# Patient Record
Sex: Female | Born: 1955
Health system: Southern US, Community
[De-identification: ages and names within clinical notes are randomized; demographics above are authoritative.]

## PROBLEM LIST (undated history)

## (undated) DIAGNOSIS — E039 Hypothyroidism, unspecified: Secondary | ICD-10-CM

## (undated) DIAGNOSIS — E669 Obesity, unspecified: Secondary | ICD-10-CM

## (undated) DIAGNOSIS — E119 Type 2 diabetes mellitus without complications: Secondary | ICD-10-CM

## (undated) DIAGNOSIS — I10 Essential (primary) hypertension: Secondary | ICD-10-CM

## (undated) DIAGNOSIS — J40 Bronchitis, not specified as acute or chronic: Secondary | ICD-10-CM

## (undated) DIAGNOSIS — H269 Unspecified cataract: Secondary | ICD-10-CM

## (undated) DIAGNOSIS — M1990 Unspecified osteoarthritis, unspecified site: Secondary | ICD-10-CM

## (undated) DIAGNOSIS — B0229 Other postherpetic nervous system involvement: Secondary | ICD-10-CM

## (undated) DIAGNOSIS — E559 Vitamin D deficiency, unspecified: Secondary | ICD-10-CM

## (undated) DIAGNOSIS — E079 Disorder of thyroid, unspecified: Secondary | ICD-10-CM

## (undated) DIAGNOSIS — E785 Hyperlipidemia, unspecified: Secondary | ICD-10-CM

## (undated) HISTORY — DX: Unspecified osteoarthritis, unspecified site: M19.90

## (undated) HISTORY — DX: Unspecified cataract: H26.9

## (undated) HISTORY — PX: BREAST SURGERY: SHX581

## (undated) HISTORY — DX: Other postherpetic nervous system involvement: B02.29

## (undated) HISTORY — PX: ABDOMINAL HYSTERECTOMY: SHX81

## (undated) HISTORY — DX: Hyperlipidemia, unspecified: E78.5

---
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
## 2000-10-14 ENCOUNTER — Emergency Department (HOSPITAL_COMMUNITY): Admission: EM | Admit: 2000-10-14 | Discharge: 2000-10-14 | Payer: Self-pay | Admitting: *Deleted

## 2001-06-20 ENCOUNTER — Emergency Department (HOSPITAL_COMMUNITY): Admission: EM | Admit: 2001-06-20 | Discharge: 2001-06-20 | Payer: Self-pay | Admitting: Emergency Medicine

## 2001-10-16 ENCOUNTER — Emergency Department (HOSPITAL_COMMUNITY): Admission: EM | Admit: 2001-10-16 | Discharge: 2001-10-16 | Payer: Self-pay | Admitting: Emergency Medicine

## 2002-07-21 ENCOUNTER — Emergency Department (HOSPITAL_COMMUNITY): Admission: EM | Admit: 2002-07-21 | Discharge: 2002-07-21 | Payer: Self-pay | Admitting: Emergency Medicine

## 2002-12-30 ENCOUNTER — Emergency Department (HOSPITAL_COMMUNITY): Admission: EM | Admit: 2002-12-30 | Discharge: 2002-12-30 | Payer: Self-pay | Admitting: Emergency Medicine

## 2003-01-27 ENCOUNTER — Emergency Department (HOSPITAL_COMMUNITY): Admission: EM | Admit: 2003-01-27 | Discharge: 2003-01-27 | Payer: Self-pay | Admitting: Emergency Medicine

## 2003-01-27 ENCOUNTER — Encounter: Payer: Self-pay | Admitting: Emergency Medicine

## 2004-01-17 ENCOUNTER — Emergency Department (HOSPITAL_COMMUNITY): Admission: EM | Admit: 2004-01-17 | Discharge: 2004-01-17 | Payer: Self-pay | Admitting: Emergency Medicine

## 2004-09-11 ENCOUNTER — Emergency Department (HOSPITAL_COMMUNITY): Admission: EM | Admit: 2004-09-11 | Discharge: 2004-09-11 | Payer: Self-pay | Admitting: Emergency Medicine

## 2006-05-05 ENCOUNTER — Emergency Department (HOSPITAL_COMMUNITY): Admission: EM | Admit: 2006-05-05 | Discharge: 2006-05-05 | Payer: Self-pay | Admitting: Emergency Medicine

## 2006-10-24 ENCOUNTER — Emergency Department (HOSPITAL_COMMUNITY): Admission: EM | Admit: 2006-10-24 | Discharge: 2006-10-24 | Payer: Self-pay | Admitting: Emergency Medicine

## 2008-04-22 ENCOUNTER — Encounter (HOSPITAL_COMMUNITY): Admission: RE | Admit: 2008-04-22 | Discharge: 2008-05-22 | Payer: Self-pay | Admitting: Family Medicine

## 2009-01-17 ENCOUNTER — Inpatient Hospital Stay (HOSPITAL_COMMUNITY): Admission: RE | Admit: 2009-01-17 | Discharge: 2009-01-20 | Payer: Self-pay | Admitting: *Deleted

## 2009-01-17 ENCOUNTER — Ambulatory Visit: Payer: Self-pay | Admitting: *Deleted

## 2009-01-17 ENCOUNTER — Emergency Department (HOSPITAL_COMMUNITY): Admission: EM | Admit: 2009-01-17 | Discharge: 2009-01-17 | Payer: Self-pay | Admitting: Emergency Medicine

## 2009-03-15 ENCOUNTER — Emergency Department (HOSPITAL_COMMUNITY): Admission: EM | Admit: 2009-03-15 | Discharge: 2009-03-15 | Payer: Self-pay | Admitting: Emergency Medicine

## 2010-03-01 ENCOUNTER — Emergency Department (HOSPITAL_COMMUNITY)
Admission: EM | Admit: 2010-03-01 | Discharge: 2010-03-01 | Payer: Self-pay | Source: Home / Self Care | Admitting: Emergency Medicine

## 2010-05-09 ENCOUNTER — Ambulatory Visit (HOSPITAL_COMMUNITY)
Admission: RE | Admit: 2010-05-09 | Discharge: 2010-05-09 | Payer: Self-pay | Source: Home / Self Care | Attending: Family Medicine | Admitting: Family Medicine

## 2010-05-24 ENCOUNTER — Ambulatory Visit (HOSPITAL_COMMUNITY)
Admission: RE | Admit: 2010-05-24 | Discharge: 2010-05-24 | Payer: Self-pay | Source: Home / Self Care | Attending: Family Medicine | Admitting: Family Medicine

## 2010-05-29 ENCOUNTER — Ambulatory Visit (HOSPITAL_COMMUNITY)
Admission: RE | Admit: 2010-05-29 | Discharge: 2010-05-29 | Payer: Self-pay | Source: Home / Self Care | Attending: Family Medicine | Admitting: Family Medicine

## 2010-08-26 LAB — URINE MICROSCOPIC-ADD ON

## 2010-08-26 LAB — HEPATIC FUNCTION PANEL
Albumin: 3.4 g/dL — ABNORMAL LOW (ref 3.5–5.2)
Total Protein: 7.7 g/dL (ref 6.0–8.3)

## 2010-08-26 LAB — DIFFERENTIAL
Basophils Absolute: 0 10*3/uL (ref 0.0–0.1)
Basophils Relative: 1 % (ref 0–1)
Monocytes Relative: 9 % (ref 3–12)
Neutro Abs: 3.2 10*3/uL (ref 1.7–7.7)
Neutrophils Relative %: 44 % (ref 43–77)

## 2010-08-26 LAB — URINALYSIS, ROUTINE W REFLEX MICROSCOPIC
Nitrite: NEGATIVE
Protein, ur: NEGATIVE mg/dL
Urobilinogen, UA: 0.2 mg/dL (ref 0.0–1.0)

## 2010-08-26 LAB — POCT I-STAT, CHEM 8
BUN: 10 mg/dL (ref 6–23)
Chloride: 104 mEq/L (ref 96–112)
Potassium: 3.8 mEq/L (ref 3.5–5.1)
Sodium: 140 mEq/L (ref 135–145)

## 2010-08-26 LAB — RAPID URINE DRUG SCREEN, HOSP PERFORMED
Amphetamines: NOT DETECTED
Opiates: NOT DETECTED
Tetrahydrocannabinol: NOT DETECTED

## 2010-08-26 LAB — SALICYLATE LEVEL: Salicylate Lvl: <4 mg/dL (ref 2.8–20.0)

## 2010-08-26 LAB — CBC
MCHC: 33.3 g/dL (ref 30.0–36.0)
RBC: 4.91 MIL/uL (ref 3.87–5.11)
RDW: 13.3 % (ref 11.5–15.5)

## 2010-10-03 NOTE — H&P (Signed)
NAMEMarland Kitchen  Jeanette Compton, Jeanette Compton                ACCOUNT NO.:  [id number]   MEDICAL RECORD NO.:  [id number]          PATIENT TYPE:  IPS   LOCATION:  0302                          FACILITY:  [facility]   PHYSICIAN:  Jasmine Pang, M.D. DATE OF BIRTH:  10-06-1955   DATE OF ADMISSION:  01/17/2009  DATE OF DISCHARGE:                       PSYCHIATRIC ADMISSION ASSESSMENT   TIME:  10:05 a.m.   IDENTIFYING INFORMATION:  A 55 year old female.  This is a voluntary  admission.   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  Came to the emergency room yesterday because one of her  friends brought her, saying that she needed some help.  Apparently her  speech was somewhat slurred and she was rather somnolent in triage.  There were concerns about her medication management.  This morning she  is fully alert, in no distress.  Denies any misuse of her medication.  Denies any use of alcohol or street drugs.  She admits to having  depression and some fleeting suicidal thoughts occasionally.  Says she  has no intent to harm herself.  Does not feel that her depression is  that bad, not sure she needs to be here.   PAST PSYCHIATRIC HISTORY:  Followed at [facility] in  [place], [place] for about 3-4 months now.  Was started on  Zoloft 50 mg daily along with Remeron 30 mg daily and seen last week,  with Zoloft increased to 100 mg daily.  Also started on Gabapentin 300  mg t.i.d. for her anxiety.  This is her first episode of treatment.  No  previous hospitalizations.  No previous suicide attempts.  No previous  history of psychotropics.  Cites onset of depression after becoming  unemployed in 2008 due to on-the-job injury.   SOCIAL HISTORY:  A 55 year old African American female previously  married 3 times.  Divorced now since 2009.  Has been living with her  daughter since she fell twice at  work in 2008.  No income or work since  2008.  Has applied for disability and but not yet awarded.  Educated  through the tenth grade.  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  She reports that she is very active in her church,  attends weekly, and this is her primary support for her depression.   FAMILY HISTORY:  Two brothers who committed suicide.   MEDICAL HISTORY:  No regular primary care Jeanette Compton.   MEDICAL PROBLEMS:  Chronic back pain, vitiligo.   PAST MEDICAL HISTORY:  Hysterectomy, bronchitis, questionable history of  thyroid disorder.   CURRENT MEDICATIONS:  Gabapentin 300 mg t.i.d., Remeron 30 mg q.h.s.,  Zoloft 100 mg daily, also taken at h.s. due to drowsiness.   DRUG ALLERGIES:  None.   PHYSICAL EXAM:  Was done in the emergency room, as noted in the record,  a fully alert female with normal vital signs.   Chemistry panel  unremarkable.  Urine drug screen remarkable for a  moderate hemoglobin, RBCs 3-6, epithelials and bacteria rare.  Alcohol  level less than 5.  CBC normal.  Urine drug screen is negative.   MENTAL STATUS EXAM:  A fully alert female.  Adequate eye contact.  Grooming is good.  Makeup is on.  She is still dressed in hospital  scrubs from the emergency room.  Initially she is in bed.  Moves slowly  with log-rolling position to guard her back.  Gait is stable.  Motor is  normal.  Speech nonpressured.  Gives a coherent history.  Mood is  depressed.  Admits that she has depressed mood, low energy, does not  feel like she can do anything.  Reports that she is not doing much  around the house, sits all day long.  Admits to having some fleeting  suicidal thoughts in the past but has not thought of suicide recently.  No concerns that she would harm herself.  Cognition completely  preserved.  Insight is adequate.  Memory intact.   DIAGNOSES:  AXIS I:  Depressive  disorder not otherwise specified.  AXIS II:  No diagnosis.  AXIS III:  Chronic back pain.  Vitiligo.  AXIS IV:  Severe issues with unemployment, financial pressures.  AXIS V:  Current is 59.  Past year 53 estimated.   PLAN:  Voluntarily admit her to evaluate her mood and suicidality.  We  are not quite sure what is going on with her at this point.  We are  going to contact her daughter see if there are any concerns about her  safety at home, and will discharge her to continue with outpatient  followup with [facility] if her daughter feels that she is safe to go.  Meanwhile, will continue to evaluate her level of consciousness.  She  has been fully alert here with no slurred speech.      Margaret A. Lorin Picket, N.P.      Jasmine Pang, M.D.  Electronically Signed    MAS/MEDQ  D:  01/18/2009  T:  01/18/2009  Job:  [id number]

## 2010-10-03 NOTE — Discharge Summary (Signed)
NAME:  Jeanette Compton, Jeanette Compton NO.:  [id number]   MEDICAL RECORD NO.:  [id number]          PATIENT TYPE:  IPS   LOCATION:  0302                          FACILITY:  [facility]   PHYSICIAN:  Jasmine Pang, M.D. DATE OF BIRTH:  [birth year]   DATE OF ADMISSION:  01/17/2009  DATE OF DISCHARGE:  01/20/2009                               DISCHARGE SUMMARY   IDENTIFYING INFORMATION:  This is a 55 year old divorced African  American female who was admitted on a voluntary basis on January 17, 2009.   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  She came into the emergency room on the day before  admission because one of her friends brought her, saying that she needed  some help.  Apparently, her speech was somewhat slurred and she was  rather somnolent in triage.  There were concerns about her medication  management.  This morning, she is fully alert and in no acute distress.  She denies any misuse of her medication.  She denies any use of alcohol  or street drugs.  She admits to having depression and some fleeting  suicidal thoughts occasionally.  She says she has no intent to harm  herself.  She does not feel that her depression is that bad.  She is  not sure she needs to be here.  She is followed at [facility] in [place], [place].  She has been there for about 3-4  months.  She was started on Zoloft 50 mg daily along with Remeron 30 mg  daily.  She was also started on gabapentin 300 mg t.i.d. for her  anxiety.  This is her first episode of treatment.  No previous  hospitalizations.  No previous suicide attempts.  No previous history of  psychotropics.  She cites onset of depression after becoming unemployed  in 2008 due to on the job injury.  For further admission information,  see psychiatric admission assessment. On admission, she was given a  depressive disorder, NOS diagnosis on Axis I.  On Axis III, she was  given a chronic back pain and vitiligo diagnosis.   PHYSICAL FINDINGS:  Physical exam was done in the emergency room and  noted in the record.  This is a fully alert female, who was normal.   DIAGNOSTIC STUDIES:  Chemistry panel was unremarkable.  Urine drug  screen was remarkable for moderate hemoglobin.  RBCs were 3 to 9.  Epithelial cells and bacteria were rare.  Alcohol level was less than 5.  CBC was normal.  Urine drug screen was negative.   HOSPITAL COURSE:  Upon admission, the patient was started on gabapentin  300 mg p.o. q.i.d. and Remeron 30 mg q.h.s.  Her Zoloft was increased to  100 mg p.o. q. day.  In individual sessions, the patient was initially  disheveled with fair eye contact.  There was psychomotor retardation.  Speech was soft and slow.  She was drowsy and lethargic, and lying in  bed  when I first met her.  Mood was depressed and anxious.  Affect was  consistent with mood.  There was passive suicidal ideation.  No  homicidal ideation.  No evidence of psychosis or thought disorder.  As  hospitalization progressed, the patient became somewhat more active and  began to participate in unit therapeutic groups and therapy.  On  January 19, 2009, she was less depressed and less anxious.  Her  daughter had visited the night before and was very supportive.  She  lives with her daughter and feels comfortable returning to home.  On  January 20, 2009, sleep was good.  She was dressed casually and neatly  with good eye contact.  Speech was normal rate and flow.  Psychomotor  activity was within normal limits.  Mood was less depressed and less  anxious.  Affect was consistent with mood.  There was no suicidal or  homicidal ideation.  No thoughts of self injurious behavior.  No  auditory or visual hallucinations.  No paranoia or delusions.  Thoughts  were logical and goal-directed.  Thought content, no  predominant theme.  Cognitive was grossly intact.  Insight good.  Judgment good.  Impulse  control good and the patient was having no side effects to her  medication.  She wanted to go home today to live with her daughter and  she was felt to be safe for discharge.   DISCHARGE DIAGNOSES:  Axis I:  Depressive disorder, not otherwise  specified.  Axis II: None.  Axis III: Chronic back pain, vitiligo.  Axis IV: Severe (issues with unemployment, financial pressures, burden  of psychiatric illness, burden of medical problems).  Axis V:  Global assessment of functioning 50 upon discharge.  Global  assessment of functioning was 35 upon admission.  Global assessment of  functioning highest past year was 66.   DISCHARGE PLAN:  There were no specific activity level or dietary  restrictions.   POSTHOSPITAL CARE PLANS:  The patient will go to [facility] [place] on  January 25, 2009 at 8 o'clock a.m.   DISCHARGE MEDICATIONS:  1. Neurontin 300 mg 4 times daily.  2. Zoloft 100 mg daily.  3. Remeron 30 mg at bedtime.      Jasmine Pang, M.D.  Electronically Signed     BHS/MEDQ  D:  01/20/2009  T:  01/21/2009  Job:  [id number]

## 2010-11-03 ENCOUNTER — Other Ambulatory Visit (HOSPITAL_COMMUNITY): Payer: Self-pay | Admitting: Family Medicine

## 2010-11-03 DIAGNOSIS — Z09 Encounter for follow-up examination after completed treatment for conditions other than malignant neoplasm: Secondary | ICD-10-CM

## 2010-12-06 ENCOUNTER — Ambulatory Visit (HOSPITAL_COMMUNITY)
Admission: RE | Admit: 2010-12-06 | Discharge: 2010-12-06 | Disposition: A | Discharge status: Home / Self Care | Payer: Medicaid Other | Source: Ambulatory Visit | Attending: Family Medicine | Admitting: Family Medicine

## 2010-12-06 DIAGNOSIS — N6489 Other specified disorders of breast: Secondary | ICD-10-CM | POA: Insufficient documentation

## 2010-12-06 DIAGNOSIS — Z09 Encounter for follow-up examination after completed treatment for conditions other than malignant neoplasm: Secondary | ICD-10-CM | POA: Insufficient documentation

## 2011-01-19 ENCOUNTER — Emergency Department (HOSPITAL_COMMUNITY)
Admission: EM | Admit: 2011-01-19 | Discharge: 2011-01-19 | Disposition: A | Discharge status: Home / Self Care | Payer: Medicaid Other | Attending: Emergency Medicine | Admitting: Emergency Medicine

## 2011-01-19 ENCOUNTER — Emergency Department (HOSPITAL_COMMUNITY): Payer: Medicaid Other

## 2011-01-19 ENCOUNTER — Encounter: Payer: Self-pay | Admitting: *Deleted

## 2011-01-19 DIAGNOSIS — I10 Essential (primary) hypertension: Secondary | ICD-10-CM | POA: Insufficient documentation

## 2011-01-19 DIAGNOSIS — E0590 Thyrotoxicosis, unspecified without thyrotoxic crisis or storm: Secondary | ICD-10-CM | POA: Insufficient documentation

## 2011-01-19 DIAGNOSIS — J40 Bronchitis, not specified as acute or chronic: Secondary | ICD-10-CM | POA: Insufficient documentation

## 2011-01-19 DIAGNOSIS — J189 Pneumonia, unspecified organism: Secondary | ICD-10-CM | POA: Insufficient documentation

## 2011-01-19 HISTORY — DX: Bronchitis, not specified as acute or chronic: J40

## 2011-01-19 HISTORY — DX: Essential (primary) hypertension: I10

## 2011-01-19 MED ORDER — AZITHROMYCIN 250 MG PO TABS: ORAL_TABLET | ORAL | Status: DC

## 2011-01-19 MED ORDER — AZITHROMYCIN 250 MG PO TABS
500.0000 mg | ORAL_TABLET | Freq: Once | ORAL | Status: AC
  Administered 2011-01-19: 500 mg via ORAL
  Filled 2011-01-19: qty 2

## 2011-01-19 MED ORDER — HYDROCOD POLST-CHLORPHEN POLST 10-8 MG/5ML PO LQCR
5.0000 mL | Freq: Once | ORAL | Status: AC
  Administered 2011-01-19: 5 mL via ORAL

## 2011-01-19 MED ORDER — HYDROCOD POLST-CHLORPHEN POLST 10-8 MG/5ML PO LQCR
ORAL | Status: AC
  Filled 2011-01-19: qty 5

## 2011-01-19 MED ORDER — CEFTRIAXONE SODIUM 1 G IJ SOLR
1.0000 g | Freq: Once | INTRAMUSCULAR | Status: AC
  Administered 2011-01-19: 1 g via INTRAMUSCULAR
  Filled 2011-01-19: qty 1

## 2011-01-19 MED ORDER — HYDROCOD POLST-CHLORPHEN POLST 10-8 MG/5ML PO LQCR: 5.0000 mL | Freq: Two times a day (BID) | ORAL | Status: DC

## 2011-01-19 NOTE — ED Provider Notes (Signed)
History     CSN: [id number] Arrival date & time: 01/19/2011  8:32 PM  Chief Complaint  Patient presents with  . Cough   Patient is a 55 y.o. female presenting with cough. The history is provided by the patient. No language interpreter was used.  Cough This is a new problem. The current episode started 2 days ago. The problem occurs constantly. The problem has been gradually worsening. The cough is non-productive. The maximum temperature recorded prior to her arrival was 100 to 100.9 F. The fever has been present for 1 to 2 days. Associated symptoms include chest pain and shortness of breath.    Past Medical History  Diagnosis Date  . Hypertension   . Bronchitis   . Hyperthyroidism     Past Surgical History  Procedure Date  . Abdominal hysterectomy   . Breast surgery     No family history on file.  History  Substance Use Topics  . Smoking status: Never Smoker   . Smokeless tobacco: Not on file  . Alcohol Use: No    OB History    Grav Para Term Preterm Abortions TAB SAB Ect Mult Living                  Review of Systems  Constitutional: Positive for fever and fatigue.  Respiratory: Positive for cough and shortness of breath.   Cardiovascular: Positive for chest pain.    Physical Exam  BP 146/84  Pulse 81  Temp(Src) 100.7 F (38.2 C) (Oral)  Resp 20  Ht 5\' 6"  (1.676 m)  Wt 198 lb (89.812 kg)  BMI 31.96 kg/m2  SpO2 99%  Physical Exam  Nursing note and vitals reviewed. Constitutional: She is oriented to person, place, and time. Vital signs are normal. She appears well-developed and well-nourished. No distress.  HENT:  Head: Normocephalic and atraumatic.  Right Ear: External ear normal.  Left Ear: External ear normal.  Nose: Nose normal.  Mouth/Throat: No oropharyngeal exudate.  Eyes: Conjunctivae and EOM are normal. Pupils are equal, round, and reactive to light. Right eye exhibits no discharge. Left eye exhibits no discharge. No scleral icterus.  Neck:  Normal range of motion. Neck supple. No JVD present. No tracheal deviation present. No thyromegaly present.  Cardiovascular: Normal rate, regular rhythm, normal heart sounds, intact distal pulses and normal pulses.  Exam reveals no gallop and no friction rub.   No murmur heard. Pulmonary/Chest: Effort normal. No stridor. No respiratory distress. She has no wheezes. She has no rales.   She exhibits no tenderness.       Rhonchi at R base.  Abdominal: Soft. Normal appearance and bowel sounds are normal. She exhibits no distension and no mass. There is no tenderness. There is no rebound and no guarding.  Musculoskeletal: Normal range of motion. She exhibits no edema and no tenderness.       Arms: Lymphadenopathy:    She has no cervical adenopathy.  Neurological: She is alert and oriented to person, place, and time. She has normal reflexes. No cranial nerve deficit. Coordination normal. GCS eye subscore is 4. GCS verbal subscore is 5. GCS motor subscore is 6.  Skin: Skin is warm and dry. No rash noted. She is not diaphoretic.  Psychiatric: She has a normal mood and affect. Her speech is normal and behavior is normal. Judgment and thought content normal. Cognition and memory are normal.    ED Course  Procedures  MDM       Worthy Rancher,  PA 01/19/11 2221

## 2011-01-19 NOTE — ED Notes (Signed)
Productive cough for 2 days with chest pains when she coughs

## 2011-01-20 NOTE — ED Provider Notes (Signed)
Medical screening examination/treatment/procedure(s) were performed by non-physician practitioner and as supervising physician I was immediately available for consultation/collaboration.  Glynn Octave, MD 01/20/11 [phone]

## 2011-03-08 LAB — URINE MICROSCOPIC-ADD ON

## 2011-03-08 LAB — URINALYSIS, ROUTINE W REFLEX MICROSCOPIC
Bilirubin Urine: NEGATIVE
Glucose, UA: NEGATIVE
Ketones, ur: NEGATIVE
Nitrite: POSITIVE — AB
Protein, ur: NEGATIVE
Specific Gravity, Urine: 1.023
Urobilinogen, UA: 0.2
pH: 5.5

## 2011-03-08 LAB — I-STAT 8, (EC8 V) (CONVERTED LAB)
Acid-base deficit: 1
BUN: 11
Bicarbonate: 24.7 — ABNORMAL HIGH
Chloride: 108
Glucose, Bld: 111 — ABNORMAL HIGH
HCT: 43
Hemoglobin: 14.6
Operator id: 146091
Potassium: 3.7
Sodium: 140
TCO2: 26
pCO2, Ven: 43.4 — ABNORMAL LOW
pH, Ven: 7.363 — ABNORMAL HIGH

## 2011-03-08 LAB — POCT I-STAT CREATININE
Creatinine, Ser: 0.7
Operator id: 146091

## 2011-04-03 ENCOUNTER — Other Ambulatory Visit (HOSPITAL_COMMUNITY): Payer: Self-pay | Admitting: Family Medicine

## 2011-04-03 DIAGNOSIS — Z139 Encounter for screening, unspecified: Secondary | ICD-10-CM

## 2011-05-18 ENCOUNTER — Ambulatory Visit (HOSPITAL_COMMUNITY)
Admission: RE | Admit: 2011-05-18 | Discharge: 2011-05-18 | Disposition: A | Discharge status: Home / Self Care | Payer: Medicare Other | Source: Ambulatory Visit | Attending: Family Medicine | Admitting: Family Medicine

## 2011-05-18 DIAGNOSIS — Z1231 Encounter for screening mammogram for malignant neoplasm of breast: Secondary | ICD-10-CM | POA: Insufficient documentation

## 2011-05-18 DIAGNOSIS — Z139 Encounter for screening, unspecified: Secondary | ICD-10-CM

## 2011-07-18 ENCOUNTER — Encounter (INDEPENDENT_AMBULATORY_CARE_PROVIDER_SITE_OTHER): Payer: Self-pay | Admitting: *Deleted

## 2011-07-25 ENCOUNTER — Encounter (INDEPENDENT_AMBULATORY_CARE_PROVIDER_SITE_OTHER): Payer: Self-pay | Admitting: *Deleted

## 2011-07-25 ENCOUNTER — Other Ambulatory Visit (INDEPENDENT_AMBULATORY_CARE_PROVIDER_SITE_OTHER): Payer: Self-pay | Admitting: *Deleted

## 2011-07-25 ENCOUNTER — Telehealth (INDEPENDENT_AMBULATORY_CARE_PROVIDER_SITE_OTHER): Payer: Self-pay | Admitting: *Deleted

## 2011-07-25 DIAGNOSIS — Z1211 Encounter for screening for malignant neoplasm of colon: Secondary | ICD-10-CM

## 2011-07-25 MED ORDER — PEG-KCL-NACL-NASULF-NA ASC-C 100 G PO SOLR: 1.0000 | Freq: Once | ORAL | Status: DC

## 2011-07-25 NOTE — Telephone Encounter (Signed)
Patient needs movi prep 

## 2011-08-27 ENCOUNTER — Telehealth (INDEPENDENT_AMBULATORY_CARE_PROVIDER_SITE_OTHER): Payer: Self-pay | Admitting: *Deleted

## 2011-08-27 NOTE — Telephone Encounter (Signed)
Requesting MD: Janna Arch  PCP:       Name & DOB: Marlaine Hind  Jun 23, 2055     Procedure: tcs  Reason/Indication:  screening  Has patient had this procedure before?  no  If so, when, by whom and where?    Is there a family history of colon cancer?  no  Who?  What age when diagnosed?    Is patient diabetic?   no      Does patient have prosthetic heart valve?  no  Do you have a pacemaker?  no  Has patient had joint replacement within last 12 months?  no  Is patient on Coumadin, Plavix and/or Aspirin? no  Medications: lisinopril/hctz 20/12.5 mg daily, travastatin 40 mg daily  Allergies: nkda  Pharmacy: [facility]  Medication Adjustment:   Procedure date & time: 09/13/11 at 1030

## 2011-08-29 NOTE — Telephone Encounter (Signed)
agree

## 2011-09-12 MED ORDER — SODIUM CHLORIDE 0.45 % IV SOLN
Freq: Once | INTRAVENOUS | Status: AC
  Administered 2011-09-13: 10:00:00 via INTRAVENOUS

## 2011-09-13 ENCOUNTER — Ambulatory Visit (HOSPITAL_COMMUNITY)
Admission: RE | Admit: 2011-09-13 | Discharge: 2011-09-13 | Disposition: A | Discharge status: Home / Self Care | Payer: Medicare Other | Source: Ambulatory Visit | Attending: Internal Medicine | Admitting: Internal Medicine

## 2011-09-13 ENCOUNTER — Encounter (HOSPITAL_COMMUNITY): Payer: Self-pay | Admitting: *Deleted

## 2011-09-13 ENCOUNTER — Encounter (HOSPITAL_COMMUNITY)
Admission: RE | Disposition: A | Discharge status: Home / Self Care | Payer: Self-pay | Source: Ambulatory Visit | Attending: Internal Medicine

## 2011-09-13 DIAGNOSIS — D126 Benign neoplasm of colon, unspecified: Secondary | ICD-10-CM

## 2011-09-13 DIAGNOSIS — Z79899 Other long term (current) drug therapy: Secondary | ICD-10-CM | POA: Insufficient documentation

## 2011-09-13 DIAGNOSIS — Z1211 Encounter for screening for malignant neoplasm of colon: Secondary | ICD-10-CM | POA: Insufficient documentation

## 2011-09-13 DIAGNOSIS — K5730 Diverticulosis of large intestine without perforation or abscess without bleeding: Secondary | ICD-10-CM | POA: Insufficient documentation

## 2011-09-13 DIAGNOSIS — I10 Essential (primary) hypertension: Secondary | ICD-10-CM | POA: Insufficient documentation

## 2011-09-13 HISTORY — PX: COLONOSCOPY: SHX5424

## 2011-09-13 SURGERY — COLONOSCOPY
Anesthesia: Moderate Sedation

## 2011-09-13 MED ORDER — MEPERIDINE HCL 50 MG/ML IJ SOLN
INTRAMUSCULAR | Status: DC | PRN
  Administered 2011-09-13 (×2): 25 mg via INTRAVENOUS

## 2011-09-13 MED ORDER — STERILE WATER FOR IRRIGATION IR SOLN
Status: DC | PRN
  Administered 2011-09-13: 11:00:00

## 2011-09-13 MED ORDER — MIDAZOLAM HCL 5 MG/5ML IJ SOLN
INTRAMUSCULAR | Status: AC
  Filled 2011-09-13: qty 10

## 2011-09-13 MED ORDER — MEPERIDINE HCL 50 MG/ML IJ SOLN
INTRAMUSCULAR | Status: AC
  Filled 2011-09-13: qty 1

## 2011-09-13 MED ORDER — MIDAZOLAM HCL 5 MG/5ML IJ SOLN
INTRAMUSCULAR | Status: DC | PRN
  Administered 2011-09-13 (×2): 2 mg via INTRAVENOUS
  Administered 2011-09-13: 1 mg via INTRAVENOUS
  Administered 2011-09-13: 2 mg via INTRAVENOUS

## 2011-09-13 NOTE — Op Note (Signed)
COLONOSCOPY PROCEDURE REPORT  PATIENT:  Jeanette Compton  MR#:  [id number] Birthdate:  1955-07-16, 56 y.o., female Endoscopist:  Dr. Malissa Hippo, MD Referred By:  Dr. Isabella Stalling,, M.D. Procedure Date: 09/13/2011  Procedure:   Colonoscopy with snare polypectomy.  Indications:  screening for colon cancer.  Informed Consent:  The procedure and risks were reviewed with the patient and informed consent was obtained.  Medications:  Demerol 50 mg IV Versed 7 mg IV  Description of procedure:  After a digital rectal exam was performed, that colonoscope was advanced from the anus through the rectum and colon to the area of the cecum, ileocecal valve and appendiceal orifice. The cecum was deeply intubated. These structures were well-seen and photographed for the record. From the level of the cecum and ileocecal valve, the scope was slowly and cautiously withdrawn. The mucosal surfaces were carefully surveyed utilizing scope tip to flexion to facilitate fold flattening as needed. The scope was pulled down into the rectum where a thorough exam including retroflexion was performed.  Findings:   Prep satisfactory. 8 mm polyp snared from ascending colon. We millimeter polyp at hepatic flexure was coagulated using snare tip. 2 diverticula noted at the ascending colon. Normal rectal mucosa and single small anal papilla.  Therapeutic/Diagnostic Maneuvers Performed:  See above  Complications:  None  Cecal Withdrawal Time:  14 minutes  Impression:  Examination performed to cecum. 8 mm  polyp snared from ascending colon. Small polyp hepatic flexure was coagulated. Twio diverticula at  ascending colon  Recommendations:  Standard instructions given. I would be contacting patient with results of biopsy and further recommendations.   Conley Delisle U  09/13/2011 12:03 PM  CC: Dr. Isabella Stalling, MD, MD & Dr. Bonnetta Barry ref. provider found

## 2011-09-13 NOTE — Op Note (Signed)
COLONOSCOPY PROCEDURE REPORT  PATIENT:  Jeanette Compton  MR#:  [id number] Birthdate:  [birth year], 56 y.o., female Endoscopist:  Dr. Malissa Hippo, MD Referred By:  Dr. Isabella Stalling,, M.D. Procedure Date: 09/13/2011  Procedure:   Colonoscopy with snare polypectomy.  Indications:  screening for colon cancer.  Informed Consent:  The procedure and risks were reviewed with the patient and informed consent was obtained.  Medications:  Demerol 50 mg IV Versed 7 mg IV  Description of procedure:  After a digital rectal exam was performed, that colonoscope was advanced from the anus through the rectum and colon to the area of the cecum, ileocecal valve and appendiceal orifice. The cecum was deeply intubated. These structures were well-seen and photographed for the record. From the level of the cecum and ileocecal valve, the scope was slowly and cautiously withdrawn. The mucosal surfaces were carefully surveyed utilizing scope tip to flexion to facilitate fold flattening as needed. The scope was pulled down into the rectum where a thorough exam including retroflexion was performed.  Findings:   Prep satisfactory. 8 mm polyp snared from ascending colon. We millimeter polyp at hepatic flexure was coagulated using snare tip. 2 diverticula noted at the ascending colon. Normal rectal mucosa and single small anal papilla.  Therapeutic/Diagnostic Maneuvers Performed:  See above  Complications:  None  Cecal Withdrawal Time:  14 minutes  Impression:  Examination performed to cecum. 8 mm  polyp snared from ascending colon. Small polyp hepatic flexure was coagulated. Twio diverticula at the ascending colon  Recommendations:  Standard instructions given. I would be contacting patient with results of biopsy and further recommendations.   [name]  09/13/2011 11:53 AM  CC: Dr. Isabella Stalling, MD, MD & Dr. Bonnetta Barry ref. provider found

## 2011-09-13 NOTE — Discharge Instructions (Signed)
No aspirin for one week. Resume medications and diet. No driving for 24 hours. Physician and contact you with the biopsy results.Diverticulosis Diverticulosis is a common condition that develops when small pouches (diverticula) form in the wall of the colon. The risk of diverticulosis increases with age. It happens more often in people who eat a low-fiber diet. Most individuals with diverticulosis have no symptoms. Those individuals with symptoms usually experience abdominal pain, constipation, or loose stools (diarrhea). HOME CARE INSTRUCTIONS   Increase the amount of fiber in your diet as directed by your caregiver or dietician. This may reduce symptoms of diverticulosis.   Your caregiver may recommend taking a dietary fiber supplement.   Drink at least 6 to 8 glasses of water each day to prevent constipation.   Try not to strain when you have a bowel movement.   Your caregiver may recommend avoiding nuts and seeds to prevent complications, although this is still an uncertain benefit.   Only take over-the-counter or prescription medicines for pain, discomfort, or fever as directed by your caregiver.  FOODS WITH HIGH FIBER CONTENT INCLUDE:  Fruits. Apple, peach, pear, tangerine, raisins, prunes.   Vegetables. Brussels sprouts, asparagus, broccoli, cabbage, carrot, cauliflower, romaine lettuce, spinach, summer squash, tomato, winter squash, zucchini.   Starchy Vegetables. Baked beans, kidney beans, lima beans, split peas, lentils, potatoes (with skin).   Grains. Whole wheat bread, brown rice, bran flake cereal, plain oatmeal, white rice, shredded wheat, bran muffins.  SEEK IMMEDIATE MEDICAL CARE IF:   You develop increasing pain or severe bloating.   You have an oral temperature above 102 F (38.9 C), not controlled by medicine.   You develop vomiting or bowel movements that are bloody or black.  Document Released: 02/02/2004 Document Revised: 04/26/2011 Document Reviewed:  10/05/2009 [facility] Patient Information 2012 [place], [place].Colon Polyps A polyp is extra tissue that grows inside your body. Colon polyps grow in the large intestine. The large intestine, also called the colon, is part of your digestive system. It is a long, hollow tube at the end of your digestive tract where your body makes and stores stool. Most polyps are not dangerous. They are benign. This means they are not cancerous. But over time, some types of polyps can turn into cancer. Polyps that are smaller than a pea are usually not harmful. But larger polyps could someday become or may already be cancerous. To be safe, doctors remove all polyps and test them.  WHO GETS POLYPS? Anyone can get polyps, but certain people are more likely than others. You may have a greater chance of getting polyps if:  You are over 50.   You have had polyps before.   Someone in your family has had polyps.   Someone in your family has had cancer of the large intestine.   Find out if someone in your family has had polyps. You may also be more likely to get polyps if you:   Eat a lot of fatty foods.   Smoke.   Drink alcohol.   Do not exercise.   Eat too much.  SYMPTOMS  Most small polyps do not cause symptoms. People often do not know they have one until their caregiver finds it during a regular checkup or while testing them for something else. Some people do have symptoms like these:  Bleeding from the anus. You might notice blood on your underwear or on toilet paper after you have had a bowel movement.   Constipation or diarrhea that lasts more than  a week.   Blood in the stool. Blood can make stool look black or it can show up as red streaks in the stool.  If you have any of these symptoms, see your caregiver. HOW DOES THE DOCTOR TEST FOR POLYPS? The doctor can use four tests to check for polyps:  Digital rectal exam. The caregiver wears gloves and checks your rectum (the last part of the large  intestine) to see if it feels normal. This test would find polyps only in the rectum. Your caregiver may need to do one of the other tests listed below to find polyps higher up in the intestine.   Barium enema. The caregiver puts a liquid called barium into your rectum before taking x-rays of your large intestine. Barium makes your intestine look white in the pictures. Polyps are dark, so they are easy to see.   Sigmoidoscopy. With this test, the caregiver can see inside your large intestine. A thin flexible tube is placed into your rectum. The device is called a sigmoidoscope, which has a light and a tiny video camera in it. The caregiver uses the sigmoidoscope to look at the last third of your large intestine.   Colonoscopy. This test is like sigmoidoscopy, but the caregiver looks at all of the large intestine. It usually requires sedation. This is the most common method for finding and removing polyps.  TREATMENT   The caregiver will remove the polyp during sigmoidoscopy or colonoscopy. The polyp is then tested for cancer.   If you have had polyps, your caregiver may want you to get tested regularly in the future.  PREVENTION  There is not one sure way to prevent polyps. You might be able to lower your risk of getting them if you:  Eat more fruits and vegetables and less fatty food.   Do not smoke.   Avoid alcohol.   Exercise every day.   Lose weight if you are overweight.   Eating more calcium and folate can also lower your risk of getting polyps. Some foods that are rich in calcium are milk, cheese, and broccoli. Some foods that are rich in folate are chickpeas, kidney beans, and spinach.   Aspirin might help prevent polyps. Studies are under way.  Document Released: 02/01/2004 Document Revised: 04/26/2011 Document Reviewed: 07/09/2007 [facility] Patient Information 2012 [place], [place].

## 2011-09-13 NOTE — H&P (Signed)
Jeanette Compton is an 56 y.o. female.   Chief Complaint: Patient is here for colonoscopy HPI: Patient is 56 year old female who is in for average risk screening colonoscopy. Patient denies abdominal pain change in her bowel habits or rectal bleeding. Family history is positive for colon carcinoma in one second cousin.  Past Medical History  Diagnosis Date  . Hypertension   . Bronchitis   . Hyperthyroidism     Past Surgical History  Procedure Date  . Abdominal hysterectomy   . Breast surgery     Family History  Problem Relation Age of Onset  . Colon cancer Neg Hx    Social History:  reports that she has quit smoking. She does not have any smokeless tobacco history on file. She reports that she does not drink alcohol or use illicit drugs.  Allergies: No Known Allergies  Medications Prior to Admission  Medication Sig Dispense Refill  . LISINOPRIL PO Take 1 tablet by mouth daily.        . Multiple Vitamins-Calcium (ONE-A-DAY WOMENS PO) Take 1 tablet by mouth daily.        . peg 3350 powder (MOVIPREP) 100 G SOLR Take 1 kit (100 g total) by mouth once.  1 kit  0  . azithromycin (ZITHROMAX) 250 MG tablet Take one tab po QD until gone.  (initial dose given in the ED)  4 tablet  0  . chlorpheniramine-HYDROcodone (TUSSIONEX PENNKINETIC ER) 10-8 MG/5ML LQCR Take 5 mLs by mouth every 12 (twelve) hours.  100 mL  0  . guaifenesin (ROBITUSSIN) 100 MG/5ML syrup Take by mouth as needed. For cough        . Throat Lozenges (HALLS COUGH DROPS) LOZG Use as directed 1 lozenge in the mouth or throat as needed.          No results found for this or any previous visit (from the past 48 hour(s)). No results found.  Review of Systems  Constitutional: Negative for weight loss.  Gastrointestinal: Negative for abdominal pain, diarrhea, constipation, blood in stool and melena.    Blood pressure 129/76, pulse 73, temperature 98.3 F (36.8 C), temperature source Oral, resp. rate 18, height 5\' 7"  (1.702  m), weight 205 lb (92.987 kg), SpO2 98.00%. Physical Exam  Constitutional: She appears well-developed and well-nourished.  HENT:  Mouth/Throat: Oropharynx is clear and moist.  Eyes: Conjunctivae are normal. No scleral icterus.  Cardiovascular: Normal rate, regular rhythm and normal heart sounds.   No murmur heard. Respiratory: Effort normal.  Musculoskeletal: She exhibits no edema.  Lymphadenopathy:    She has no cervical adenopathy.  Neurological: She is alert.  Skin: Skin is warm and dry.     Assessment/Plan Average risk screening colonoscopy.  Krystn Dermody U 09/13/2011, 11:17 AM

## 2011-09-18 ENCOUNTER — Encounter (HOSPITAL_COMMUNITY): Payer: Self-pay | Admitting: Internal Medicine

## 2011-09-21 ENCOUNTER — Encounter (INDEPENDENT_AMBULATORY_CARE_PROVIDER_SITE_OTHER): Payer: Self-pay | Admitting: *Deleted

## 2012-04-14 ENCOUNTER — Other Ambulatory Visit (HOSPITAL_COMMUNITY): Payer: Self-pay | Admitting: Family Medicine

## 2012-04-14 DIAGNOSIS — Z139 Encounter for screening, unspecified: Secondary | ICD-10-CM

## 2012-05-20 ENCOUNTER — Ambulatory Visit (HOSPITAL_COMMUNITY)
Admission: RE | Admit: 2012-05-20 | Discharge: 2012-05-20 | Disposition: A | Discharge status: Home / Self Care | Payer: Medicare Other | Source: Ambulatory Visit | Attending: Family Medicine | Admitting: Family Medicine

## 2012-05-20 DIAGNOSIS — Z139 Encounter for screening, unspecified: Secondary | ICD-10-CM

## 2012-05-20 DIAGNOSIS — Z1231 Encounter for screening mammogram for malignant neoplasm of breast: Secondary | ICD-10-CM | POA: Insufficient documentation

## 2012-05-28 ENCOUNTER — Other Ambulatory Visit: Payer: Self-pay | Admitting: Family Medicine

## 2012-05-28 DIAGNOSIS — R928 Other abnormal and inconclusive findings on diagnostic imaging of breast: Secondary | ICD-10-CM

## 2012-06-11 ENCOUNTER — Ambulatory Visit (HOSPITAL_COMMUNITY)
Admission: RE | Admit: 2012-06-11 | Discharge: 2012-06-11 | Disposition: A | Discharge status: Home / Self Care | Payer: Medicare Other | Source: Ambulatory Visit | Attending: Family Medicine | Admitting: Family Medicine

## 2012-06-11 ENCOUNTER — Other Ambulatory Visit: Payer: Self-pay | Admitting: Family Medicine

## 2012-06-11 ENCOUNTER — Other Ambulatory Visit (HOSPITAL_COMMUNITY): Payer: Self-pay | Admitting: Family Medicine

## 2012-06-11 DIAGNOSIS — N63 Unspecified lump in unspecified breast: Secondary | ICD-10-CM | POA: Insufficient documentation

## 2012-06-11 DIAGNOSIS — R928 Other abnormal and inconclusive findings on diagnostic imaging of breast: Secondary | ICD-10-CM

## 2012-06-18 ENCOUNTER — Ambulatory Visit (HOSPITAL_COMMUNITY): Payer: Medicare Other

## 2012-07-02 ENCOUNTER — Other Ambulatory Visit (HOSPITAL_COMMUNITY): Payer: Self-pay | Admitting: Family Medicine

## 2012-07-02 ENCOUNTER — Ambulatory Visit (HOSPITAL_COMMUNITY)
Admission: RE | Admit: 2012-07-02 | Discharge: 2012-07-02 | Disposition: A | Discharge status: Home / Self Care | Payer: Medicare Other | Source: Ambulatory Visit | Attending: Family Medicine | Admitting: Family Medicine

## 2012-07-02 ENCOUNTER — Inpatient Hospital Stay (HOSPITAL_COMMUNITY): Admission: RE | Admit: 2012-07-02 | Payer: Medicare Other | Source: Ambulatory Visit

## 2012-07-02 DIAGNOSIS — N63 Unspecified lump in unspecified breast: Secondary | ICD-10-CM | POA: Insufficient documentation

## 2012-07-02 DIAGNOSIS — D249 Benign neoplasm of unspecified breast: Secondary | ICD-10-CM | POA: Insufficient documentation

## 2012-07-02 MED ORDER — LIDOCAINE HCL (PF) 2 % IJ SOLN
INTRAMUSCULAR | Status: AC
  Filled 2012-07-02: qty 10

## 2012-07-02 NOTE — OR Nursing (Addendum)
Patient does not have any medication allergies. She does not take blood thinners.Stated that she used her inhaler around 0630. Stated she took her blood pressure medication this morning.(Lisinopril)Procedure started at 1205. Patinet in supine position. Site cleaned. Lidocaine 2% 9cc used to numb biopsy area. Procedure ended 1219.

## 2013-04-20 ENCOUNTER — Other Ambulatory Visit (HOSPITAL_COMMUNITY): Payer: Self-pay | Admitting: Family Medicine

## 2013-04-20 DIAGNOSIS — Z09 Encounter for follow-up examination after completed treatment for conditions other than malignant neoplasm: Secondary | ICD-10-CM

## 2013-04-20 DIAGNOSIS — N63 Unspecified lump in unspecified breast: Secondary | ICD-10-CM

## 2013-04-29 ENCOUNTER — Ambulatory Visit (HOSPITAL_COMMUNITY)
Admission: RE | Admit: 2013-04-29 | Discharge: 2013-04-29 | Disposition: A | Discharge status: Home / Self Care | Payer: Medicare Other | Source: Ambulatory Visit | Attending: Family Medicine | Admitting: Family Medicine

## 2013-04-29 ENCOUNTER — Other Ambulatory Visit (HOSPITAL_COMMUNITY): Payer: Self-pay | Admitting: Family Medicine

## 2013-04-29 DIAGNOSIS — N63 Unspecified lump in unspecified breast: Secondary | ICD-10-CM

## 2013-04-29 DIAGNOSIS — Z139 Encounter for screening, unspecified: Secondary | ICD-10-CM

## 2013-05-22 ENCOUNTER — Ambulatory Visit (HOSPITAL_COMMUNITY)
Admission: RE | Admit: 2013-05-22 | Discharge: 2013-05-22 | Disposition: A | Discharge status: Home / Self Care | Payer: Medicare Other | Source: Ambulatory Visit | Attending: Family Medicine | Admitting: Family Medicine

## 2013-05-22 DIAGNOSIS — Z1231 Encounter for screening mammogram for malignant neoplasm of breast: Secondary | ICD-10-CM | POA: Insufficient documentation

## 2013-05-22 DIAGNOSIS — Z139 Encounter for screening, unspecified: Secondary | ICD-10-CM

## 2014-02-05 ENCOUNTER — Other Ambulatory Visit (HOSPITAL_COMMUNITY): Payer: Self-pay | Admitting: Nurse Practitioner

## 2014-02-05 ENCOUNTER — Ambulatory Visit (HOSPITAL_COMMUNITY)
Admission: RE | Admit: 2014-02-05 | Discharge: 2014-02-05 | Disposition: A | Discharge status: Home / Self Care | Payer: Medicare Other | Source: Ambulatory Visit | Attending: Nurse Practitioner | Admitting: Nurse Practitioner

## 2014-02-05 DIAGNOSIS — M549 Dorsalgia, unspecified: Secondary | ICD-10-CM

## 2014-02-05 DIAGNOSIS — M545 Low back pain, unspecified: Secondary | ICD-10-CM | POA: Insufficient documentation

## 2014-02-05 DIAGNOSIS — M47817 Spondylosis without myelopathy or radiculopathy, lumbosacral region: Secondary | ICD-10-CM | POA: Insufficient documentation

## 2014-03-15 ENCOUNTER — Encounter: Payer: Medicare Other | Attending: Nurse Practitioner | Admitting: Nutrition

## 2014-03-15 VITALS — Ht 66.0 in | Wt 216.0 lb

## 2014-03-15 DIAGNOSIS — E1165 Type 2 diabetes mellitus with hyperglycemia: Secondary | ICD-10-CM

## 2014-03-15 DIAGNOSIS — Z713 Dietary counseling and surveillance: Secondary | ICD-10-CM | POA: Diagnosis not present

## 2014-03-15 DIAGNOSIS — E118 Type 2 diabetes mellitus with unspecified complications: Secondary | ICD-10-CM | POA: Insufficient documentation

## 2014-03-15 DIAGNOSIS — IMO0002 Reserved for concepts with insufficient information to code with codable children: Secondary | ICD-10-CM

## 2014-03-15 NOTE — Patient Instructions (Signed)
Plan:  Aim for 2-3 Carb Choices per meal (30-45 grams) +/- 1 either way  Avoid snacks between meals Include protein in moderation with your meals  Consider reading food labels for Total Carbohydrate Consider  increasing your activity level by 30 for 60 minutes daily as tolerated Check blood sugars in the am and occasionally 2 hours after supper once or twice a week. Talk to your PCP about staring Metformin daily to help with blood sugar control

## 2014-03-15 NOTE — Progress Notes (Signed)
Diabetes Self-Management Education  Visit Type:   New Type 2 Diabetes  DSME  Appt. Start Time: 8:15 Appt. End Time: 9:30 am  03/15/2014  Jeanette Compton, identified by name and date of birth, is a 58 y.o. female with a diagnosis of Diabetes: Type 2.  Other people present during visit:   none   ASSESSMENT  Height 5\' 6"  (1.676 m), weight 216 lb (97.977 kg). Body mass index is 34.88 kg/(m^2).   Pt. Notes she lives with her daughter and three grandkids. She notes she has been working on losing weight. She works out with her son twice a week. Doesn't have a meter and hasn't been testing blood sugars. Most recent A1C was 7.1%. She thought she was borderline diabetic.   Initial Visit Information:  Are you currently following a meal plan?: No   Are you taking your medications as prescribed?: Yes Are you checking your feet?: Yes How many days per week are you checking your feet?: 7 How often do you need to have someone help you when you read instructions, pamphlets, or other written materials from your doctor or pharmacy?: 1 - Never What is the last grade level you completed in school?: 10  Psychosocial:    Patient Belief/Attitude about Diabetes: Motivated to manage diabetes Self-care barriers: None Self-management support: Doctor's office;Friends;Family Patient Concerns: Nutrition/Meal planning;Healthy Lifestyle;Weight Control;Monitoring;Medication Special Needs: None Preferred Learning Style: Auditory;Visual;Hands on Learning Readiness: Ready  Complications:   Last HgB A1C per patient/outside source: 7.1 mg/dL How often do you check your blood sugar?: 0 times/day (not testing) Have you had a dilated eye exam in the past 12 months?: No Have you had a dental exam in the past 12 months?: No  Diet Intake:  Breakfast: Boiled egg, 2 slices bacon,  Oatmeal plain 1 pkt and 1 apple,coffee,water Snack (morning): grapes, apple or corn chips sometimes. Lunch: Baked chicken, broccoli 1  cup,  macaroni and cheese 1/2 c,  Water 16 oz    Usually skps lunch and eats an earlier dinner Snack (afternoon): sometimes fruit Dinner: Didn't eat anything last night. Snack (evening): none Beverage(s): water, SF crystal light  Exercise:  Exercise: Moderate (swimming / aerobic walking) Moderate Exercise amount of time (min / week): 120  Individualized Plan for Diabetes Self-Management Training:   Learning Objective:  Patient will have a greater understanding of diabetes self-management.  Patient education plan per assessed needs and concerns is to attend individual sessions for     Education Topics Reviewed with Patient Today:  Definition of diabetes, type 1 and 2, and the diagnosis of diabetes;Factors that contribute to the development of diabetes;Explored patient's options for treatment of their diabetes Role of diet in the treatment of diabetes and the relationship between the three main macronutrients and blood glucose level;Carbohydrate counting;Meal options for control of blood glucose level and chronic complications.;Reviewed blood glucose goals for pre and post meals and how to evaluate the patients' food intake on their blood glucose level. Role of exercise on diabetes management, blood pressure control and cardiac health.   Identified appropriate SMBG and/or A1C goals.;Daily foot exams;Yearly dilated eye exam;Interpreting lab values - A1C, lipid, urine microalbumina.   Relationship between chronic complications and blood glucose control;Lipid levels, blood glucose control and heart disease;Dental care;Reviewed with patient heart disease, higher risk of, and prevention;Assessed and discussed foot care and prevention of foot problems Role of stress on diabetes;Worked with patient to identify barriers to care and solutions;Helped patient identify a support system for diabetes management;Identified and  addressed patients feelings and concerns about diabetes   Lifestyle issues that  need to be addressed for better diabetes care  PATIENTS GOALS/Plan (Developed by the patient):  Nutrition: Follow meal plan discussed;General guidelines for healthy choices and portions discussed;Adjust meds/carbs with exercise as discussed Physical Activity: Exercise 5-7 days per week Monitoring :  (Needs to get glucometer and test daily in am.) Reducing Risk: examine blood glucose patterns;do foot checks daily  Plan:   Plan:  Aim for 2-3 Carb Choices per meal (30-45 grams) +/- 1 either way  Avoid snacks between meals Include protein in moderation with your meals  Consider reading food labels for Total Carbohydrate Consider  increasing your activity level by 30 for 60 minutes daily as tolerated Check blood sugars in the am and occasionally 2 hours after supper once or twice a week. Talk to your PCP about staring Metformin daily to help with blood sugar control.  Expected Outcomes:  Demonstrated interest in learning. Expect positive outcomes   Education material provided: Meal plan card   Living Well with Diabetes  Carb Counting and Food Label handouts       My Plate Method  If problems or questions, patient to contact team via:  Phone  [phone]  Future DSME appointment: 2 wks  Recommendations: She will need a meter to start testing her blood sugars. May consider starting her on Metformin for improved blood sugars and cardiovascular benefit.

## 2014-04-01 ENCOUNTER — Ambulatory Visit: Payer: Medicare Other | Admitting: Nutrition

## 2014-04-09 ENCOUNTER — Encounter: Payer: Medicare Other | Attending: Nurse Practitioner | Admitting: Nutrition

## 2014-04-09 ENCOUNTER — Encounter: Payer: Self-pay | Admitting: Nutrition

## 2014-04-09 VITALS — Ht 65.0 in | Wt 217.0 lb

## 2014-04-09 DIAGNOSIS — E118 Type 2 diabetes mellitus with unspecified complications: Secondary | ICD-10-CM | POA: Diagnosis not present

## 2014-04-09 DIAGNOSIS — IMO0002 Reserved for concepts with insufficient information to code with codable children: Secondary | ICD-10-CM

## 2014-04-09 DIAGNOSIS — E1165 Type 2 diabetes mellitus with hyperglycemia: Secondary | ICD-10-CM

## 2014-04-09 DIAGNOSIS — Z713 Dietary counseling and surveillance: Secondary | ICD-10-CM | POA: Diagnosis not present

## 2014-04-09 NOTE — Patient Instructions (Signed)
1. Work on increasing physical activity to lose weight.  30 minutes or more daily. 2 Lose 1-2 lbs per week. 3. Check blood sugar 2 hours after supper or at bedtime a few times per week. 4. Get A1C below 6.5% by next MD visit.

## 2014-04-09 NOTE — Progress Notes (Signed)
Diabetes Self-Management Education  Visit Type:   Follow up  2 Diabetes  DSME  Appt. Start Time: 11:30 to 12 noon  04/09/2014  [name], identified by name and date of birth, is a 58 y.o. female with a diagnosis of Diabetes:  .  Other people present during visit:   none   ASSESSMENT  Height 5\' 5"  (1.651 m), weight 217 lb (98.431 kg). Body mass index is 36.11 kg/(m^2).    Initial Visit Information:      SHe continuing the exercise most every day. Walks 30 minutes about every day. Changed by eating smaller portions, cut out breads, avoided fried foods. Is now eating a happy meal instead of a value meal. Drinking only water now or 2% milk. Using more whole grain cereal. FBS have been upper 90-100's.             Psychosocial:    She is doing well and feels a lot better. Is taking better care of herself to prevent medication and complications of DM. Complications:  none  Diet Intake:  She is eating much better balanced meals. All foods are baked broiled and grilled. No fried foods. No snacks. Eats a lot of broccoli and vegetables and fresh fruit. Eats a Happy Meal if she goes out to eat. Drinking only water.  Exercise:  She is working out with her son and doing more walking and running up and down the stairs in her house.  LImited some by her back pain.  Individualized Plan for Diabetes Self-Management Training:   Learning Objective:  Patient will have a greater understanding of diabetes self-management.  Patient education plan per assessed needs and concerns is to attend individual sessions for  Goal: She will lose 1 lb per week til next visit. Get her A1C down to below 6.5% by next MD visit. She will increase exercise to 30 minutes daily.   Education Topics Reviewed with Patient Today: Reviewed sick day rules, traveling, benefits of weight loss and reducing fat and sodium in diet.   PATIENTS GOALS/Plan (Developed by the patient):  Goal: To get down to 200 lbs by  February. 2 Get A1C below 6.5% by next visit. Test blood sugar 2 hours after supper or before going to bed.  Plan:   Plan:  Aim for 2-3 Carb Choices per meal (30-45 grams) +/- 1 either way  Avoid snacks between meals Include protein in moderation with your meals  Consider reading food labels for Total Carbohydrate Consider  increasing your activity level by 30 for 60 minutes daily as tolerated Check blood sugars in the am and occasionally 2 hours after supper once or twice a week. INcrease exercise as tolerated and work on weight loss. Expected Outcomes:      Education material provided: Meal plan card   Living Well with Diabetes  Carb Counting and Food Label handouts       My Plate Method  If problems or questions, patient to contact team via:  Phone  [phone]  Future DSME appointment:  3 months.

## 2014-04-24 ENCOUNTER — Other Ambulatory Visit (HOSPITAL_COMMUNITY): Payer: Self-pay | Admitting: Nurse Practitioner

## 2014-04-24 DIAGNOSIS — Z1231 Encounter for screening mammogram for malignant neoplasm of breast: Secondary | ICD-10-CM

## 2014-04-24 DIAGNOSIS — Z139 Encounter for screening, unspecified: Secondary | ICD-10-CM

## 2014-05-24 ENCOUNTER — Ambulatory Visit (HOSPITAL_COMMUNITY)
Admission: RE | Admit: 2014-05-24 | Discharge: 2014-05-24 | Disposition: A | Discharge status: Home / Self Care | Payer: Medicare Other | Source: Ambulatory Visit | Attending: Nurse Practitioner | Admitting: Nurse Practitioner

## 2014-05-24 DIAGNOSIS — Z1231 Encounter for screening mammogram for malignant neoplasm of breast: Secondary | ICD-10-CM | POA: Insufficient documentation

## 2014-07-09 ENCOUNTER — Encounter: Payer: Medicare Other | Attending: Nurse Practitioner | Admitting: Nutrition

## 2014-08-17 ENCOUNTER — Emergency Department (HOSPITAL_COMMUNITY): Payer: Medicare Other

## 2014-08-17 ENCOUNTER — Emergency Department (HOSPITAL_COMMUNITY)
Admission: EM | Admit: 2014-08-17 | Discharge: 2014-08-17 | Disposition: A | Discharge status: Home / Self Care | Payer: Medicare Other | Attending: Emergency Medicine | Admitting: Emergency Medicine

## 2014-08-17 ENCOUNTER — Encounter (HOSPITAL_COMMUNITY): Payer: Self-pay | Admitting: Emergency Medicine

## 2014-08-17 DIAGNOSIS — M25562 Pain in left knee: Secondary | ICD-10-CM | POA: Diagnosis present

## 2014-08-17 DIAGNOSIS — Z8709 Personal history of other diseases of the respiratory system: Secondary | ICD-10-CM | POA: Diagnosis not present

## 2014-08-17 DIAGNOSIS — Z791 Long term (current) use of non-steroidal anti-inflammatories (NSAID): Secondary | ICD-10-CM | POA: Insufficient documentation

## 2014-08-17 DIAGNOSIS — Z87891 Personal history of nicotine dependence: Secondary | ICD-10-CM | POA: Insufficient documentation

## 2014-08-17 DIAGNOSIS — I10 Essential (primary) hypertension: Secondary | ICD-10-CM | POA: Insufficient documentation

## 2014-08-17 DIAGNOSIS — Z7982 Long term (current) use of aspirin: Secondary | ICD-10-CM | POA: Diagnosis not present

## 2014-08-17 DIAGNOSIS — Z8639 Personal history of other endocrine, nutritional and metabolic disease: Secondary | ICD-10-CM | POA: Insufficient documentation

## 2014-08-17 DIAGNOSIS — M1712 Unilateral primary osteoarthritis, left knee: Secondary | ICD-10-CM | POA: Diagnosis not present

## 2014-08-17 MED ORDER — CELECOXIB 100 MG PO CAPS: 100.0000 mg | ORAL_CAPSULE | Freq: Two times a day (BID) | ORAL | Status: DC

## 2014-08-17 NOTE — ED Notes (Signed)
Pt c/o left knee pain starting two days ago.  She describes it as grinding pain and that it "feels like the bones are rubbing together and it might give out." Currently rates pain at 10/10 with activity but negligible when at rest. Pt denies taking any medication to manage the pain today. Prior knee injury eight years ago initially hurt the same joint but pt denies ongoing symptoms until two days ago.

## 2014-08-17 NOTE — ED Provider Notes (Signed)
CSN: [id number]     Arrival date & time 08/17/14  1218 History   None    Chief Complaint  Patient presents with  . Knee Pain     (Consider location/radiation/quality/duration/timing/severity/associated sxs/prior Treatment) Patient is a 59 y.o. female presenting with knee pain. The history is provided by the patient.  Knee Pain Location:  Knee Time since incident:  2 days Injury: no   Knee location:  L knee Pain details:    Quality:  Aching (grinding)   Severity:  Moderate   Onset quality:  Gradual   Duration:  2 days   Timing:  Intermittent   Progression:  Worsening Chronicity:  New Prior injury to area:  No Relieved by:  Nothing Worsened by:  Bearing weight Ineffective treatments:  None tried Associated symptoms: decreased ROM   Associated symptoms: no numbness and no tingling   Risk factors: no frequent fractures and no recent illness     Past Medical History  Diagnosis Date  . Hypertension   . Bronchitis   . Hyperthyroidism    Past Surgical History  Procedure Laterality Date  . Abdominal hysterectomy    . Breast surgery    . Colonoscopy  09/13/2011    Procedure: COLONOSCOPY;  Surgeon: Rogene Houston, MD;  Location: [facility];  Service: Endoscopy;  Laterality: N/A;  1030   Family History  Problem Relation Age of Onset  . Colon cancer Neg Hx    History  Substance Use Topics  . Smoking status: Former Smoker -- 0.50 packs/day for 3 years  . Smokeless tobacco: Not on file  . Alcohol Use: No   OB History    No data available     Review of Systems  Musculoskeletal: Positive for arthralgias.  All other systems reviewed and are negative.     Allergies  Review of patient's allergies indicates no known allergies.  Home Medications   Prior to Admission medications   Medication Sig Start Date End Date Taking? Authorizing Provider  aspirin 81 MG tablet Take 81 mg by mouth daily.   Yes Historical Provider, MD  lisinopril-hydrochlorothiazide  (PRINZIDE,ZESTORETIC) 20-12.5 MG per tablet Take 1 tablet by mouth daily.   Yes Historical Provider, MD  meloxicam (MOBIC) 15 MG tablet Take 15 mg by mouth daily as needed for pain.    Yes Historical Provider, MD  Multiple Vitamins-Calcium (ONE-A-DAY WOMENS PO) Take 1 tablet by mouth daily.     Yes Historical Provider, MD  omega-3 acid ethyl esters (LOVAZA) 1 G capsule Take by mouth 2 (two) times daily.   Yes Historical Provider, MD  pravastatin (PRAVACHOL) 40 MG tablet Take 40 mg by mouth daily.   Yes Historical Provider, MD  Throat Lozenges (HALLS COUGH DROPS) LOZG Use as directed 1 lozenge in the mouth or throat as needed.     Yes Historical Provider, MD  triamcinolone cream (KENALOG) 0.1 % Apply 1 application topically 2 (two) times daily.   Yes Historical Provider, MD  chlorpheniramine-HYDROcodone (TUSSIONEX PENNKINETIC ER) 10-8 MG/5ML LQCR Take 5 mLs by mouth every 12 (twelve) hours. Patient not taking: Reported on 08/17/2014 01/19/11   Duaine Dredge, PA-C   BP 138/82 mmHg  Pulse 99  Temp(Src) 98.6 F (37 C) (Oral)  Resp 18  Ht 5\' 5"  (1.651 m)  Wt 215 lb (97.523 kg)  BMI 35.78 kg/m2  SpO2 69% Physical Exam  Constitutional: She is oriented to person, place, and time. She appears well-developed and well-nourished.  Non-toxic appearance.  HENT:  Head: Normocephalic.  Right Ear: Tympanic membrane and external ear normal.  Left Ear: Tympanic membrane and external ear normal.  Eyes: EOM and lids are normal. Pupils are equal, round, and reactive to light.  Neck: Normal range of motion. Neck supple. Carotid bruit is not present.  Cardiovascular: Normal rate, regular rhythm, normal heart sounds, intact distal pulses and normal pulses.   Pulmonary/Chest: Breath sounds normal. No respiratory distress.  Abdominal: Soft. Bowel sounds are normal. There is no tenderness. There is no guarding.  Musculoskeletal:       Left knee: She exhibits decreased range of motion. She exhibits no effusion  and no deformity. Tenderness found. No patellar tendon tenderness noted.  CREPITUS OF THE LEFT KNEE WITH ATTEMPTED rom.  Lymphadenopathy:       Head (right side): No submandibular adenopathy present.       Head (left side): No submandibular adenopathy present.    She has no cervical adenopathy.  Neurological: She is alert and oriented to person, place, and time. She has normal strength. No cranial nerve deficit or sensory deficit.  Skin: Skin is warm and dry.  Psychiatric: She has a normal mood and affect. Her speech is normal.  Nursing note and vitals reviewed.   ED Course  Procedures (including critical care time) Labs Review Labs Reviewed - No data to display  Imaging Review Dg Knee Complete 4 Views Left  08/17/2014   CLINICAL DATA:  LEFT knee pain. Patellar pain with walking. Onset of symptoms 2 days ago.  EXAM: LEFT KNEE - COMPLETE 4+ VIEW  COMPARISON:  01/17/2004.  FINDINGS: Anatomic alignment of the LEFT knee. Medial compartment osteoarthritis is mild. Incipient osteophytes in the medial compartment. Mild patellofemoral osteoarthritis. No effusion. Negative for fracture.  IMPRESSION: Mild medial and patellofemoral compartment osteoarthritis.   Electronically Signed   By: Dereck Ligas M.D.   On: 08/17/2014 13:11     EKG Interpretation None      MDM  X-ray of the left knee reveals medial and patellofemoral compartment osteoarthritis. There is no fracture or dislocation reported.  Patient is fitted with a knee immobilizer. She will be treated with Celebrex 2 times daily with food. She's referred to orthopedics. I have discussed the findings on examination, as well as the findings of the x-ray with the patient in terms which he understands, and she is in agreement with this discharge plan.    Final diagnoses:  None    *I have reviewed nursing notes, vital signs, and all appropriate lab and imaging results for this patient.[id number], PA-C 08/18/14  1519  Ezequiel Essex, MD 08/18/14 [phone]

## 2014-08-17 NOTE — Discharge Instructions (Signed)
Please see Dr Luna Glasgow or the orthopedic MD of your choice as soon as possible. Your xray reveals degenerative changes of multiple site on the left knee.  Arthritis, Nonspecific Arthritis is inflammation of a joint. This usually means pain, redness, warmth or swelling are present. One or more joints may be involved. There are a number of types of arthritis. Your caregiver may not be able to tell what type of arthritis you have right away. CAUSES  The most common cause of arthritis is the wear and tear on the joint (osteoarthritis). This causes damage to the cartilage, which can break down over time. The knees, hips, back and neck are most often affected by this type of arthritis. Other types of arthritis and common causes of joint pain include:  Sprains and other injuries near the joint. Sometimes minor sprains and injuries cause pain and swelling that develop hours later.  Rheumatoid arthritis. This affects hands, feet and knees. It usually affects both sides of your body at the same time. It is often associated with chronic ailments, fever, weight loss and general weakness.  Crystal arthritis. Gout and pseudo gout can cause occasional acute severe pain, redness and swelling in the foot, ankle, or knee.  Infectious arthritis. Bacteria can get into a joint through a break in overlying skin. This can cause infection of the joint. Bacteria and viruses can also spread through the blood and affect your joints.  Drug, infectious and allergy reactions. Sometimes joints can become mildly painful and slightly swollen with these types of illnesses. SYMPTOMS   Pain is the main symptom.  Your joint or joints can also be red, swollen and warm or hot to the touch.  You may have a fever with certain types of arthritis, or even feel overall ill.  The joint with arthritis will hurt with movement. Stiffness is present with some types of arthritis. DIAGNOSIS  Your caregiver will suspect arthritis based on  your description of your symptoms and on your exam. Testing may be needed to find the type of arthritis:  Blood and sometimes urine tests.  X-ray tests and sometimes CT or MRI scans.  Removal of fluid from the joint (arthrocentesis) is done to check for bacteria, crystals or other causes. Your caregiver (or a specialist) will numb the area over the joint with a local anesthetic, and use a needle to remove joint fluid for examination. This procedure is only minimally uncomfortable.  Even with these tests, your caregiver may not be able to tell what kind of arthritis you have. Consultation with a specialist (rheumatologist) may be helpful. TREATMENT  Your caregiver will discuss with you treatment specific to your type of arthritis. If the specific type cannot be determined, then the following general recommendations may apply. Treatment of severe joint pain includes:  Rest.  Elevation.  Anti-inflammatory medication (for example, ibuprofen) may be prescribed. Avoiding activities that cause increased pain.  Only take over-the-counter or prescription medicines for pain and discomfort as recommended by your caregiver.  Cold packs over an inflamed joint may be used for 10 to 15 minutes every hour. Hot packs sometimes feel better, but do not use overnight. Do not use hot packs if you are diabetic without your caregiver's permission.  A cortisone shot into arthritic joints may help reduce pain and swelling.  Any acute arthritis that gets worse over the next 1 to 2 days needs to be looked at to be sure there is no joint infection. Long-term arthritis treatment involves modifying activities and  lifestyle to reduce joint stress jarring. This can include weight loss. Also, exercise is needed to nourish the joint cartilage and remove waste. This helps keep the muscles around the joint strong. HOME CARE INSTRUCTIONS   Do not take aspirin to relieve pain if gout is suspected. This elevates uric acid  levels.  Only take over-the-counter or prescription medicines for pain, discomfort or fever as directed by your caregiver.  Rest the joint as much as possible.  If your joint is swollen, keep it elevated.  Use crutches if the painful joint is in your leg.  Drinking plenty of fluids may help for certain types of arthritis.  Follow your caregiver's dietary instructions.  Try low-impact exercise such as:  Swimming.  Water aerobics.  Biking.  Walking.  Morning stiffness is often relieved by a warm shower.  Put your joints through regular range-of-motion. SEEK MEDICAL CARE IF:   You do not feel better in 24 hours or are getting worse.  You have side effects to medications, or are not getting better with treatment. SEEK IMMEDIATE MEDICAL CARE IF:   You have a fever.  You develop severe joint pain, swelling or redness.  Many joints are involved and become painful and swollen.  There is severe back pain and/or leg weakness.  You have loss of bowel or bladder control. Document Released: 06/14/2004 Document Revised: 07/30/2011 Document Reviewed: 06/30/2008 [facility] Patient Information 2015 [place], [place]. This information is not intended to replace advice given to you by your health care provider. Make sure you discuss any questions you have with your health care provider.

## 2014-08-23 ENCOUNTER — Other Ambulatory Visit (HOSPITAL_COMMUNITY): Payer: Self-pay | Admitting: Orthopaedic Surgery

## 2014-08-23 DIAGNOSIS — M25562 Pain in left knee: Secondary | ICD-10-CM

## 2014-08-31 ENCOUNTER — Ambulatory Visit (HOSPITAL_COMMUNITY)
Admission: RE | Admit: 2014-08-31 | Discharge: 2014-08-31 | Disposition: A | Discharge status: Home / Self Care | Payer: Medicare Other | Source: Ambulatory Visit | Attending: Orthopaedic Surgery | Admitting: Orthopaedic Surgery

## 2014-08-31 DIAGNOSIS — R937 Abnormal findings on diagnostic imaging of other parts of musculoskeletal system: Secondary | ICD-10-CM | POA: Insufficient documentation

## 2014-08-31 DIAGNOSIS — M76892 Other specified enthesopathies of left lower limb, excluding foot: Secondary | ICD-10-CM | POA: Insufficient documentation

## 2014-08-31 DIAGNOSIS — M25462 Effusion, left knee: Secondary | ICD-10-CM | POA: Diagnosis not present

## 2014-08-31 DIAGNOSIS — M25562 Pain in left knee: Secondary | ICD-10-CM | POA: Insufficient documentation

## 2014-09-15 ENCOUNTER — Encounter (HOSPITAL_COMMUNITY): Payer: Self-pay | Admitting: Emergency Medicine

## 2014-09-15 ENCOUNTER — Emergency Department (HOSPITAL_COMMUNITY)
Admission: EM | Admit: 2014-09-15 | Discharge: 2014-09-15 | Disposition: A | Discharge status: Home / Self Care | Payer: No Typology Code available for payment source | Attending: Emergency Medicine | Admitting: Emergency Medicine

## 2014-09-15 DIAGNOSIS — Z87891 Personal history of nicotine dependence: Secondary | ICD-10-CM | POA: Insufficient documentation

## 2014-09-15 DIAGNOSIS — S3992XA Unspecified injury of lower back, initial encounter: Secondary | ICD-10-CM | POA: Insufficient documentation

## 2014-09-15 DIAGNOSIS — Z7982 Long term (current) use of aspirin: Secondary | ICD-10-CM | POA: Insufficient documentation

## 2014-09-15 DIAGNOSIS — Z8709 Personal history of other diseases of the respiratory system: Secondary | ICD-10-CM | POA: Diagnosis not present

## 2014-09-15 DIAGNOSIS — S139XXA Sprain of joints and ligaments of unspecified parts of neck, initial encounter: Secondary | ICD-10-CM

## 2014-09-15 DIAGNOSIS — I10 Essential (primary) hypertension: Secondary | ICD-10-CM | POA: Diagnosis not present

## 2014-09-15 DIAGNOSIS — Y9389 Activity, other specified: Secondary | ICD-10-CM | POA: Insufficient documentation

## 2014-09-15 DIAGNOSIS — Z79899 Other long term (current) drug therapy: Secondary | ICD-10-CM | POA: Insufficient documentation

## 2014-09-15 DIAGNOSIS — Y998 Other external cause status: Secondary | ICD-10-CM | POA: Insufficient documentation

## 2014-09-15 DIAGNOSIS — S134XXA Sprain of ligaments of cervical spine, initial encounter: Secondary | ICD-10-CM | POA: Diagnosis not present

## 2014-09-15 DIAGNOSIS — Y92410 Unspecified street and highway as the place of occurrence of the external cause: Secondary | ICD-10-CM | POA: Diagnosis not present

## 2014-09-15 DIAGNOSIS — S199XXA Unspecified injury of neck, initial encounter: Secondary | ICD-10-CM | POA: Diagnosis present

## 2014-09-15 MED ORDER — CYCLOBENZAPRINE HCL 10 MG PO TABS: 10.0000 mg | ORAL_TABLET | Freq: Two times a day (BID) | ORAL | Status: DC | PRN
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
## 2014-09-15 NOTE — Discharge Instructions (Signed)
If you were given medicines take as directed.  If you are on coumadin or contraceptives realize their levels and effectiveness is altered by many different medicines.  If you have any reaction (rash, tongues swelling, other) to the medicines stop taking and see a physician.   Please follow up as directed and return to the ER or see a physician for new or worsening symptoms  Such as extremity weakness, numbness, bowel or bladder changes.Thank you. Filed Vitals:   09/15/14 1408  BP: 161/88  Pulse: 76  Temp: 99.4 F (37.4 C)  TempSrc: Oral  Resp: 18  Height: 5\' 5"  (1.651 m)  Weight: 215 lb (97.523 kg)  SpO2: 99%
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
## 2014-09-20 ENCOUNTER — Other Ambulatory Visit (HOSPITAL_COMMUNITY): Payer: Self-pay | Admitting: Nurse Practitioner

## 2014-09-20 ENCOUNTER — Ambulatory Visit (HOSPITAL_COMMUNITY)
Admission: RE | Admit: 2014-09-20 | Discharge: 2014-09-20 | Disposition: A | Discharge status: Home / Self Care | Payer: Medicare Other | Source: Ambulatory Visit | Attending: Nurse Practitioner | Admitting: Nurse Practitioner

## 2014-09-20 DIAGNOSIS — M7731 Calcaneal spur, right foot: Secondary | ICD-10-CM | POA: Diagnosis not present

## 2014-09-20 DIAGNOSIS — M79671 Pain in right foot: Secondary | ICD-10-CM

## 2015-05-05 ENCOUNTER — Other Ambulatory Visit (HOSPITAL_COMMUNITY): Payer: Self-pay | Admitting: Nurse Practitioner

## 2015-05-05 DIAGNOSIS — Z1231 Encounter for screening mammogram for malignant neoplasm of breast: Secondary | ICD-10-CM

## 2015-05-23 ENCOUNTER — Emergency Department (HOSPITAL_COMMUNITY)
Admission: EM | Admit: 2015-05-23 | Discharge: 2015-05-23 | Disposition: A | Discharge status: Home / Self Care | Payer: Medicare Other | Attending: Emergency Medicine | Admitting: Emergency Medicine

## 2015-05-23 ENCOUNTER — Encounter (HOSPITAL_COMMUNITY): Payer: Self-pay | Admitting: Emergency Medicine

## 2015-05-23 DIAGNOSIS — I10 Essential (primary) hypertension: Secondary | ICD-10-CM | POA: Insufficient documentation

## 2015-05-23 DIAGNOSIS — Z7982 Long term (current) use of aspirin: Secondary | ICD-10-CM | POA: Insufficient documentation

## 2015-05-23 DIAGNOSIS — Z8639 Personal history of other endocrine, nutritional and metabolic disease: Secondary | ICD-10-CM | POA: Diagnosis not present

## 2015-05-23 DIAGNOSIS — J029 Acute pharyngitis, unspecified: Secondary | ICD-10-CM | POA: Diagnosis present

## 2015-05-23 DIAGNOSIS — Z87891 Personal history of nicotine dependence: Secondary | ICD-10-CM | POA: Diagnosis not present

## 2015-05-23 DIAGNOSIS — Z79899 Other long term (current) drug therapy: Secondary | ICD-10-CM | POA: Insufficient documentation

## 2015-05-23 DIAGNOSIS — H9202 Otalgia, left ear: Secondary | ICD-10-CM | POA: Diagnosis not present

## 2015-05-23 LAB — RAPID STREP SCREEN (MED CTR MEBANE ONLY): Streptococcus, Group A Screen (Direct): NEGATIVE

## 2015-05-23 MED ORDER — MAGIC MOUTHWASH W/LIDOCAINE: 10.0000 mL | Freq: Four times a day (QID) | ORAL | Status: DC | PRN

## 2015-05-23 MED ORDER — ACETAMINOPHEN 325 MG PO TABS
650.0000 mg | ORAL_TABLET | Freq: Once | ORAL | Status: AC
  Administered 2015-05-23: 650 mg via ORAL
  Filled 2015-05-23: qty 2

## 2015-05-23 MED ORDER — LIDOCAINE VISCOUS 2 % MT SOLN
15.0000 mL | Freq: Once | OROMUCOSAL | Status: AC
  Administered 2015-05-23: 15 mL via OROMUCOSAL
  Filled 2015-05-23: qty 15

## 2015-05-23 NOTE — ED Notes (Signed)
Pt reports sore throat and LT ear pain. Pt has no other complaints.

## 2015-05-23 NOTE — Discharge Instructions (Signed)

## 2015-05-25 LAB — CULTURE, GROUP A STREP

## 2015-05-25 NOTE — ED Provider Notes (Signed)
CSN: [id number]     Arrival date & time 05/23/15  [id number] History   First MD Initiated Contact with Patient 05/23/15 0935     Chief Complaint  Patient presents with  . Sore Throat     (Consider location/radiation/quality/duration/timing/severity/associated sxs/prior Treatment) The history is provided by the patient.   Jeanette Compton is a 60 y.o. female presenting with a several day history of sore throat and left ear pain.  She reports constant soreness and sharp throat pain with swallowing that radiates to her left ear.  She denies nasal or sinus congestion or drainage, ear drainage, neck stiffness, headache.  She has had subjective fever and a nonproductive mild cough.  No chest pain or sob.  She has taken no medicines for her symptoms.  Has had no known exposure to strep throat.     Past Medical History  Diagnosis Date  . Hypertension   . Bronchitis   . Hyperthyroidism    Past Surgical History  Procedure Laterality Date  . Abdominal hysterectomy    . Breast surgery    . Colonoscopy  09/13/2011    Procedure: COLONOSCOPY;  Surgeon: Rogene Houston, MD;  Location: [facility];  Service: Endoscopy;  Laterality: N/A;  1030   Family History  Problem Relation Age of Onset  . Colon cancer Neg Hx    Social History  Substance Use Topics  . Smoking status: Former Smoker -- 0.50 packs/day for 3 years  . Smokeless tobacco: None  . Alcohol Use: No   OB History    No data available     Review of Systems  Constitutional: Positive for fever.  HENT: Positive for ear pain and sore throat. Negative for congestion, facial swelling, rhinorrhea, sinus pressure, trouble swallowing and voice change.   Eyes: Negative for discharge.  Respiratory: Positive for cough. Negative for shortness of breath, wheezing and stridor.   Cardiovascular: Negative for chest pain.  Gastrointestinal: Negative for abdominal pain.  Genitourinary: Negative.   Musculoskeletal: Negative for neck pain.       Allergies  Review of patient's allergies indicates no known allergies.  Home Medications   Prior to Admission medications   Medication Sig Start Date End Date Taking? Authorizing Provider  amitriptyline (ELAVIL) 10 MG tablet Take 10 mg by mouth at bedtime as needed. for sleep 09/02/14  Yes Historical Provider, MD  aspirin 81 MG tablet Take 81 mg by mouth daily.   Yes Historical Provider, MD  losartan (COZAAR) 25 MG tablet Take 25 mg by mouth daily. 05/05/15  Yes Historical Provider, MD  meloxicam (MOBIC) 15 MG tablet Take 15 mg by mouth daily as needed for pain.    Yes Historical Provider, MD  Multiple Vitamins-Calcium (ONE-A-DAY WOMENS PO) Take 1 tablet by mouth daily.     Yes Historical Provider, MD  omega-3 acid ethyl esters (LOVAZA) 1 G capsule Take by mouth 2 (two) times daily.   Yes Historical Provider, MD  pravastatin (PRAVACHOL) 40 MG tablet Take 40 mg by mouth daily.   Yes Historical Provider, MD  magic mouthwash w/lidocaine SOLN Take 10 mLs by mouth 4 (four) times daily as needed (throat pain). Note to pharmacy - equal parts ingredients 05/23/15   Evalee Jefferson, PA-C   BP 151/78 mmHg  Pulse 79  Temp(Src) 98.5 F (36.9 C) (Oral)  Resp 16  Ht 5\' 5"  (1.651 m)  Wt 98.431 kg  BMI 36.11 kg/m2  SpO2 100% Physical Exam  Constitutional: She is oriented to person, place,  and time. She appears well-developed and well-nourished.  HENT:  Head: Normocephalic and atraumatic.  Right Ear: Tympanic membrane and ear canal normal.  Left Ear: Tympanic membrane and ear canal normal.  Nose: No mucosal edema or rhinorrhea.  Mouth/Throat: Uvula is midline and mucous membranes are normal. No trismus in the jaw. Posterior oropharyngeal erythema present. No oropharyngeal exudate, posterior oropharyngeal edema or tonsillar abscesses.  Mild posterior pharyngeal erythema. No head or neck adenopathy.  Eyes: Conjunctivae are normal.  Neck: Neck supple.  Cardiovascular: Normal rate and normal heart  sounds.   Pulmonary/Chest: Effort normal. No respiratory distress. She has no wheezes. She has no rales.  Musculoskeletal: Normal range of motion.  Neurological: She is alert and oriented to person, place, and time.  Skin: Skin is warm and dry. No rash noted.  Psychiatric: She has a normal mood and affect.    ED Course  Procedures (including critical care time) Labs Review Labs Reviewed  RAPID STREP SCREEN (NOT AT [facility])  CULTURE, GROUP A STREP    Imaging Review No results found. I have personally reviewed and evaluated these images and lab results as part of my medical decision-making.   EKG Interpretation None      MDM   Final diagnoses:  Viral pharyngitis    Patients labs reviewed. Culture pending, pt aware of culture. Results were also discussed with patient. Advised ibuprofen or tylenol, rest, magic mouthwash prescribed for additional pain relief.  Suspect viral pharyngitis.     Evalee Jefferson, PA-C 05/25/15 [id number]  Dorie Rank, MD 05/26/15 [phone]

## 2015-05-27 ENCOUNTER — Ambulatory Visit (HOSPITAL_COMMUNITY)
Admission: RE | Admit: 2015-05-27 | Discharge: 2015-05-27 | Disposition: A | Discharge status: Home / Self Care | Payer: Medicare Other | Source: Ambulatory Visit | Attending: Nurse Practitioner | Admitting: Nurse Practitioner

## 2015-05-27 DIAGNOSIS — Z1231 Encounter for screening mammogram for malignant neoplasm of breast: Secondary | ICD-10-CM | POA: Diagnosis not present

## 2015-09-29 ENCOUNTER — Ambulatory Visit (INDEPENDENT_AMBULATORY_CARE_PROVIDER_SITE_OTHER): Payer: Medicare Other

## 2015-09-29 ENCOUNTER — Encounter: Payer: Self-pay | Admitting: Orthopaedic Surgery

## 2015-09-29 ENCOUNTER — Ambulatory Visit: Payer: Medicare Other | Admitting: Orthopaedic Surgery

## 2015-09-29 ENCOUNTER — Ambulatory Visit (INDEPENDENT_AMBULATORY_CARE_PROVIDER_SITE_OTHER): Payer: Medicare Other | Admitting: Orthopaedic Surgery

## 2015-09-29 VITALS — Temp 97.3°F | Ht 65.0 in | Wt 221.0 lb

## 2015-09-29 DIAGNOSIS — M25572 Pain in left ankle and joints of left foot: Secondary | ICD-10-CM

## 2015-09-29 DIAGNOSIS — M25562 Pain in left knee: Secondary | ICD-10-CM

## 2015-09-29 MED ORDER — HYDROCODONE-ACETAMINOPHEN 5-325 MG PO TABS: 1.0000 | ORAL_TABLET | ORAL | Status: DC | PRN

## 2015-09-29 NOTE — Progress Notes (Signed)
Patient [id number] Jeanette Compton, female DOB:1955/11/12, 60 y.o. [id number]  Chief Complaint  Patient presents with  . Knee Pain    Left knee pain     HPI  JOHNATHAN CALLERY is a 59 y.o. female who has left knee pain and left ankle pain.  She has no trauma, no giving way, no redness.  She has some swelling of the left knee and popping.  She has no redness or swelling of the left ankle.  Knee Pain  There was no injury mechanism. The pain is present in the left knee. The quality of the pain is described as aching. The pain is at a severity of 4/10. The pain is moderate. The pain has been worsening since onset. Associated symptoms include a loss of motion. Pertinent negatives include no inability to bear weight, loss of sensation, muscle weakness, numbness or tingling. The symptoms are aggravated by weight bearing. She has tried ice and heat for the symptoms. The treatment provided mild relief.  Ankle Pain  There was no injury mechanism. The pain is present in the left ankle. The quality of the pain is described as aching. The pain is at a severity of 3/10. The pain is mild. The pain has been worsening since onset. Associated symptoms include a loss of motion. Pertinent negatives include no inability to bear weight, loss of sensation, muscle weakness, numbness or tingling. The symptoms are aggravated by weight bearing. She has tried ice for the symptoms. The treatment provided moderate relief.    Body mass index is 36.78 kg/(m^2).  She has hypertension well controlled.  Review of Systems  HENT: Negative for congestion.   Respiratory: Negative for cough and shortness of breath.   Cardiovascular: Negative for chest pain and leg swelling.  Endocrine: Positive for cold intolerance.  Musculoskeletal: Positive for joint swelling, arthralgias and gait problem.  Allergic/Immunologic: Positive for environmental allergies.  Neurological: Negative for tingling and numbness.  All other systems reviewed and are  negative.   Past Medical History  Diagnosis Date  . Hypertension   . Bronchitis   . Hyperthyroidism     Past Surgical History  Procedure Laterality Date  . Abdominal hysterectomy    . Breast surgery    . Colonoscopy  09/13/2011    Procedure: COLONOSCOPY;  Surgeon: Rogene Houston, MD;  Location: [facility];  Service: Endoscopy;  Laterality: N/A;  1030    Family History  Problem Relation Age of Onset  . Colon cancer Neg Hx     Social History Social History  Substance Use Topics  . Smoking status: Former Smoker -- 0.50 packs/day for 3 years  . Smokeless tobacco: None  . Alcohol Use: No    No Known Allergies  Current Outpatient Prescriptions  Medication Sig Dispense Refill  . aspirin 81 MG tablet Take 81 mg by mouth daily.    Marland Kitchen losartan (COZAAR) 25 MG tablet Take 25 mg by mouth daily.  1  . meloxicam (MOBIC) 15 MG tablet Take 15 mg by mouth daily as needed for pain.     . Multiple Vitamins-Calcium (ONE-A-DAY WOMENS PO) Take 1 tablet by mouth daily.      Marland Kitchen omega-3 acid ethyl esters (LOVAZA) 1 G capsule Take by mouth 2 (two) times daily.    . pravastatin (PRAVACHOL) 40 MG tablet Take 40 mg by mouth daily.    Marland Kitchen HYDROcodone-acetaminophen (NORCO/VICODIN) 5-325 MG tablet Take 1 tablet by mouth every 4 (four) hours as needed for moderate pain (Must last 30  days.  Do not take and drive a car or use machinery.). 120 tablet 0   No current facility-administered medications for this visit.     Physical Exam  Temperature 97.3 F (36.3 C), height 5\' 5"  (1.651 m), weight 221 lb (100.245 kg).  Constitutional: overall normal hygiene, normal nutrition, well developed, normal grooming, normal body habitus. Assistive device:none  Musculoskeletal: gait and station Limp left, muscle tone and strength are normal, no tremors or atrophy is present.  .  Neurological: coordination overall normal.  Deep tendon reflex/nerve stretch intact.  Sensation normal.  Cranial nerves II-XII intact.    Skin:   normal overall no scars, lesions, ulcers or rashes. No psoriasis.  Psychiatric: Alert and oriented x 3.  Recent memory intact, remote memory unclear.  Normal mood and affect. Well groomed.  Good eye contact.  Cardiovascular: overall no swelling, no varicosities, no edema bilaterally, normal temperatures of the legs and arms, no clubbing, cyanosis and good capillary refill.  Lymphatic: palpation is normal.  Right Ankle Exam  Right ankle exam is normal.   Left Ankle Exam  Swelling: none  Tenderness  The patient is experiencing tenderness in the lateral malleolus.   Range of Motion  The patient has normal left ankle ROM.   Muscle Strength  The patient has normal left ankle strength.  Tests  Anterior drawer: negative  Other  Erythema: absent Scars: absent Sensation: normal Pulse: present   Right Knee Exam  Right knee exam is normal.  Muscle Strength   The patient has normal right knee strength.   Left Knee Exam   Tenderness  The patient is experiencing tenderness in the medial joint line.  Range of Motion  Extension: normal  Flexion: 110   Muscle Strength   The patient has normal left knee strength.  Tests  McMurray:  Medial - negative  Lachman:  Anterior - negative      Other  Erythema: absent Scars: absent Sensation: normal Pulse: present Swelling: mild  Comments:  Crepitus      The patient has been educated about the nature of the problem(s) and counseled on treatment options.  The patient appeared to understand what I have discussed and is in agreement with it.  Encounter Diagnoses  Name Primary?  . Left knee pain Yes  . Left ankle pain     PLAN Call if any problems.  Precautions discussed.  Continue current medications.   Return to clinic 1 month

## 2015-10-27 ENCOUNTER — Encounter: Payer: Self-pay | Admitting: Orthopaedic Surgery

## 2015-10-27 ENCOUNTER — Ambulatory Visit (INDEPENDENT_AMBULATORY_CARE_PROVIDER_SITE_OTHER): Payer: Medicare Other | Admitting: Orthopaedic Surgery

## 2015-10-27 VITALS — BP 164/84 | HR 54 | Temp 97.3°F | Resp 16 | Ht 66.0 in | Wt 219.0 lb

## 2015-10-27 DIAGNOSIS — M25572 Pain in left ankle and joints of left foot: Secondary | ICD-10-CM

## 2015-10-27 DIAGNOSIS — M25562 Pain in left knee: Secondary | ICD-10-CM | POA: Diagnosis not present

## 2015-10-27 MED ORDER — HYDROCODONE-ACETAMINOPHEN 5-325 MG PO TABS: 1.0000 | ORAL_TABLET | ORAL | Status: DC | PRN

## 2015-10-27 NOTE — Progress Notes (Signed)
Patient Jeanette Compton Jeanette Compton, female DOB:Oct 06, 1955, 60 y.o. [id number]  Chief Complaint  Patient presents with  . Follow-up    left ankle/knee pain    HPI  Jeanette Compton is a 60 y.o. female who has chronic left knee and left ankle pain.  She is stable. She has swelling and popping of the left knee but no giving way, no locking.  She is active.  She has pain of the left ankle but overall better.   HPI  Body mass index is 35.36 kg/(m^2).  ROS  Review of Systems  HENT: Negative for congestion.   Respiratory: Negative for cough and shortness of breath.   Cardiovascular: Negative for chest pain and leg swelling.  Endocrine: Positive for cold intolerance.  Musculoskeletal: Positive for joint swelling, arthralgias and gait problem.  Allergic/Immunologic: Positive for environmental allergies.  Neurological: Negative for numbness.  All other systems reviewed and are negative.   Past Medical History  Diagnosis Date  . Hypertension   . Bronchitis   . Hyperthyroidism     Past Surgical History  Procedure Laterality Date  . Abdominal hysterectomy    . Breast surgery    . Colonoscopy  09/13/2011    Procedure: COLONOSCOPY;  Surgeon: Rogene Houston, MD;  Location: [facility];  Service: Endoscopy;  Laterality: N/A;  1030    Family History  Problem Relation Age of Onset  . Colon cancer Neg Hx     Social History Social History  Substance Use Topics  . Smoking status: Former Smoker -- 0.50 packs/day for 3 years  . Smokeless tobacco: None  . Alcohol Use: No    No Known Allergies  Current Outpatient Prescriptions  Medication Sig Dispense Refill  . aspirin 81 MG tablet Take 81 mg by mouth daily.    Marland Kitchen HYDROcodone-acetaminophen (NORCO/VICODIN) 5-325 MG tablet Take 1 tablet by mouth every 4 (four) hours as needed for moderate pain (Must last 30 days.  Do not take and drive a car or use machinery.). 120 tablet 0  . losartan (COZAAR) 25 MG tablet Take 25 mg by mouth daily.  1  .  meloxicam (MOBIC) 15 MG tablet Take 15 mg by mouth daily as needed for pain.     . Multiple Vitamins-Calcium (ONE-A-DAY WOMENS PO) Take 1 tablet by mouth daily.      Marland Kitchen omega-3 acid ethyl esters (LOVAZA) 1 G capsule Take by mouth 2 (two) times daily.    . pravastatin (PRAVACHOL) 40 MG tablet Take 40 mg by mouth daily.     No current facility-administered medications for this visit.     Physical Exam  Blood pressure 164/84, pulse 54, temperature 97.3 F (36.3 C), resp. rate 16, height 5\' 6"  (1.676 m), weight 219 lb (99.338 kg).  Constitutional: overall normal hygiene, normal nutrition, well developed, normal grooming, normal body habitus. Assistive device:none  Musculoskeletal: gait and station Limp left, muscle tone and strength are normal, no tremors or atrophy is present.  .  Neurological: coordination overall normal.  Deep tendon reflex/nerve stretch intact.  Sensation normal.  Cranial nerves II-XII intact.   Skin:   normal overall no scars, lesions, ulcers or rashes. No psoriasis.  Psychiatric: Alert and oriented x 3.  Recent memory intact, remote memory unclear.  Normal mood and affect. Well groomed.  Good eye contact.  Cardiovascular: overall no swelling, no varicosities, no edema bilaterally, normal temperatures of the legs and arms, no clubbing, cyanosis and good capillary refill.  Lymphatic: palpation is normal.  The  left lower extremity is examined:  Inspection:  Thigh:  Non-tender and no defects  Knee has swelling 1+ effusion.                        Joint tenderness is present                        Patient is tender over the medial joint line  Lower Leg:  Has normal appearance and no tenderness or defects  Ankle:  Non-tender and no defects  Foot:  Non-tender and no defects Range of Motion:  Knee:  Range of motion is: 0-105                        Crepitus is  present  Ankle:  Range of motion is normal. Strength and Tone:  The left lower extremity has normal  strength and tone. Stability:  Knee:  The knee is stable.  Ankle:  The ankle is stable.  She has some tenderness of the ankle but full motion.    The patient has been educated about the nature of the problem(s) and counseled on treatment options.  The patient appeared to understand what I have discussed and is in agreement with it.  Encounter Diagnoses  Name Primary?  . Left knee pain Yes  . Left ankle pain     PLAN Call if any problems.  Precautions discussed.  Continue current medications.   Return to clinic 3 months   Electronically Signed Sanjuana Kava, MD 6/8/20179:37 AM

## 2015-12-01 ENCOUNTER — Telehealth: Payer: Self-pay | Admitting: Orthopaedic Surgery

## 2015-12-01 MED ORDER — HYDROCODONE-ACETAMINOPHEN 5-325 MG PO TABS: 1.0000 | ORAL_TABLET | ORAL | Status: DC | PRN

## 2015-12-01 NOTE — Telephone Encounter (Signed)
Patient called to request refill on: HYDROcodone-acetaminophen (NORCO/VICODIN) 5-325 MG tablet [id number] - quantity 120.

## 2015-12-01 NOTE — Telephone Encounter (Signed)
Rx done. 

## 2015-12-29 ENCOUNTER — Telehealth: Payer: Self-pay | Admitting: Orthopaedic Surgery

## 2015-12-29 MED ORDER — HYDROCODONE-ACETAMINOPHEN 5-325 MG PO TABS: 1.0000 | ORAL_TABLET | ORAL | 0 refills | Status: DC | PRN

## 2015-12-29 NOTE — Telephone Encounter (Signed)
Hydrocodone-Acetaminophen  5/325mg  Qty 120 Tablets °

## 2016-01-31 ENCOUNTER — Encounter: Payer: Self-pay | Admitting: Orthopaedic Surgery

## 2016-01-31 ENCOUNTER — Ambulatory Visit (INDEPENDENT_AMBULATORY_CARE_PROVIDER_SITE_OTHER): Payer: Medicare Other | Admitting: Orthopaedic Surgery

## 2016-01-31 VITALS — BP 177/88 | HR 66 | Ht 66.0 in | Wt 216.0 lb

## 2016-01-31 DIAGNOSIS — M25562 Pain in left knee: Secondary | ICD-10-CM

## 2016-01-31 DIAGNOSIS — M25572 Pain in left ankle and joints of left foot: Secondary | ICD-10-CM

## 2016-01-31 MED ORDER — HYDROCODONE-ACETAMINOPHEN 5-325 MG PO TABS: 1.0000 | ORAL_TABLET | ORAL | 0 refills | Status: DC | PRN

## 2016-01-31 NOTE — Progress Notes (Signed)
Patient [id number] Jeanette Compton, female DOB:1956-01-11, 60 y.o. [id number]  Chief Complaint  Patient presents with  . Follow-up    left ankle and knee pain    HPI  Jeanette Compton is a 60 y.o. female who has chronic left knee and ankle pain. She is stable.  She has no new trauma.  The left knee has swelling and popping but no giving way or locking.  She has no redness or distal edema. HPI  Body mass index is 34.86 kg/m.  ROS  Review of Systems  HENT: Negative for congestion.   Respiratory: Negative for cough and shortness of breath.   Cardiovascular: Negative for chest pain and leg swelling.  Endocrine: Positive for cold intolerance.  Musculoskeletal: Positive for arthralgias, gait problem and joint swelling.  Allergic/Immunologic: Positive for environmental allergies.  Neurological: Negative for numbness.  All other systems reviewed and are negative.   Past Medical History:  Diagnosis Date  . Bronchitis   . Hypertension   . Hyperthyroidism     Past Surgical History:  Procedure Laterality Date  . ABDOMINAL HYSTERECTOMY    . BREAST SURGERY    . COLONOSCOPY  09/13/2011   Procedure: COLONOSCOPY;  Surgeon: Rogene Houston, MD;  Location: [facility];  Service: Endoscopy;  Laterality: N/A;  1030    Family History  Problem Relation Age of Onset  . Colon cancer Neg Hx     Social History Social History  Substance Use Topics  . Smoking status: Former Smoker    Packs/day: 0.50    Years: 3.00  . Smokeless tobacco: Never Used  . Alcohol use No    No Known Allergies  Current Outpatient Prescriptions  Medication Sig Dispense Refill  . aspirin 81 MG tablet Take 81 mg by mouth daily.    Marland Kitchen HYDROcodone-acetaminophen (NORCO/VICODIN) 5-325 MG tablet Take 1 tablet by mouth every 4 (four) hours as needed for moderate pain (Must last 14 days.Do not take and drive a car or use machinery.). 56 tablet 0  . losartan (COZAAR) 25 MG tablet Take 25 mg by mouth daily.  1  . meloxicam  (MOBIC) 15 MG tablet Take 15 mg by mouth daily as needed for pain.     . Multiple Vitamins-Calcium (ONE-A-DAY WOMENS PO) Take 1 tablet by mouth daily.      Marland Kitchen omega-3 acid ethyl esters (LOVAZA) 1 G capsule Take by mouth 2 (two) times daily.    . pravastatin (PRAVACHOL) 40 MG tablet Take 40 mg by mouth daily.     No current facility-administered medications for this visit.      Physical Exam  Blood pressure (!) 177/88, pulse 66, height 5\' 6"  (1.676 m), weight 216 lb (98 kg).  Constitutional: overall normal hygiene, normal nutrition, well developed, normal grooming, normal body habitus. Assistive device:none  Musculoskeletal: gait and station Limp left, muscle tone and strength are normal, no tremors or atrophy is present.  .  Neurological: coordination overall normal.  Deep tendon reflex/nerve stretch intact.  Sensation normal.  Cranial nerves II-XII intact.   Skin:   Normal overall no scars, lesions, ulcers or rashes. No psoriasis.  Psychiatric: Alert and oriented x 3.  Recent memory intact, remote memory unclear.  Normal mood and affect. Well groomed.  Good eye contact.  Cardiovascular: overall no swelling, no varicosities, no edema bilaterally, normal temperatures of the legs and arms, no clubbing, cyanosis and good capillary refill.  Lymphatic: palpation is normal.  The left lower extremity is examined:  Inspection:  Thigh:  Non-tender and no defects  Knee has swelling 1+ effusion.                        Joint tenderness is present                        Patient is tender over the medial joint line  Lower Leg:  Has normal appearance and no tenderness or defects  Ankle:  Non-tender and no defects  Foot:  Non-tender and no defects Range of Motion:  Knee:  Range of motion is: 0-105                        Crepitus is  present  Ankle:  Range of motion is normal. Strength and Tone:  The left lower extremity has normal strength and tone. Stability:  Knee:  The knee is  stable.  Ankle:  The ankle is stable.    The patient has been educated about the nature of the problem(s) and counseled on treatment options.  The patient appeared to understand what I have discussed and is in agreement with it.  Encounter Diagnoses  Name Primary?  . Left knee pain Yes  . Left ankle pain     PLAN Call if any problems.  Precautions discussed.  Continue current medications.   Return to clinic 3 months   Electronically Signed Sanjuana Kava, MD 9/12/20178:29 AM

## 2016-01-31 NOTE — Patient Instructions (Signed)
Generic Knee Exercises EXERCISES RANGE OF MOTION (ROM) AND STRETCHING EXERCISES These exercises may help you when beginning to rehabilitate your injury. Your symptoms may resolve with or without further involvement from your physician, physical therapist, or athletic trainer. While completing these exercises, remember:   Restoring tissue flexibility helps normal motion to return to the joints. This allows healthier, less painful movement and activity.  An effective stretch should be held for at least 30 seconds.  A stretch should never be painful. You should only feel a gentle lengthening or release in the stretched tissue. STRETCH - Knee Extension, Prone  Lie on your stomach on a firm surface, such as a bed or countertop. Place your right / left knee and leg just beyond the edge of the surface. You may wish to place a towel under the far end of your right / left thigh for comfort.  Relax your leg muscles and allow gravity to straighten your knee. Your clinician may advise you to add an ankle weight if more resistance is helpful for you.  You should feel a stretch in the back of your right / left knee. Hold this position for __________ seconds. Repeat __________ times. Complete this stretch __________ times per day. * Your physician, physical therapist, or athletic trainer may ask you to add ankle weight to enhance your stretch.  RANGE OF MOTION - Knee Flexion, Active  Lie on your back with both knees straight. (If this causes back discomfort, bend your opposite knee, placing your foot flat on the floor.)  Slowly slide your heel back toward your buttocks until you feel a gentle stretch in the front of your knee or thigh.  Hold for __________ seconds. Slowly slide your heel back to the starting position. Repeat __________ times. Complete this exercise __________ times per day.  STRETCH - Quadriceps, Prone   Lie on your stomach on a firm surface, such as a bed or padded floor.  Bend your  right / left knee and grasp your ankle. If you are unable to reach your ankle or pant leg, use a belt around your foot to lengthen your reach.  Gently pull your heel toward your buttocks. Your knee should not slide out to the side. You should feel a stretch in the front of your thigh and/or knee.  Hold this position for __________ seconds. Repeat __________ times. Complete this stretch __________ times per day.  STRETCH - Hamstrings, Supine   Lie on your back. Loop a belt or towel over the ball of your right / left foot.  Straighten your right / left knee and slowly pull on the belt to raise your leg. Do not allow the right / left knee to bend. Keep your opposite leg flat on the floor.  Raise the leg until you feel a gentle stretch behind your right / left knee or thigh. Hold this position for __________ seconds. Repeat __________ times. Complete this stretch __________ times per day.  STRENGTHENING EXERCISES These exercises may help you when beginning to rehabilitate your injury. They may resolve your symptoms with or without further involvement from your physician, physical therapist, or athletic trainer. While completing these exercises, remember:   Muscles can gain both the endurance and the strength needed for everyday activities through controlled exercises.  Complete these exercises as instructed by your physician, physical therapist, or athletic trainer. Progress the resistance and repetitions only as guided.  You may experience muscle soreness or fatigue, but the pain or discomfort you are trying to   eliminate should never worsen during these exercises. If this pain does worsen, stop and make certain you are following the directions exactly. If the pain is still present after adjustments, discontinue the exercise until you can discuss the trouble with your clinician. STRENGTH - Quadriceps, Isometrics  Lie on your back with your right / left leg extended and your opposite knee  bent.  Gradually tense the muscles in the front of your right / left thigh. You should see either your knee cap slide up toward your hip or increased dimpling just above the knee. This motion will push the back of the knee down toward the floor/mat/bed on which you are lying.  Hold the muscle as tight as you can without increasing your pain for __________ seconds.  Relax the muscles slowly and completely in between each repetition. Repeat __________ times. Complete this exercise __________ times per day.  STRENGTH - Quadriceps, Short Arcs   Lie on your back. Place a __________ inch towel roll under your knee so that the knee slightly bends.  Raise only your lower leg by tightening the muscles in the front of your thigh. Do not allow your thigh to rise.  Hold this position for __________ seconds. Repeat __________ times. Complete this exercise __________ times per day.  OPTIONAL ANKLE WEIGHTS: Begin with ____________________, but DO NOT exceed ____________________. Increase in 1 pound/0.5 kilogram increments.  STRENGTH - Quadriceps, Straight Leg Raises  Quality counts! Watch for signs that the quadriceps muscle is working to insure you are strengthening the correct muscles and not "cheating" by substituting with healthier muscles.  Lay on your back with your right / left leg extended and your opposite knee bent.  Tense the muscles in the front of your right / left thigh. You should see either your knee cap slide up or increased dimpling just above the knee. Your thigh may even quiver.  Tighten these muscles even more and raise your leg 4 to 6 inches off the floor. Hold for __________ seconds.  Keeping these muscles tense, lower your leg.  Relax the muscles slowly and completely in between each repetition. Repeat __________ times. Complete this exercise __________ times per day.  STRENGTH - Hamstring, Curls  Lay on your stomach with your legs extended. (If you lay on a bed, your feet  may hang over the edge.)  Tighten the muscles in the back of your thigh to bend your right / left knee up to 90 degrees. Keep your hips flat on the bed/floor.  Hold this position for __________ seconds.  Slowly lower your leg back to the starting position. Repeat __________ times. Complete this exercise __________ times per day.  OPTIONAL ANKLE WEIGHTS: Begin with ____________________, but DO NOT exceed ____________________. Increase in 1 pound/0.5 kilogram increments.  STRENGTH - Quadriceps, Squats  Stand in a door frame so that your feet and knees are in line with the frame.  Use your hands for balance, not support, on the frame.  Slowly lower your weight, bending at the hips and knees. Keep your lower legs upright so that they are parallel with the door frame. Squat only within the range that does not increase your knee pain. Never let your hips drop below your knees.  Slowly return upright, pushing with your legs, not pulling with your hands. Repeat __________ times. Complete this exercise __________ times per day.  STRENGTH - Quadriceps, Wall Slides  Follow guidelines for form closely. Increased knee pain often results from poorly placed feet or knees.    Lean against a smooth wall or door and walk your feet out 18-24 inches. Place your feet hip-width apart.  Slowly slide down the wall or door until your knees bend __________ degrees.* Keep your knees over your heels, not your toes, and in line with your hips, not falling to either side.  Hold for __________ seconds. Stand up to rest for __________ seconds in between each repetition. Repeat __________ times. Complete this exercise __________ times per day. * Your physician, physical therapist, or athletic trainer will alter this angle based on your symptoms and progress.   This information is not intended to replace advice given to you by your health care provider. Make sure you discuss any questions you have with your health care  provider.   Document Released: 03/21/2005 Document Revised: 05/28/2014 Document Reviewed: 08/19/2008 Elsevier Interactive Patient Education 2016 [employer]  

## 2016-02-14 ENCOUNTER — Telehealth: Payer: Self-pay | Admitting: Orthopaedic Surgery

## 2016-02-14 MED ORDER — HYDROCODONE-ACETAMINOPHEN 5-325 MG PO TABS: 1.0000 | ORAL_TABLET | Freq: Four times a day (QID) | ORAL | 0 refills | Status: DC | PRN

## 2016-02-14 NOTE — Telephone Encounter (Signed)
Patient requests a refill on Hydrocodone/Acetaminophen (Norco) 5-325 mgs.  Qty  27  Sig: Take 1 tablet by mouth every 4 (four) hours as needed for moderate pain (Must last 14 days.Do not take and drive a car or use machinery.).  PATIENT HAS MEDICAID

## 2016-02-27 ENCOUNTER — Telehealth: Payer: Self-pay | Admitting: Orthopaedic Surgery

## 2016-02-27 MED ORDER — HYDROCODONE-ACETAMINOPHEN 5-325 MG PO TABS: 1.0000 | ORAL_TABLET | Freq: Four times a day (QID) | ORAL | 0 refills | Status: DC | PRN

## 2016-02-27 NOTE — Telephone Encounter (Signed)
Patient called for refill:  HYDROcodone-acetaminophen (NORCO/VICODIN) 5-325 MG tablet 50 tablet    - insurance: Medicare + Medicaid

## 2016-03-12 ENCOUNTER — Telehealth: Payer: Self-pay | Admitting: Orthopaedic Surgery

## 2016-03-12 MED ORDER — HYDROCODONE-ACETAMINOPHEN 5-325 MG PO TABS: 1.0000 | ORAL_TABLET | Freq: Four times a day (QID) | ORAL | 0 refills | Status: DC | PRN

## 2016-03-12 NOTE — Telephone Encounter (Signed)
Hydrocodone-Acetaminophen  5/325mg  Qty 40 Tablets °

## 2016-03-26 ENCOUNTER — Telehealth: Payer: Self-pay | Admitting: Orthopaedic Surgery

## 2016-03-26 MED ORDER — HYDROCODONE-ACETAMINOPHEN 5-325 MG PO TABS: 1.0000 | ORAL_TABLET | Freq: Four times a day (QID) | ORAL | 0 refills | Status: DC | PRN

## 2016-03-26 NOTE — Telephone Encounter (Signed)
Hydrocodone-Acetaminophen 5/325mg  Qty 35 Tablets °

## 2016-04-09 ENCOUNTER — Telehealth: Payer: Self-pay | Admitting: Orthopaedic Surgery

## 2016-04-09 MED ORDER — HYDROCODONE-ACETAMINOPHEN 5-325 MG PO TABS: 1.0000 | ORAL_TABLET | Freq: Four times a day (QID) | ORAL | 0 refills | Status: DC | PRN

## 2016-04-09 NOTE — Telephone Encounter (Signed)
Patient requests refill:  °HYDROcodone-acetaminophen (NORCO/VICODIN) 5-325 MG tablet 28 tablet  ° ° °

## 2016-04-09 NOTE — Telephone Encounter (Signed)
ROUTING TO DR KEELING 

## 2016-04-16 ENCOUNTER — Other Ambulatory Visit (HOSPITAL_COMMUNITY): Payer: Self-pay | Admitting: Internal Medicine

## 2016-04-16 DIAGNOSIS — Z1231 Encounter for screening mammogram for malignant neoplasm of breast: Secondary | ICD-10-CM

## 2016-04-23 ENCOUNTER — Telehealth: Payer: Self-pay | Admitting: Orthopaedic Surgery

## 2016-04-23 MED ORDER — HYDROCODONE-ACETAMINOPHEN 5-325 MG PO TABS: 1.0000 | ORAL_TABLET | Freq: Four times a day (QID) | ORAL | 0 refills | Status: DC | PRN

## 2016-04-23 NOTE — Telephone Encounter (Signed)
Hydrocodone-Acetaminophen  5/325mg  Qty 25 Tablets °

## 2016-05-01 ENCOUNTER — Ambulatory Visit (INDEPENDENT_AMBULATORY_CARE_PROVIDER_SITE_OTHER): Payer: Medicare Other | Admitting: Orthopaedic Surgery

## 2016-05-01 ENCOUNTER — Encounter: Payer: Self-pay | Admitting: Orthopaedic Surgery

## 2016-05-01 VITALS — BP 163/85 | HR 54 | Temp 97.7°F | Ht 65.0 in | Wt 191.0 lb

## 2016-05-01 DIAGNOSIS — M25562 Pain in left knee: Secondary | ICD-10-CM

## 2016-05-01 DIAGNOSIS — G8929 Other chronic pain: Secondary | ICD-10-CM | POA: Diagnosis not present

## 2016-05-01 MED ORDER — HYDROCODONE-ACETAMINOPHEN 5-325 MG PO TABS: 1.0000 | ORAL_TABLET | Freq: Four times a day (QID) | ORAL | 0 refills | Status: DC | PRN

## 2016-05-01 MED ORDER — NAPROXEN 500 MG PO TABS: 500.0000 mg | ORAL_TABLET | Freq: Two times a day (BID) | ORAL | 5 refills | Status: DC

## 2016-05-01 NOTE — Progress Notes (Signed)
Patient [id number] Jeanette Compton, female DOB:07/07/1955, 60 y.o. [id number]  Chief Complaint  Patient presents with  . Follow-up    left knee, ankle    HPI  Jeanette Compton is a 60 y.o. female who has chronic pain of the right knee.  She has no giving way or locking. She has swelling and popping.  She would like to have Supartz injections and I have written Rx for this. She wants to have it after the first of the new year. HPI  Body mass index is 31.78 kg/m.  ROS  Review of Systems  HENT: Negative for congestion.   Respiratory: Negative for cough and shortness of breath.   Cardiovascular: Negative for chest pain and leg swelling.  Endocrine: Positive for cold intolerance.  Musculoskeletal: Positive for arthralgias, gait problem and joint swelling.  Allergic/Immunologic: Positive for environmental allergies.  Neurological: Negative for numbness.  All other systems reviewed and are negative.   Past Medical History:  Diagnosis Date  . Bronchitis   . Hypertension   . Hyperthyroidism     Past Surgical History:  Procedure Laterality Date  . ABDOMINAL HYSTERECTOMY    . BREAST SURGERY    . COLONOSCOPY  09/13/2011   Procedure: COLONOSCOPY;  Surgeon: Rogene Houston, MD;  Location: [facility];  Service: Endoscopy;  Laterality: N/A;  1030    Family History  Problem Relation Age of Onset  . Colon cancer Neg Hx     Social History Social History  Substance Use Topics  . Smoking status: Former Smoker    Packs/day: 0.50    Years: 3.00  . Smokeless tobacco: Never Used  . Alcohol use No    No Known Allergies  Current Outpatient Prescriptions  Medication Sig Dispense Refill  . aspirin 81 MG tablet Take 81 mg by mouth daily.    Marland Kitchen HYDROcodone-acetaminophen (NORCO/VICODIN) 5-325 MG tablet Take 1 tablet by mouth every 6 (six) hours as needed for moderate pain (Must last 30 days.Do not take and drive a car or use machinery.). 100 tablet 0  . losartan (COZAAR) 25 MG tablet Take  25 mg by mouth daily.  1  . meloxicam (MOBIC) 15 MG tablet Take 15 mg by mouth daily as needed for pain.     . Multiple Vitamins-Calcium (ONE-A-DAY WOMENS PO) Take 1 tablet by mouth daily.      . naproxen (NAPROSYN) 500 MG tablet Take 1 tablet (500 mg total) by mouth 2 (two) times daily with a meal. 60 tablet 5  . omega-3 acid ethyl esters (LOVAZA) 1 G capsule Take by mouth 2 (two) times daily.    . pravastatin (PRAVACHOL) 40 MG tablet Take 40 mg by mouth daily.     No current facility-administered medications for this visit.      Physical Exam  Blood pressure (!) 163/85, pulse (!) 54, temperature 97.7 F (36.5 C), height 5\' 5"  (1.651 m), weight 191 lb (86.6 kg).  Constitutional: overall normal hygiene, normal nutrition, well developed, normal grooming, normal body habitus. Assistive device:none  Musculoskeletal: gait and station Limp right, muscle tone and strength are normal, no tremors or atrophy is present.  .  Neurological: coordination overall normal.  Deep tendon reflex/nerve stretch intact.  Sensation normal.  Cranial nerves II-XII intact.   Skin:   Normal overall no scars, lesions, ulcers or rashes. No psoriasis.  Psychiatric: Alert and oriented x 3.  Recent memory intact, remote memory unclear.  Normal mood and affect. Well groomed.  Good eye contact.  Cardiovascular: overall no swelling, no varicosities, no edema bilaterally, normal temperatures of the legs and arms, no clubbing, cyanosis and good capillary refill.  Lymphatic: palpation is normal.  The right lower extremity is examined:  Inspection:  Thigh:  Non-tender and no defects  Knee has swelling 1+ effusion.                        Joint tenderness is present                        Patient is tender over the medial joint line  Lower Leg:  Has normal appearance and no tenderness or defects  Ankle:  Non-tender and no defects  Foot:  Non-tender and no defects Range of Motion:  Knee:  Range of motion is: 0-105                         Crepitus is  present  Ankle:  Range of motion is normal. Strength and Tone:  The right lower extremity has normal strength and tone. Stability:  Knee:  The knee is stable.  Ankle:  The ankle is stable.    The patient has been educated about the nature of the problem(s) and counseled on treatment options.  The patient appeared to understand what I have discussed and is in agreement with it.  Encounter Diagnosis  Name Primary?  . Chronic pain of left knee Yes    PLAN Call if any problems.  Precautions discussed.  Continue current medications.   Return to clinic 1 month   Electronically Signed Sanjuana Kava, MD 12/12/20179:11 AM

## 2016-05-08 ENCOUNTER — Telehealth: Payer: Self-pay | Admitting: Orthopaedic Surgery

## 2016-05-08 NOTE — Telephone Encounter (Signed)
Deny this one.

## 2016-05-08 NOTE — Telephone Encounter (Signed)
Pt requested refill Hydrocodone/Acetaminophen (Nroco)  5-325 mgs.  Qty  20

## 2016-05-09 NOTE — Telephone Encounter (Signed)
Relayed to patient, per Dr Luna Glasgow.  Patient voiced understanding.

## 2016-05-28 ENCOUNTER — Ambulatory Visit (HOSPITAL_COMMUNITY): Payer: Medicare Other

## 2016-05-28 ENCOUNTER — Other Ambulatory Visit (HOSPITAL_COMMUNITY): Payer: Self-pay | Admitting: Internal Medicine

## 2016-05-28 ENCOUNTER — Ambulatory Visit (HOSPITAL_COMMUNITY)
Admission: RE | Admit: 2016-05-28 | Discharge: 2016-05-28 | Disposition: A | Discharge status: Home / Self Care | Payer: Medicare Other | Source: Ambulatory Visit | Attending: Internal Medicine | Admitting: Internal Medicine

## 2016-05-28 DIAGNOSIS — Z1231 Encounter for screening mammogram for malignant neoplasm of breast: Secondary | ICD-10-CM

## 2016-05-29 ENCOUNTER — Ambulatory Visit: Payer: Medicare Other | Admitting: Orthopaedic Surgery

## 2016-06-05 ENCOUNTER — Ambulatory Visit (INDEPENDENT_AMBULATORY_CARE_PROVIDER_SITE_OTHER): Payer: Medicare Other | Admitting: Orthopaedic Surgery

## 2016-06-05 VITALS — BP 130/78 | HR 65 | Temp 97.5°F | Ht 65.0 in | Wt 218.0 lb

## 2016-06-05 DIAGNOSIS — G8929 Other chronic pain: Secondary | ICD-10-CM | POA: Diagnosis not present

## 2016-06-05 DIAGNOSIS — M25562 Pain in left knee: Secondary | ICD-10-CM

## 2016-06-05 MED ORDER — HYDROCODONE-ACETAMINOPHEN 5-325 MG PO TABS: 1.0000 | ORAL_TABLET | Freq: Four times a day (QID) | ORAL | 0 refills | Status: DC | PRN

## 2016-06-05 NOTE — Progress Notes (Signed)
Patient [id number] Jeanette Compton, female DOB:May 28, 1955, 61 y.o. [id number]  Chief Complaint  Patient presents with  . Follow-up    Chronic left knee pain    HPI  Jeanette Compton is a 61 y.o. female who has chronic left knee pain.  She has swelling but no giving way, no locking.  She has no new trauma.  She is taking her medicine. HPI  Body mass index is 36.28 kg/m.  ROS  Review of Systems  HENT: Negative for congestion.   Respiratory: Negative for cough and shortness of breath.   Cardiovascular: Negative for chest pain and leg swelling.  Endocrine: Positive for cold intolerance.  Musculoskeletal: Positive for arthralgias, gait problem and joint swelling.  Allergic/Immunologic: Positive for environmental allergies.  Neurological: Negative for numbness.  All other systems reviewed and are negative.   Past Medical History:  Diagnosis Date  . Bronchitis   . Hypertension   . Hyperthyroidism     Past Surgical History:  Procedure Laterality Date  . ABDOMINAL HYSTERECTOMY    . BREAST SURGERY    . COLONOSCOPY  09/13/2011   Procedure: COLONOSCOPY;  Surgeon: Rogene Houston, MD;  Location: [facility];  Service: Endoscopy;  Laterality: N/A;  1030    Family History  Problem Relation Age of Onset  . Colon cancer Neg Hx     Social History Social History  Substance Use Topics  . Smoking status: Former Smoker    Packs/day: 0.50    Years: 3.00  . Smokeless tobacco: Never Used  . Alcohol use No    No Known Allergies  Current Outpatient Prescriptions  Medication Sig Dispense Refill  . aspirin 81 MG tablet Take 81 mg by mouth daily.    Marland Kitchen HYDROcodone-acetaminophen (NORCO/VICODIN) 5-325 MG tablet Take 1 tablet by mouth every 6 (six) hours as needed for moderate pain (Must last 30 days.Do not take and drive a car or use machinery.). 90 tablet 0  . losartan (COZAAR) 25 MG tablet Take 25 mg by mouth daily.  1  . meloxicam (MOBIC) 15 MG tablet Take 15 mg by mouth daily as needed  for pain.     . Multiple Vitamins-Calcium (ONE-A-DAY WOMENS PO) Take 1 tablet by mouth daily.      . naproxen (NAPROSYN) 500 MG tablet Take 1 tablet (500 mg total) by mouth 2 (two) times daily with a meal. 60 tablet 5  . omega-3 acid ethyl esters (LOVAZA) 1 G capsule Take by mouth 2 (two) times daily.    . pravastatin (PRAVACHOL) 40 MG tablet Take 40 mg by mouth daily.     No current facility-administered medications for this visit.      Physical Exam  Blood pressure 130/78, pulse 65, temperature 97.5 F (36.4 C), height 5\' 5"  (1.651 m), weight 218 lb (98.9 kg).  Constitutional: overall normal hygiene, normal nutrition, well developed, normal grooming, normal body habitus. Assistive device:none  Musculoskeletal: gait and station Limp left, muscle tone and strength are normal, no tremors or atrophy is present.  .  Neurological: coordination overall normal.  Deep tendon reflex/nerve stretch intact.  Sensation normal.  Cranial nerves II-XII intact.   Skin:   Normal overall no scars, lesions, ulcers or rashes. No psoriasis.  Psychiatric: Alert and oriented x 3.  Recent memory intact, remote memory unclear.  Normal mood and affect. Well groomed.  Good eye contact.  Cardiovascular: overall no swelling, no varicosities, no edema bilaterally, normal temperatures of the legs and arms, no clubbing, cyanosis and good  capillary refill.  Lymphatic: palpation is normal.  The left lower extremity is examined:  Inspection:  Thigh:  Non-tender and no defects  Knee has swelling 1+ effusion.                        Joint tenderness is present                        Patient is tender over the medial joint line  Lower Leg:  Has normal appearance and no tenderness or defects  Ankle:  Non-tender and no defects  Foot:  Non-tender and no defects Range of Motion:  Knee:  Range of motion is: 0-105                        Crepitus is  present  Ankle:  Range of motion is normal. Strength and Tone:  The  left lower extremity has normal strength and tone. Stability:  Knee:  The knee is stable.  Ankle:  The ankle is stable.    The patient has been educated about the nature of the problem(s) and counseled on treatment options.  The patient appeared to understand what I have discussed and is in agreement with it.  Encounter Diagnosis  Name Primary?  . Chronic pain of left knee Yes    PLAN Call if any problems.  Precautions discussed.  Continue current medications.   Return to clinic 1 month   Pain medicine given after first checking state narcotic site.  Electronically Signed Sanjuana Kava, MD 1/16/20189:43 AM

## 2016-07-03 ENCOUNTER — Ambulatory Visit (INDEPENDENT_AMBULATORY_CARE_PROVIDER_SITE_OTHER): Payer: Medicare Other | Admitting: Orthopaedic Surgery

## 2016-07-03 ENCOUNTER — Encounter: Payer: Self-pay | Admitting: Orthopaedic Surgery

## 2016-07-03 VITALS — BP 169/92 | HR 63 | Temp 97.5°F | Ht 65.0 in | Wt 216.0 lb

## 2016-07-03 DIAGNOSIS — M25562 Pain in left knee: Secondary | ICD-10-CM | POA: Diagnosis not present

## 2016-07-03 DIAGNOSIS — G8929 Other chronic pain: Secondary | ICD-10-CM | POA: Diagnosis not present

## 2016-07-03 MED ORDER — HYDROCODONE-ACETAMINOPHEN 5-325 MG PO TABS: 1.0000 | ORAL_TABLET | Freq: Four times a day (QID) | ORAL | 0 refills | Status: DC | PRN

## 2016-07-03 NOTE — Progress Notes (Signed)
Patient Jeanette Compton Jeanette Compton, female DOB:Jan 21, 1956, 61 y.o. [id number]  Chief Complaint  Patient presents with  . Follow-up    left knee pain    HPI  [name] is a 61 y.o. female who has chronic left knee pain.  She has more pain with the cooler weather. She has no new trauma, no giving way, no locking.  She has been doing her exercises and taking her medicine. HPI  Body mass index is 35.94 kg/m.  ROS  Review of Systems  HENT: Negative for congestion.   Respiratory: Negative for cough and shortness of breath.   Cardiovascular: Negative for chest pain and leg swelling.  Endocrine: Positive for cold intolerance.  Musculoskeletal: Positive for arthralgias, gait problem and joint swelling.  Allergic/Immunologic: Positive for environmental allergies.  Neurological: Negative for numbness.  All other systems reviewed and are negative.   Past Medical History:  Diagnosis Date  . Bronchitis   . Hypertension   . Hyperthyroidism     Past Surgical History:  Procedure Laterality Date  . ABDOMINAL HYSTERECTOMY    . BREAST SURGERY    . COLONOSCOPY  09/13/2011   Procedure: COLONOSCOPY;  Surgeon: Rogene Houston, MD;  Location: [facility];  Service: Endoscopy;  Laterality: N/A;  1030    Family History  Problem Relation Age of Onset  . Colon cancer Neg Hx     Social History Social History  Substance Use Topics  . Smoking status: Former Smoker    Packs/day: 0.50    Years: 3.00  . Smokeless tobacco: Never Used  . Alcohol use No    No Known Allergies  Current Outpatient Prescriptions  Medication Sig Dispense Refill  . aspirin 81 MG tablet Take 81 mg by mouth daily.    Marland Kitchen HYDROcodone-acetaminophen (NORCO/VICODIN) 5-325 MG tablet Take 1 tablet by mouth every 6 (six) hours as needed for moderate pain (Must last 30 days.Do not take and drive a car or use machinery.). 90 tablet 0  . losartan (COZAAR) 25 MG tablet Take 25 mg by mouth daily.  1  . meloxicam (MOBIC) 15 MG  tablet Take 15 mg by mouth daily as needed for pain.     . Multiple Vitamins-Calcium (ONE-A-DAY WOMENS PO) Take 1 tablet by mouth daily.      . naproxen (NAPROSYN) 500 MG tablet Take 1 tablet (500 mg total) by mouth 2 (two) times daily with a meal. 60 tablet 5  . omega-3 acid ethyl esters (LOVAZA) 1 G capsule Take by mouth 2 (two) times daily.    . pravastatin (PRAVACHOL) 40 MG tablet Take 40 mg by mouth daily.     No current facility-administered medications for this visit.      Physical Exam  Blood pressure (!) 169/92, pulse 63, temperature 97.5 F (36.4 C), height 5\' 5"  (1.651 m), weight 216 lb (98 kg).  Constitutional: overall normal hygiene, normal nutrition, well developed, normal grooming, normal body habitus. Assistive device:none  Musculoskeletal: gait and station Limp left, muscle tone and strength are normal, no tremors or atrophy is present.  .  Neurological: coordination overall normal.  Deep tendon reflex/nerve stretch intact.  Sensation normal.  Cranial nerves II-XII intact.   Skin:   Normal overall no scars, lesions, ulcers or rashes. No psoriasis.  Psychiatric: Alert and oriented x 3.  Recent memory intact, remote memory unclear.  Normal mood and affect. Well groomed.  Good eye contact.  Cardiovascular: overall no swelling, no varicosities, no edema bilaterally, normal temperatures of the  legs and arms, no clubbing, cyanosis and good capillary refill.  Lymphatic: palpation is normal.  The left lower extremity is examined:  Inspection:  Thigh:  Non-tender and no defects  Knee has swelling 1+ effusion.                        Joint tenderness is present                        Patient is tender over the medial joint line  Lower Leg:  Has normal appearance and no tenderness or defects  Ankle:  Non-tender and no defects  Foot:  Non-tender and no defects Range of Motion:  Knee:  Range of motion is: 0-110                        Crepitus is  present  Ankle:  Range  of motion is normal. Strength and Tone:  The left lower extremity has normal strength and tone. Stability:  Knee:  The knee is stable.  Ankle:  The ankle is stable.    The patient has been educated about the nature of the problem(s) and counseled on treatment options.  The patient appeared to understand what I have discussed and is in agreement with it.  Encounter Diagnosis  Name Primary?  . Chronic pain of left knee Yes    PLAN Call if any problems.  Precautions discussed.  Continue current medications.   Return to clinic 1 month   I have reviewed the [facility] web site prior to prescribing narcotic medicine for this patient.  Electronically Signed Sanjuana Kava, MD 2/13/20189:47 AM

## 2016-07-31 ENCOUNTER — Ambulatory Visit: Payer: Medicare Other | Admitting: Orthopaedic Surgery

## 2016-08-02 ENCOUNTER — Telehealth: Payer: Self-pay | Admitting: Orthopaedic Surgery

## 2016-08-02 MED ORDER — HYDROCODONE-ACETAMINOPHEN 5-325 MG PO TABS: 1.0000 | ORAL_TABLET | Freq: Four times a day (QID) | ORAL | 0 refills | Status: DC | PRN

## 2016-08-02 NOTE — Telephone Encounter (Signed)
Patient requests refill on Hydrocodone/Acetaminophen 5-325 mgs.   Qty  90   Sig: Take 1 tablet by mouth every 6 (six) hours as needed for moderate pain (Must last 30 days.Do not take and drive a car or use machinery.).

## 2016-08-07 ENCOUNTER — Ambulatory Visit: Payer: Medicare Other | Admitting: Orthopaedic Surgery

## 2016-08-08 ENCOUNTER — Ambulatory Visit (INDEPENDENT_AMBULATORY_CARE_PROVIDER_SITE_OTHER): Payer: Medicare Other | Admitting: Orthopaedic Surgery

## 2016-08-08 ENCOUNTER — Encounter: Payer: Self-pay | Admitting: Orthopaedic Surgery

## 2016-08-08 VITALS — BP 145/83 | HR 66 | Ht 67.0 in | Wt 218.0 lb

## 2016-08-08 DIAGNOSIS — G8929 Other chronic pain: Secondary | ICD-10-CM

## 2016-08-08 DIAGNOSIS — M25562 Pain in left knee: Secondary | ICD-10-CM | POA: Diagnosis not present

## 2016-08-08 NOTE — Progress Notes (Signed)
Patient [id number] [name], female DOB:05-03-1956, 61 y.o. [id number]  Chief Complaint  Patient presents with  . Follow-up    chronic pain left knee    HPI  Jeanette Compton is a 61 y.o. female who has chronic pain of the left knee.  She is doing well. She has less swelling and no locking or giving way.  She has no redness. She still has popping of the knee.  She is active and has no new trauma. HPI  Body mass index is 34.14 kg/m.  ROS  Review of Systems  HENT: Negative for congestion.   Respiratory: Negative for cough and shortness of breath.   Cardiovascular: Negative for chest pain and leg swelling.  Endocrine: Positive for cold intolerance.  Musculoskeletal: Positive for arthralgias, gait problem and joint swelling.  Allergic/Immunologic: Positive for environmental allergies.  Neurological: Negative for numbness.  All other systems reviewed and are negative.   Past Medical History:  Diagnosis Date  . Bronchitis   . Hypertension   . Hyperthyroidism     Past Surgical History:  Procedure Laterality Date  . ABDOMINAL HYSTERECTOMY    . BREAST SURGERY    . COLONOSCOPY  09/13/2011   Procedure: COLONOSCOPY;  Surgeon: Rogene Houston, MD;  Location: [facility];  Service: Endoscopy;  Laterality: N/A;  1030    Family History  Problem Relation Age of Onset  . Colon cancer Neg Hx     Social History Social History  Substance Use Topics  . Smoking status: Former Smoker    Packs/day: 0.50    Years: 3.00  . Smokeless tobacco: Never Used  . Alcohol use No    No Known Allergies  Current Outpatient Prescriptions  Medication Sig Dispense Refill  . aspirin 81 MG tablet Take 81 mg by mouth daily.    Marland Kitchen HYDROcodone-acetaminophen (NORCO/VICODIN) 5-325 MG tablet Take 1 tablet by mouth every 6 (six) hours as needed for moderate pain (Must last 30 days.Do not take and drive a car or use machinery.). 70 tablet 0  . losartan (COZAAR) 25 MG tablet Take 25 mg by mouth daily.  1   . meloxicam (MOBIC) 15 MG tablet Take 15 mg by mouth daily as needed for pain.     . Multiple Vitamins-Calcium (ONE-A-DAY WOMENS PO) Take 1 tablet by mouth daily.      . naproxen (NAPROSYN) 500 MG tablet Take 1 tablet (500 mg total) by mouth 2 (two) times daily with a meal. 60 tablet 5  . omega-3 acid ethyl esters (LOVAZA) 1 G capsule Take by mouth 2 (two) times daily.    . pravastatin (PRAVACHOL) 40 MG tablet Take 40 mg by mouth daily.     No current facility-administered medications for this visit.      Physical Exam  Blood pressure (!) 145/83, pulse 66, height 5\' 7"  (1.702 m), weight 218 lb (98.9 kg).  Constitutional: overall normal hygiene, normal nutrition, well developed, normal grooming, normal body habitus. Assistive device:none  Musculoskeletal: gait and station Limp none, muscle tone and strength are normal, no tremors or atrophy is present.  .  Neurological: coordination overall normal.  Deep tendon reflex/nerve stretch intact.  Sensation normal.  Cranial nerves II-XII intact.   Skin:   Normal overall no scars, lesions, ulcers or rashes. No psoriasis.  Psychiatric: Alert and oriented x 3.  Recent memory intact, remote memory unclear.  Normal mood and affect. Well groomed.  Good eye contact.  Cardiovascular: overall no swelling, no varicosities, no edema bilaterally,  normal temperatures of the legs and arms, no clubbing, cyanosis and good capillary refill.  Lymphatic: palpation is normal.  The left lower extremity is examined:  Inspection:  Thigh:  Non-tender and no defects  Knee has swelling 1+ effusion.                        Joint tenderness is present                        Patient is tender over the medial joint line  Lower Leg:  Has normal appearance and no tenderness or defects  Ankle:  Non-tender and no defects  Foot:  Non-tender and no defects Range of Motion:  Knee:  Range of motion is: 0-110                        Crepitus is  present  Ankle:  Range of  motion is normal. Strength and Tone:  The left lower extremity has normal strength and tone. Stability:  Knee:  The knee is stable.  Ankle:  The ankle is stable.    The patient has been educated about the nature of the problem(s) and counseled on treatment options.  The patient appeared to understand what I have discussed and is in agreement with it.  Encounter Diagnosis  Name Primary?  . Chronic pain of left knee Yes    PLAN Call if any problems.  Precautions discussed.  Continue current medications.   Return to clinic 2 months   Electronically Signed Sanjuana Kava, MD 3/21/20188:28 AM

## 2016-08-30 ENCOUNTER — Ambulatory Visit: Payer: Medicare Other | Admitting: Family Medicine

## 2016-09-03 ENCOUNTER — Telehealth: Payer: Self-pay

## 2016-09-03 NOTE — Telephone Encounter (Signed)
Request rx for Hydrocodone

## 2016-09-04 MED ORDER — HYDROCODONE-ACETAMINOPHEN 5-325 MG PO TABS: 1.0000 | ORAL_TABLET | Freq: Four times a day (QID) | ORAL | 0 refills | Status: DC | PRN

## 2016-09-10 ENCOUNTER — Ambulatory Visit (INDEPENDENT_AMBULATORY_CARE_PROVIDER_SITE_OTHER): Payer: Medicare Other | Admitting: Family Medicine

## 2016-09-10 ENCOUNTER — Encounter: Payer: Self-pay | Admitting: Family Medicine

## 2016-09-10 VITALS — BP 168/88 | HR 72 | Temp 97.0°F | Resp 18 | Ht 67.0 in | Wt 217.0 lb

## 2016-09-10 DIAGNOSIS — M179 Osteoarthritis of knee, unspecified: Secondary | ICD-10-CM | POA: Insufficient documentation

## 2016-09-10 DIAGNOSIS — E785 Hyperlipidemia, unspecified: Secondary | ICD-10-CM | POA: Insufficient documentation

## 2016-09-10 DIAGNOSIS — M1712 Unilateral primary osteoarthritis, left knee: Secondary | ICD-10-CM | POA: Diagnosis not present

## 2016-09-10 DIAGNOSIS — M545 Low back pain: Secondary | ICD-10-CM

## 2016-09-10 DIAGNOSIS — G8929 Other chronic pain: Secondary | ICD-10-CM

## 2016-09-10 DIAGNOSIS — L80 Vitiligo: Secondary | ICD-10-CM | POA: Diagnosis not present

## 2016-09-10 DIAGNOSIS — I10 Essential (primary) hypertension: Secondary | ICD-10-CM | POA: Insufficient documentation

## 2016-09-10 DIAGNOSIS — M1710 Unilateral primary osteoarthritis, unspecified knee: Secondary | ICD-10-CM | POA: Insufficient documentation

## 2016-09-10 DIAGNOSIS — Z7689 Persons encountering health services in other specified circumstances: Secondary | ICD-10-CM | POA: Diagnosis not present

## 2016-09-10 MED ORDER — LOSARTAN POTASSIUM 25 MG PO TABS: 25.0000 mg | ORAL_TABLET | Freq: Every day | ORAL | 11 refills | Status: DC

## 2016-09-10 MED ORDER — PRAVASTATIN SODIUM 40 MG PO TABS: 40.0000 mg | ORAL_TABLET | Freq: Every day | ORAL | 11 refills | Status: DC

## 2016-09-10 NOTE — Patient Instructions (Signed)
Medicines are refilled Continue to exercise daily Need old records Need flu shot in the fall See me in six months Need labs today I will send you a letter with your test results.  If there is anything of concern, we will call right away.

## 2016-09-10 NOTE — Progress Notes (Signed)
Chief Complaint  Patient presents with  . Establish Care   Patient is to establish. She states her prior primary care office closed. She has not had any blood pressure medicine or cholesterol medicine for several months. She feels well. She feels like her heart care is otherwise up-to-date. Mammograms up-to-date. Never had a colonoscopy though. She does get yearly flu shots. Tetanus shot 7 years ago. She states that she eats well. She goes to the gym regularly and tries to exercise. She tells me that she is disabled from arthritis in her left knee and left ankle. She also has chronic back pain. She takes chronic narcotics. She does not appear to be in any discomfort and moves well.   Patient Active Problem List   Diagnosis Date Noted  . HLD (hyperlipidemia) 09/10/2016  . Essential hypertension 09/10/2016  . Primary vitiligo 09/10/2016  . Osteoarthritis of knee 09/10/2016  . Chronic lower back pain 09/10/2016    Outpatient Encounter Prescriptions as of 09/10/2016  Medication Sig  . aspirin 81 MG tablet Take 81 mg by mouth daily.  Marland Kitchen HYDROcodone-acetaminophen (NORCO/VICODIN) 5-325 MG tablet Take 1 tablet by mouth every 6 (six) hours as needed for moderate pain (Must last 30 days.Do not take and drive a car or use machinery.).  Marland Kitchen losartan (COZAAR) 25 MG tablet Take 1 tablet (25 mg total) by mouth daily.  . Multiple Vitamins-Calcium (ONE-A-DAY WOMENS PO) Take 1 tablet by mouth daily.     No facility-administered encounter medications on file as of 09/10/2016.     Past Medical History:  Diagnosis Date  . Arthritis    knee , ankle and back  . Bronchitis   . Cataract   . Hyperlipidemia   . Hypertension   . Hyperthyroidism     Past Surgical History:  Procedure Laterality Date  . ABDOMINAL HYSTERECTOMY     fibroid  . BREAST SURGERY    . COLONOSCOPY  09/13/2011   Procedure: COLONOSCOPY;  Surgeon: Rogene Houston, MD;  Location: [facility];  Service: Endoscopy;  Laterality:  N/A;  1030    Social History   Social History  . Marital status: Single    Spouse name: N/A  . Number of children: 3  . Years of education: 10   Occupational History  . disability    Social History Main Topics  . Smoking status: Former Smoker    Packs/day: 0.50    Years: 3.00  . Smokeless tobacco: Never Used  . Alcohol use No  . Drug use: No  . Sexual activity: No   Other Topics Concern  . Not on file   Social History Narrative   Lives with grand daughter - 60   Goes to [place] and work out       Family History  Problem Relation Age of Onset  . Stroke Mother   . Diabetes Mother   . Alcohol abuse Father   . Early death Father   . Early death Sister     pneumonia  . Cancer Brother   . Alzheimer's disease Sister   . COPD Brother   . Colon cancer Neg Hx     Review of Systems  Constitutional: Negative for chills, fever and weight loss.  HENT: Negative for congestion and hearing loss.   Eyes: Negative for blurred vision and pain.  Respiratory: Negative for cough and shortness of breath.   Cardiovascular: Negative for chest pain and leg swelling.  Gastrointestinal: Negative for abdominal pain, constipation, diarrhea and  heartburn.  Genitourinary: Negative for dysuria and frequency.  Musculoskeletal: Positive for back pain and joint pain. Negative for falls and myalgias.  Neurological: Negative for dizziness, seizures and headaches.  Psychiatric/Behavioral: Negative for depression. The patient is not nervous/anxious and does not have insomnia.     BP (!) 168/88 (BP Location: Right Arm, Patient Position: Sitting, Cuff Size: Normal)   Pulse 72   Temp 97 F (36.1 C) (Temporal)   Resp 18   Ht 5\' 7"  (1.702 m)   Wt 217 lb 0.6 oz (98.4 kg)   SpO2 99%   BMI 33.99 kg/m   Physical Exam  Constitutional: She is oriented to person, place, and time. She appears well-developed and well-nourished.  HENT:  Head: Normocephalic and atraumatic.  Right Ear: External ear  normal.  Left Ear: External ear normal.  Mouth/Throat: Oropharynx is clear and moist.  Eyes: Conjunctivae are normal. Pupils are equal, round, and reactive to light.  Neck: Normal range of motion. Neck supple. No thyromegaly present.  Cardiovascular: Normal rate, regular rhythm and normal heart sounds.   Pulmonary/Chest: Effort normal and breath sounds normal. No respiratory distress.  Abdominal: Soft. Bowel sounds are normal.  Musculoskeletal: Normal range of motion. She exhibits no edema.  Lymphadenopathy:    She has no cervical adenopathy.  Neurological: She is alert and oriented to person, place, and time.  Gait normal  Skin: Skin is warm and dry.  Psychiatric: She has a normal mood and affect. Her behavior is normal. Thought content normal.  Nursing note and vitals reviewed. ASSESSMENT/PLAN:   1. Hyperlipidemia, unspecified hyperlipidemia type   2. Essential hypertension  - CBC - Comprehensive metabolic panel - Lipid panel - Urinalysis, Routine w reflex microscopic - TSH  3. Primary vitiligo   4. Primary osteoarthritis of left knee   5. Chronic bilateral low back pain without sciatica   Patient Instructions  Medicines are refilled Continue to exercise daily Need old records Need flu shot in the fall See me in six months Need labs today I will send you a letter with your test results.  If there is anything of concern, we will call right away.      Raylene Everts, MD

## 2016-09-11 ENCOUNTER — Encounter: Payer: Self-pay | Admitting: Family Medicine

## 2016-09-11 LAB — URINALYSIS, MICROSCOPIC ONLY
BACTERIA UA: NONE SEEN [HPF]
Casts: NONE SEEN [LPF]
RBC / HPF: NONE SEEN RBC/HPF (ref <=2)
WBC, UA: NONE SEEN WBC/HPF (ref <=5)
YEAST: NONE SEEN [HPF]

## 2016-09-11 LAB — CBC
HEMATOCRIT: 39.9 % (ref 35.0–45.0)
Hemoglobin: 12.7 g/dL (ref 11.7–15.5)
MCH: 27 pg (ref 27.0–33.0)
MCHC: 31.8 g/dL — AB (ref 32.0–36.0)
MCV: 84.7 fL (ref 80.0–100.0)
MPV: 9.8 fL (ref 7.5–12.5)
PLATELETS: 347 10*3/uL (ref 140–400)
RBC: 4.71 MIL/uL (ref 3.80–5.10)
RDW: 14.5 % (ref 11.0–15.0)
WBC: 7.6 10*3/uL (ref 3.8–10.8)

## 2016-09-11 LAB — LIPID PANEL
Cholesterol: 215 mg/dL — ABNORMAL HIGH (ref <200)
HDL: 56 mg/dL (ref >50)
LDL Cholesterol: 114 mg/dL — ABNORMAL HIGH (ref <100)
Total CHOL/HDL Ratio: 3.8 Ratio (ref <5.0)
Triglycerides: 226 mg/dL — ABNORMAL HIGH (ref <150)
VLDL: 45 mg/dL — ABNORMAL HIGH (ref <30)

## 2016-09-11 LAB — URINALYSIS, ROUTINE W REFLEX MICROSCOPIC
BILIRUBIN URINE: NEGATIVE
GLUCOSE, UA: NEGATIVE
Ketones, ur: NEGATIVE
Leukocytes, UA: NEGATIVE
Nitrite: NEGATIVE
Protein, ur: NEGATIVE
SPECIFIC GRAVITY, URINE: 1.021 (ref 1.001–1.035)
pH: 6 (ref 5.0–8.0)

## 2016-09-11 LAB — COMPREHENSIVE METABOLIC PANEL
ALT: 10 U/L (ref 6–29)
AST: 14 U/L (ref 10–35)
Albumin: 4.2 g/dL (ref 3.6–5.1)
Alkaline Phosphatase: 83 U/L (ref 33–130)
BILIRUBIN TOTAL: 0.4 mg/dL (ref 0.2–1.2)
BUN: 16 mg/dL (ref 7–25)
CO2: 31 mmol/L (ref 20–31)
Calcium: 10.1 mg/dL (ref 8.6–10.4)
Chloride: 103 mmol/L (ref 98–110)
Creat: 0.79 mg/dL (ref 0.50–1.05)
Glucose, Bld: 128 mg/dL — ABNORMAL HIGH (ref 65–99)
Potassium: 4 mmol/L (ref 3.5–5.3)
Sodium: 142 mmol/L (ref 135–146)
Total Protein: 7.8 g/dL (ref 6.1–8.1)

## 2016-09-11 LAB — TSH: TSH: 5 mIU/L — ABNORMAL HIGH

## 2016-09-27 ENCOUNTER — Encounter (INDEPENDENT_AMBULATORY_CARE_PROVIDER_SITE_OTHER): Payer: Self-pay | Admitting: *Deleted

## 2016-10-02 ENCOUNTER — Emergency Department (HOSPITAL_COMMUNITY)
Admission: EM | Admit: 2016-10-02 | Discharge: 2016-10-02 | Disposition: A | Discharge status: Home / Self Care | Payer: Medicare Other | Attending: Emergency Medicine | Admitting: Emergency Medicine

## 2016-10-02 ENCOUNTER — Encounter (HOSPITAL_COMMUNITY): Payer: Self-pay | Admitting: Nurse Practitioner

## 2016-10-02 DIAGNOSIS — I10 Essential (primary) hypertension: Secondary | ICD-10-CM | POA: Insufficient documentation

## 2016-10-02 DIAGNOSIS — Z87891 Personal history of nicotine dependence: Secondary | ICD-10-CM | POA: Insufficient documentation

## 2016-10-02 DIAGNOSIS — Z7982 Long term (current) use of aspirin: Secondary | ICD-10-CM | POA: Insufficient documentation

## 2016-10-02 DIAGNOSIS — Z91018 Allergy to other foods: Secondary | ICD-10-CM | POA: Insufficient documentation

## 2016-10-02 DIAGNOSIS — T781XXA Other adverse food reactions, not elsewhere classified, initial encounter: Secondary | ICD-10-CM | POA: Diagnosis present

## 2016-10-02 MED ORDER — PREDNISONE 10 MG PO TABS: 20.0000 mg | ORAL_TABLET | Freq: Two times a day (BID) | ORAL | 0 refills | Status: DC

## 2016-10-02 MED ORDER — DIPHENHYDRAMINE HCL 50 MG/ML IJ SOLN
25.0000 mg | Freq: Once | INTRAMUSCULAR | Status: AC
  Administered 2016-10-02: 25 mg via INTRAVENOUS
  Filled 2016-10-02: qty 1

## 2016-10-02 MED ORDER — DIPHENHYDRAMINE HCL 25 MG PO TABS: 25.0000 mg | ORAL_TABLET | Freq: Four times a day (QID) | ORAL | 0 refills | Status: DC

## 2016-10-02 MED ORDER — FAMOTIDINE IN NACL 20-0.9 MG/50ML-% IV SOLN
20.0000 mg | Freq: Once | INTRAVENOUS | Status: AC
  Administered 2016-10-02: 20 mg via INTRAVENOUS
  Filled 2016-10-02: qty 50

## 2016-10-02 MED ORDER — FAMOTIDINE 20 MG PO TABS: 20.0000 mg | ORAL_TABLET | Freq: Two times a day (BID) | ORAL | 0 refills | Status: DC

## 2016-10-02 MED ORDER — SODIUM CHLORIDE 0.9 % IV SOLN
INTRAVENOUS | Status: DC
  Administered 2016-10-02: 19:00:00 via INTRAVENOUS

## 2016-10-02 MED ORDER — METHYLPREDNISOLONE SODIUM SUCC 125 MG IJ SOLR
125.0000 mg | Freq: Once | INTRAMUSCULAR | Status: AC
  Administered 2016-10-02: 125 mg via INTRAVENOUS
  Filled 2016-10-02: qty 2

## 2016-10-02 NOTE — ED Provider Notes (Signed)
[facility] DEPT Provider Note   CSN: [id number] Arrival date & time: 10/02/16  1630  By signing my name below, I, Jeanette Compton, attest that this documentation has been prepared under the direction and in the presence of non-physician practitioner, [facility] M. Janit Bern, NP. Electronically Signed: Dora Compton, Scribe. 10/02/2016. 6:07 PM.  History   Chief Complaint Chief Complaint  Patient presents with  . Allergic Reaction   The history is provided by the patient. No language interpreter was used.  Allergic Reaction  Presenting symptoms: rash   Presenting symptoms: no difficulty swallowing   Severity:  Moderate Duration:  3 hours Prior allergic episodes:  No prior episodes Context: food   Relieved by:  Nothing Worsened by:  Nothing Ineffective treatments:  Antihistamines   HPI Comments: Jeanette Compton is a 61 y.o. female with PMHx including HTN, HLD, and bronchitis who presents to the Emergency Department for evaluation of an allergic reaction beginning about 3 hours ago. She states she ate a supreme pizza from a new restaurant this afternoon and has developed swelling in her hands and a generalized pruritic rash since that time. She tried Benadryl x2 PTA with mild improvement of her itching. Patient denies dyspnea, throat swelling, trouble swallowing, or any other associated symptoms.  Past Medical History:  Diagnosis Date  . Arthritis    knee , ankle and back  . Bronchitis   . Cataract   . Hyperlipidemia   . Hypertension   . Hyperthyroidism     Patient Active Problem List   Diagnosis Date Noted  . HLD (hyperlipidemia) 09/10/2016  . Essential hypertension 09/10/2016  . Primary vitiligo 09/10/2016  . Osteoarthritis of knee 09/10/2016  . Chronic lower back pain 09/10/2016    Past Surgical History:  Procedure Laterality Date  . ABDOMINAL HYSTERECTOMY     fibroid  . BREAST SURGERY    . COLONOSCOPY  09/13/2011   Procedure: COLONOSCOPY;  Surgeon: Rogene Houston, MD;   Location: [facility];  Service: Endoscopy;  Laterality: N/A;  1030    OB History    No data available       Home Medications    Prior to Admission medications   Medication Sig Start Date End Date Taking? Authorizing Provider  aspirin 81 MG tablet Take 81 mg by mouth daily.    [provider]  diphenhydrAMINE (BENADRYL) 25 MG tablet Take 1 tablet (25 mg total) by mouth every 6 (six) hours. 10/02/16   Ashley Murrain, NP  famotidine (PEPCID) 20 MG tablet Take 1 tablet (20 mg total) by mouth 2 (two) times daily. 10/02/16   Ashley Murrain, NP  HYDROcodone-acetaminophen (NORCO/VICODIN) 5-325 MG tablet Take 1 tablet by mouth every 6 (six) hours as needed for moderate pain (Must last 30 days.Do not take and drive a car or use machinery.). 09/04/16   Sanjuana Kava, MD  losartan (COZAAR) 25 MG tablet Take 1 tablet (25 mg total) by mouth daily. 09/10/16   Raylene Everts, MD  Multiple Vitamins-Calcium (ONE-A-DAY WOMENS PO) Take 1 tablet by mouth daily.      [provider]  pravastatin (PRAVACHOL) 40 MG tablet Take 1 tablet (40 mg total) by mouth daily. 09/10/16   Raylene Everts, MD  predniSONE (DELTASONE) 10 MG tablet Take 2 tablets (20 mg total) by mouth 2 (two) times daily with a meal. 10/02/16   Ashley Murrain, NP    Family History Family History  Problem Relation Age of Onset  . Stroke Mother   .  Diabetes Mother   . Alcohol abuse Father   . Early death Father   . Early death Sister        pneumonia  . Cancer Brother   . Alzheimer's disease Sister   . COPD Brother   . Colon cancer Neg Hx     Social History Social History  Substance Use Topics  . Smoking status: Former Smoker    Packs/day: 0.50    Years: 3.00  . Smokeless tobacco: Never Used  . Alcohol use No     Allergies   Patient has no known allergies.   Review of Systems Review of Systems  HENT: Negative for trouble swallowing.        Negative for oral swelling.  Respiratory: Negative for  shortness of breath.   Musculoskeletal: Positive for joint swelling.  Skin: Positive for rash.  All other systems reviewed and are negative.  Physical Exam Updated Vital Signs BP (!) 159/75   Pulse 87   Temp 98.2 F (36.8 C) (Oral)   Resp 17   SpO2 97%   Physical Exam  Constitutional: She is oriented to person, place, and time. She appears well-developed and well-nourished. No distress.  HENT:  Head: Normocephalic and atraumatic.  Mouth/Throat: Uvula is midline and mucous membranes are normal. No posterior oropharyngeal edema or posterior oropharyngeal erythema.  Eyes: Conjunctivae and EOM are normal.  Neck: Neck supple. No tracheal deviation present.  Cardiovascular: Normal rate and regular rhythm.   Pulmonary/Chest: Effort normal and breath sounds normal. No respiratory distress.  Abdominal: There is no tenderness.  Musculoskeletal: Normal range of motion.  Swelling in bilateral hands.  Neurological: She is alert and oriented to person, place, and time.  Skin: Skin is warm and dry.  Generalized hives.  Psychiatric: She has a normal mood and affect. Her behavior is normal.  Nursing note and vitals reviewed.  ED Treatments / Results  Labs (all labs ordered are listed, but only abnormal results are displayed) Labs Reviewed - No data to display  EKG  EKG Interpretation None       Radiology No results found.  Procedures Procedures (including critical care time)  DIAGNOSTIC STUDIES: Oxygen Saturation is 97% on RA, normal by my interpretation.    COORDINATION OF CARE: 6:05 PM Discussed treatment plan with pt at bedside and pt agreed to plan.  Medications Ordered in ED Medications  methylPREDNISolone sodium succinate (SOLU-MEDROL) 125 mg/2 mL injection 125 mg (125 mg Intravenous Given 10/02/16 1852)  famotidine (PEPCID) IVPB 20 mg premix (0 mg Intravenous Stopped 10/02/16 1925)  diphenhydrAMINE (BENADRYL) injection 25 mg (25 mg Intravenous Given 10/02/16 1852)      Initial Impression / Assessment and Plan / ED Course  I have reviewed the triage vital signs and the nursing notes. 8:51 PM: Patient's symptoms have improved greatly since receiving fluids, Solumedrol, Pepcid, and Benadryl in the ED. She reports minimal itching. Swelling in her hands has improved.  Patient re-evaluated prior to dc, is hemodynamically stable, in no respiratory distress, and denies the feeling of throat closing, normal phonation. No wheezing, no vomiting, no syncope. Discussed signs and symptoms of anaphylaxis and severe allergic reaction. Pt advised to return for any worsening in symptoms or any concerns. Pt treated with steroids, benadryl and pepcid. Pt is to follow up with their PCP. Pt is agreeable with plan & verbalizes understanding.   Final Clinical Impressions(s) / ED Diagnoses   Final diagnoses:  Allergic reaction to food, initial encounter    New Prescriptions  Discharge Medication List as of 10/02/2016  8:25 PM    START taking these medications   Details  diphenhydrAMINE (BENADRYL) 25 MG tablet Take 1 tablet (25 mg total) by mouth every 6 (six) hours., Starting Tue 10/02/2016, Print    famotidine (PEPCID) 20 MG tablet Take 1 tablet (20 mg total) by mouth 2 (two) times daily., Starting Tue 10/02/2016, Print    predniSONE (DELTASONE) 10 MG tablet Take 2 tablets (20 mg total) by mouth 2 (two) times daily with a meal., Starting Tue 10/02/2016, Print       I personally performed the services described in this documentation, which was scribed in my presence. The recorded information has been reviewed and is accurate.    Debroah Baller [place], [place] 10/04/16 Ernestine Mcmurray    Charlesetta Shanks, MD 10/06/16 [phone]

## 2016-10-02 NOTE — ED Notes (Signed)
Pt has rash all over her body and her hands and feet are swollen. Pt stated she can breath just fine and swallow fine. Pt stated she had eaten at a place and about 35 to 45 mins her mouth began to itch and then went down to her hands and feet and now all over.

## 2016-10-02 NOTE — ED Triage Notes (Addendum)
Pt presents with c/o allergic reaction . She c/o itchy hives to her entire body. her symptoms began this afternoon after eating pizza.  She denies oral swelling or difficulty breathing. Her symptoms have improved since onset.

## 2016-10-02 NOTE — Discharge Instructions (Signed)
Take the medication as directed. The benadryl can make you sleepy.

## 2016-10-04 ENCOUNTER — Encounter: Payer: Self-pay | Admitting: Orthopaedic Surgery

## 2016-10-04 ENCOUNTER — Ambulatory Visit (INDEPENDENT_AMBULATORY_CARE_PROVIDER_SITE_OTHER): Payer: Medicare Other | Admitting: Orthopaedic Surgery

## 2016-10-04 VITALS — BP 152/82 | HR 55 | Temp 97.5°F | Resp 18 | Ht 66.0 in | Wt 212.0 lb

## 2016-10-04 DIAGNOSIS — G8929 Other chronic pain: Secondary | ICD-10-CM | POA: Diagnosis not present

## 2016-10-04 DIAGNOSIS — M25562 Pain in left knee: Secondary | ICD-10-CM

## 2016-10-04 MED ORDER — HYDROCODONE-ACETAMINOPHEN 5-325 MG PO TABS: 1.0000 | ORAL_TABLET | Freq: Four times a day (QID) | ORAL | 0 refills | Status: DC | PRN

## 2016-10-04 NOTE — Progress Notes (Signed)
Patient Jeanette Compton Jeanette Compton, female DOB:May 25, 1955, 61 y.o. JJH:[id number]  Chief Complaint  Patient presents with  . Follow-up    Recheck on left knee pain.    HPI  Jeanette Compton is a 61 y.o. female who has chronic pain of the left knee. She has more pain with marked activity.  She has swelling and popping but no locking or giving way.  She has no new trauma. HPI  Body mass index is 34.22 kg/m.  ROS  Review of Systems  HENT: Negative for congestion.   Respiratory: Negative for cough and shortness of breath.   Cardiovascular: Negative for chest pain and leg swelling.  Endocrine: Positive for cold intolerance.  Musculoskeletal: Positive for arthralgias, gait problem and joint swelling.  Allergic/Immunologic: Positive for environmental allergies.  Neurological: Negative for numbness.  All other systems reviewed and are negative.   Past Medical History:  Diagnosis Date  . Arthritis    knee , ankle and back  . Bronchitis   . Cataract   . Hyperlipidemia   . Hypertension   . Hyperthyroidism     Past Surgical History:  Procedure Laterality Date  . ABDOMINAL HYSTERECTOMY     fibroid  . BREAST SURGERY    . COLONOSCOPY  09/13/2011   Procedure: COLONOSCOPY;  Surgeon: Rogene Houston, MD;  Location: [facility];  Service: Endoscopy;  Laterality: N/A;  1030    Family History  Problem Relation Age of Onset  . Stroke Mother   . Diabetes Mother   . Alcohol abuse Father   . Early death Father   . Early death Sister        pneumonia  . Cancer Brother   . Alzheimer's disease Sister   . COPD Brother   . Colon cancer Neg Hx     Social History Social History  Substance Use Topics  . Smoking status: Former Smoker    Packs/day: 0.50    Years: 3.00  . Smokeless tobacco: Never Used  . Alcohol use No    No Known Allergies  Current Outpatient Prescriptions  Medication Sig Dispense Refill  . aspirin 81 MG tablet Take 81 mg by mouth daily.    . diphenhydrAMINE (BENADRYL)  25 MG tablet Take 1 tablet (25 mg total) by mouth every 6 (six) hours. 20 tablet 0  . famotidine (PEPCID) 20 MG tablet Take 1 tablet (20 mg total) by mouth 2 (two) times daily. 30 tablet 0  . HYDROcodone-acetaminophen (NORCO/VICODIN) 5-325 MG tablet Take 1 tablet by mouth every 6 (six) hours as needed for moderate pain (Must last 30 days.Do not take and drive a car or use machinery.). 55 tablet 0  . losartan (COZAAR) 25 MG tablet Take 1 tablet (25 mg total) by mouth daily. 30 tablet 11  . Multiple Vitamins-Calcium (ONE-A-DAY WOMENS PO) Take 1 tablet by mouth daily.      . pravastatin (PRAVACHOL) 40 MG tablet Take 1 tablet (40 mg total) by mouth daily. 30 tablet 11  . predniSONE (DELTASONE) 10 MG tablet Take 2 tablets (20 mg total) by mouth 2 (two) times daily with a meal. 16 tablet 0   No current facility-administered medications for this visit.      Physical Exam  Blood pressure (!) 152/82, pulse (!) 55, temperature 97.5 F (36.4 C), resp. rate 18, height 5\' 6"  (1.676 m), weight 212 lb (96.2 kg).  Constitutional: overall normal hygiene, normal nutrition, well developed, normal grooming, normal body habitus. Assistive device:none  Musculoskeletal: gait and station  Limp left, muscle tone and strength are normal, no tremors or atrophy is present.  .  Neurological: coordination overall normal.  Deep tendon reflex/nerve stretch intact.  Sensation normal.  Cranial nerves II-XII intact.   Skin:   Normal overall no scars, lesions, ulcers or rashes. No psoriasis.  Psychiatric: Alert and oriented x 3.  Recent memory intact, remote memory unclear.  Normal mood and affect. Well groomed.  Good eye contact.  Cardiovascular: overall no swelling, no varicosities, no edema bilaterally, normal temperatures of the legs and arms, no clubbing, cyanosis and good capillary refill.  Lymphatic: palpation is normal.  The left lower extremity is examined:  Inspection:  Thigh:  Non-tender and no  defects  Knee has swelling 1+ effusion.                        Joint tenderness is present                        Patient is tender over the medial joint line  Lower Leg:  Has normal appearance and no tenderness or defects  Ankle:  Non-tender and no defects  Foot:  Non-tender and no defects Range of Motion:  Knee:  Range of motion is: 0-110                        Crepitus is  present  Ankle:  Range of motion is normal. Strength and Tone:  The left lower extremity has normal strength and tone. Stability:  Knee:  The knee is stable.  Ankle:  The ankle is stable.    The patient has been educated about the nature of the problem(s) and counseled on treatment options.  The patient appeared to understand what I have discussed and is in agreement with it.  Encounter Diagnosis  Name Primary?  . Chronic pain of left knee Yes    PLAN Call if any problems.  Precautions discussed.  Continue current medications.   Return to clinic 3 months   I have reviewed the [place] web site prior to prescribing narcotic medicine for this patient.  Electronically Signed Sanjuana Kava, MD 5/17/20189:07 AM

## 2016-10-05 ENCOUNTER — Other Ambulatory Visit (INDEPENDENT_AMBULATORY_CARE_PROVIDER_SITE_OTHER): Payer: Self-pay | Admitting: *Deleted

## 2016-10-05 DIAGNOSIS — Z86010 Personal history of colonic polyps: Secondary | ICD-10-CM

## 2016-10-09 ENCOUNTER — Ambulatory Visit: Payer: Medicare Other | Admitting: Orthopaedic Surgery

## 2016-10-23 ENCOUNTER — Telehealth: Payer: Self-pay | Admitting: Family Medicine

## 2016-10-23 ENCOUNTER — Encounter (HOSPITAL_COMMUNITY): Payer: Self-pay

## 2016-10-23 ENCOUNTER — Emergency Department (HOSPITAL_COMMUNITY)
Admission: EM | Admit: 2016-10-23 | Discharge: 2016-10-23 | Disposition: A | Discharge status: Home / Self Care | Payer: Medicare Other | Attending: Emergency Medicine | Admitting: Emergency Medicine

## 2016-10-23 DIAGNOSIS — E785 Hyperlipidemia, unspecified: Secondary | ICD-10-CM | POA: Insufficient documentation

## 2016-10-23 DIAGNOSIS — Z87891 Personal history of nicotine dependence: Secondary | ICD-10-CM | POA: Insufficient documentation

## 2016-10-23 DIAGNOSIS — Z7982 Long term (current) use of aspirin: Secondary | ICD-10-CM | POA: Diagnosis not present

## 2016-10-23 DIAGNOSIS — B0229 Other postherpetic nervous system involvement: Secondary | ICD-10-CM | POA: Insufficient documentation

## 2016-10-23 DIAGNOSIS — I10 Essential (primary) hypertension: Secondary | ICD-10-CM | POA: Diagnosis not present

## 2016-10-23 DIAGNOSIS — M25551 Pain in right hip: Secondary | ICD-10-CM

## 2016-10-23 DIAGNOSIS — Z79899 Other long term (current) drug therapy: Secondary | ICD-10-CM | POA: Diagnosis not present

## 2016-10-23 HISTORY — DX: Disorder of thyroid, unspecified: E07.9

## 2016-10-23 MED ORDER — GABAPENTIN 100 MG PO CAPS
200.0000 mg | ORAL_CAPSULE | Freq: Once | ORAL | Status: AC
  Administered 2016-10-23: 200 mg via ORAL
  Filled 2016-10-23: qty 2

## 2016-10-23 MED ORDER — DEXAMETHASONE SODIUM PHOSPHATE 4 MG/ML IJ SOLN
8.0000 mg | Freq: Once | INTRAMUSCULAR | Status: AC
  Administered 2016-10-23: 8 mg via INTRAMUSCULAR
  Filled 2016-10-23: qty 2

## 2016-10-23 MED ORDER — ONDANSETRON HCL 4 MG PO TABS
4.0000 mg | ORAL_TABLET | Freq: Once | ORAL | Status: AC
  Administered 2016-10-23: 4 mg via ORAL
  Filled 2016-10-23: qty 1

## 2016-10-23 MED ORDER — KETOROLAC TROMETHAMINE 10 MG PO TABS
10.0000 mg | ORAL_TABLET | Freq: Once | ORAL | Status: AC
  Administered 2016-10-23: 10 mg via ORAL
  Filled 2016-10-23: qty 1

## 2016-10-23 MED ORDER — GABAPENTIN 300 MG PO CAPS: 300.0000 mg | ORAL_CAPSULE | Freq: Three times a day (TID) | ORAL | 0 refills | Status: DC

## 2016-10-23 MED ORDER — DEXAMETHASONE 4 MG PO TABS: 4.0000 mg | ORAL_TABLET | Freq: Two times a day (BID) | ORAL | 0 refills | Status: DC

## 2016-10-23 MED ORDER — ACETAMINOPHEN 500 MG PO TABS
500.0000 mg | ORAL_TABLET | Freq: Once | ORAL | Status: AC
  Administered 2016-10-23: 500 mg via ORAL
  Filled 2016-10-23: qty 1

## 2016-10-23 MED ORDER — DICLOFENAC SODIUM 75 MG PO TBEC: 75.0000 mg | DELAYED_RELEASE_TABLET | Freq: Two times a day (BID) | ORAL | 0 refills | Status: DC

## 2016-10-23 NOTE — ED Triage Notes (Signed)
Pt reports right hip pain for the past 2 weeks. Reports hip feels like it catches and hip feels like it will "give away". No injury but reports excercises

## 2016-10-23 NOTE — Telephone Encounter (Signed)
Patient calling to say that she was seen by ER doctor @ [facility] for R hip pain.  Patient states that they prescribed medications in the ER that will require patient  to be followed closely by her PCP.  Patient is also asking if it is ok to continue with her exercise on the eliptical. Patient is  scheduled for appt Friday.

## 2016-10-23 NOTE — Discharge Instructions (Signed)
Your symptoms sound like a post herpetic neuralgia problem on the left. You probably have arthritis or degenerative changes on the right. Please use the gabapentin 3 times daily. This medication may cause drowsiness, please use it with caution. Please use diclofenac and Decadron daily with food.

## 2016-10-23 NOTE — ED Provider Notes (Signed)
[facility] DEPT Provider Note   CSN: [id number] Arrival date & time: 10/23/16  1002     History   Chief Complaint Chief Complaint  Patient presents with  . Hip Pain    HPI Jeanette Compton is a 61 y.o. female.  Right hip pain started after lifting weights. Hx of shingles on the left side a year ago. Pt continues to have pain at the singles site all day and most of the night.   The history is provided by the patient.  Hip Pain  This is a recurrent problem. The current episode started more than 1 week ago. The problem occurs daily. The problem has been gradually worsening. Pertinent negatives include no chest pain, no abdominal pain and no shortness of breath. The symptoms are aggravated by walking and standing. The symptoms are relieved by rest. Treatments tried: arthritis cream.    Past Medical History:  Diagnosis Date  . Arthritis    knee , ankle and back  . Bronchitis   . Cataract   . Hyperlipidemia   . Hypertension   . Thyroid disease     Patient Active Problem List   Diagnosis Date Noted  . History of colonic polyps 10/05/2016  . HLD (hyperlipidemia) 09/10/2016  . Essential hypertension 09/10/2016  . Primary vitiligo 09/10/2016  . Osteoarthritis of knee 09/10/2016  . Chronic lower back pain 09/10/2016    Past Surgical History:  Procedure Laterality Date  . ABDOMINAL HYSTERECTOMY     fibroid  . BREAST SURGERY    . COLONOSCOPY  09/13/2011   Procedure: COLONOSCOPY;  Surgeon: Rogene Houston, MD;  Location: [facility];  Service: Endoscopy;  Laterality: N/A;  1030    OB History    No data available       Home Medications    Prior to Admission medications   Medication Sig Start Date End Date Taking? Authorizing Provider  aspirin 81 MG tablet Take 81 mg by mouth daily.    [provider]  diphenhydrAMINE (BENADRYL) 25 MG tablet Take 1 tablet (25 mg total) by mouth every 6 (six) hours. 10/02/16   Ashley Murrain, NP  famotidine (PEPCID) 20 MG  tablet Take 1 tablet (20 mg total) by mouth 2 (two) times daily. 10/02/16   Ashley Murrain, NP  HYDROcodone-acetaminophen (NORCO/VICODIN) 5-325 MG tablet Take 1 tablet by mouth every 6 (six) hours as needed for moderate pain (Must last 30 days.Do not take and drive a car or use machinery.). 10/04/16   Sanjuana Kava, MD  losartan (COZAAR) 25 MG tablet Take 1 tablet (25 mg total) by mouth daily. 09/10/16   Raylene Everts, MD  Multiple Vitamins-Calcium (ONE-A-DAY WOMENS PO) Take 1 tablet by mouth daily.      [provider]  pravastatin (PRAVACHOL) 40 MG tablet Take 1 tablet (40 mg total) by mouth daily. 09/10/16   Raylene Everts, MD  predniSONE (DELTASONE) 10 MG tablet Take 2 tablets (20 mg total) by mouth 2 (two) times daily with a meal. 10/02/16   Ashley Murrain, NP    Family History Family History  Problem Relation Age of Onset  . Stroke Mother   . Diabetes Mother   . Alcohol abuse Father   . Early death Father   . Early death Sister        pneumonia  . Cancer Brother   . Alzheimer's disease Sister   . COPD Brother   . Colon cancer Neg Hx  Social History Social History  Substance Use Topics  . Smoking status: Former Smoker    Packs/day: 0.50    Years: 3.00  . Smokeless tobacco: Never Used  . Alcohol use No     Allergies   Patient has no known allergies.   Review of Systems Review of Systems  Constitutional: Negative for activity change and appetite change.  HENT: Negative for congestion, ear discharge, ear pain, facial swelling, nosebleeds, rhinorrhea, sneezing and tinnitus.   Eyes: Negative for photophobia, pain and discharge.  Respiratory: Negative for cough, choking, shortness of breath and wheezing.   Cardiovascular: Negative for chest pain, palpitations and leg swelling.  Gastrointestinal: Negative for abdominal pain, blood in stool, constipation, diarrhea, nausea and vomiting.  Genitourinary: Negative for difficulty urinating, dysuria, flank pain,  frequency and hematuria.  Musculoskeletal: Positive for arthralgias. Negative for back pain, gait problem, myalgias and neck pain.  Skin: Negative for color change, rash and wound.  Neurological: Negative for dizziness, seizures, syncope, facial asymmetry, speech difficulty, weakness and numbness.  Hematological: Negative for adenopathy. Does not bruise/bleed easily.  Psychiatric/Behavioral: Negative for agitation, confusion, hallucinations, self-injury and suicidal ideas. The patient is not nervous/anxious.      Physical Exam Updated Vital Signs BP (!) 146/82 (BP Location: Left Arm)   Pulse 82   Temp 97.8 F (36.6 C) (Oral)   Resp 19   Wt 96.2 kg (212 lb)   SpO2 96%   BMI 34.22 kg/m   Physical Exam  Constitutional: Vital signs are normal. She appears well-developed and well-nourished. She is active.  HENT:  Head: Normocephalic and atraumatic.  Right Ear: Tympanic membrane, external ear and ear canal normal.  Left Ear: Tympanic membrane, external ear and ear canal normal.  Nose: Nose normal.  Mouth/Throat: Uvula is midline, oropharynx is clear and moist and mucous membranes are normal.  Eyes: Conjunctivae, EOM and lids are normal. Pupils are equal, round, and reactive to light.  Neck: Trachea normal, normal range of motion and phonation normal. Neck supple. Carotid bruit is not present.  Cardiovascular: Normal rate, regular rhythm and normal pulses.   Abdominal: Soft. Normal appearance and bowel sounds are normal.  Musculoskeletal: She exhibits tenderness.       Right hip: She exhibits tenderness. She exhibits normal range of motion, normal strength, no bony tenderness and no crepitus.       Left hip: She exhibits tenderness. She exhibits normal range of motion, normal strength, no bony tenderness and no crepitus.  Lymphadenopathy:       Head (right side): No submental, no preauricular and no posterior auricular adenopathy present.       Head (left side): No submental, no  preauricular and no posterior auricular adenopathy present.    She has no cervical adenopathy.  Neurological: She is alert. She has normal strength. No cranial nerve deficit or sensory deficit. GCS eye subscore is 4. GCS verbal subscore is 5. GCS motor subscore is 6.  Skin: Skin is warm and dry.  Psychiatric: Her speech is normal.     ED Treatments / Results  Labs (all labs ordered are listed, but only abnormal results are displayed) Labs Reviewed - No data to display  EKG  EKG Interpretation None       Radiology No results found.  Procedures Procedures (including critical care time)  Medications Ordered in ED Medications - No data to display   Initial Impression / Assessment and Plan / ED Course  I have reviewed the triage vital signs and  the nursing notes.  Pertinent labs & imaging results that were available during my care of the patient were reviewed by me and considered in my medical decision making (see chart for details).       Final Clinical Impressions(s) / ED Diagnoses MDM Vital signs reviewed. The patient gives a history that sounds like a post herpetic neuralgia. Patient will be placed on gabapentin 3 times a day. Patient will also be placed on a short course of steroid, and a dose of anti-inflammatory pain medication. I've asked patient to see Dr. Meda Coffee for follow-up of her right and left hip pain. The patient is in agreement with this plan.    Final diagnoses:  Post herpetic neuralgia  Right hip pain    New Prescriptions New Prescriptions   No medications on file     Annette Stable 10/24/16 2312    Milton Ferguson, MD 10/30/16 1622

## 2016-10-25 ENCOUNTER — Ambulatory Visit: Payer: Medicare Other | Admitting: Family Medicine

## 2016-10-26 ENCOUNTER — Ambulatory Visit: Payer: Medicare Other | Admitting: Family Medicine

## 2016-11-05 ENCOUNTER — Telehealth: Payer: Self-pay | Admitting: Orthopaedic Surgery

## 2016-11-05 MED ORDER — HYDROCODONE-ACETAMINOPHEN 5-325 MG PO TABS: 1.0000 | ORAL_TABLET | Freq: Four times a day (QID) | ORAL | 0 refills | Status: DC | PRN

## 2016-11-05 NOTE — Telephone Encounter (Signed)
Patient requests refill: HYDROcodone-acetaminophen (NORCO/VICODIN) 5-325 MG tablet 55 tablet

## 2016-11-06 ENCOUNTER — Ambulatory Visit: Payer: Medicare Other | Admitting: Family Medicine

## 2016-11-13 ENCOUNTER — Ambulatory Visit (HOSPITAL_COMMUNITY)
Admission: RE | Admit: 2016-11-13 | Discharge: 2016-11-13 | Disposition: A | Discharge status: Home / Self Care | Payer: Medicare Other | Source: Ambulatory Visit | Attending: Family Medicine | Admitting: Family Medicine

## 2016-11-13 ENCOUNTER — Ambulatory Visit (INDEPENDENT_AMBULATORY_CARE_PROVIDER_SITE_OTHER): Payer: Medicare Other | Admitting: Family Medicine

## 2016-11-13 ENCOUNTER — Encounter: Payer: Self-pay | Admitting: Family Medicine

## 2016-11-13 VITALS — BP 148/88 | HR 72 | Temp 97.5°F | Resp 18 | Ht 66.0 in | Wt 218.1 lb

## 2016-11-13 DIAGNOSIS — R739 Hyperglycemia, unspecified: Secondary | ICD-10-CM

## 2016-11-13 DIAGNOSIS — M160 Bilateral primary osteoarthritis of hip: Secondary | ICD-10-CM | POA: Diagnosis not present

## 2016-11-13 DIAGNOSIS — M5136 Other intervertebral disc degeneration, lumbar region: Secondary | ICD-10-CM | POA: Diagnosis not present

## 2016-11-13 DIAGNOSIS — M25551 Pain in right hip: Secondary | ICD-10-CM

## 2016-11-13 DIAGNOSIS — B0229 Other postherpetic nervous system involvement: Secondary | ICD-10-CM | POA: Diagnosis not present

## 2016-11-13 DIAGNOSIS — M1611 Unilateral primary osteoarthritis, right hip: Secondary | ICD-10-CM | POA: Diagnosis not present

## 2016-11-13 HISTORY — DX: Other postherpetic nervous system involvement: B02.29

## 2016-11-13 MED ORDER — DICLOFENAC SODIUM 75 MG PO TBEC: 75.0000 mg | DELAYED_RELEASE_TABLET | Freq: Two times a day (BID) | ORAL | 0 refills | Status: DC

## 2016-11-13 NOTE — Progress Notes (Signed)
Chief Complaint  Patient presents with  . Follow-up  ER follow up discussed visit with her She was diagnosed with hip pain and post herpetic neuralgia The steroid "made my sugar go up" The diclofenac works great - one pill gives her 3 d of pain relief The gabapentin also helps The hip pain is in her R hip with weight bearing, felt in groin and upper thigh The left hip pain is from her post hip/buttock region down back of thigh and is from her shingles or her back pain (sciatica) Otherwise is well  Patient Active Problem List   Diagnosis Date Noted  . Neuralgia, post-herpetic 11/13/2016  . History of colonic polyps 10/05/2016  . HLD (hyperlipidemia) 09/10/2016  . Essential hypertension 09/10/2016  . Primary vitiligo 09/10/2016  . Osteoarthritis of knee 09/10/2016  . Chronic lower back pain 09/10/2016    Outpatient Encounter Prescriptions as of 11/13/2016  Medication Sig  . aspirin 81 MG tablet Take 81 mg by mouth daily.  Marland Kitchen dexamethasone (DECADRON) 4 MG tablet Take 1 tablet (4 mg total) by mouth 2 (two) times daily with a meal.  . diclofenac (VOLTAREN) 75 MG EC tablet Take 1 tablet (75 mg total) by mouth 2 (two) times daily.  . diphenhydrAMINE (BENADRYL) 25 MG tablet Take 1 tablet (25 mg total) by mouth every 6 (six) hours.  . famotidine (PEPCID) 20 MG tablet Take 1 tablet (20 mg total) by mouth 2 (two) times daily.  Marland Kitchen gabapentin (NEURONTIN) 300 MG capsule Take 1 capsule (300 mg total) by mouth 3 (three) times daily.  Marland Kitchen HYDROcodone-acetaminophen (NORCO/VICODIN) 5-325 MG tablet Take 1 tablet by mouth every 6 (six) hours as needed for moderate pain (Must last 30 days.Do not take and drive a car or use machinery.).  Marland Kitchen losartan (COZAAR) 25 MG tablet Take 1 tablet (25 mg total) by mouth daily.  . Multiple Vitamins-Calcium (ONE-A-DAY WOMENS PO) Take 1 tablet by mouth daily.    . pravastatin (PRAVACHOL) 40 MG tablet Take 1 tablet (40 mg total) by mouth daily.   No  facility-administered encounter medications on file as of 11/13/2016.     No Known Allergies  Review of Systems  Constitutional: Negative for activity change, appetite change and unexpected weight change.  HENT: Negative for congestion, dental problem, postnasal drip and rhinorrhea.   Eyes: Negative for redness and visual disturbance.  Respiratory: Negative for cough and shortness of breath.   Cardiovascular: Negative for chest pain, palpitations and leg swelling.  Gastrointestinal: Negative for abdominal pain, constipation and diarrhea.  Genitourinary: Negative for difficulty urinating and frequency.  Musculoskeletal: Positive for arthralgias, back pain and gait problem. Negative for joint swelling.  Skin: Negative for rash.  Neurological: Negative for dizziness and headaches.  Psychiatric/Behavioral: Negative for dysphoric mood and sleep disturbance. The patient is not nervous/anxious.     BP (!) 148/88 (BP Location: Right Arm, Patient Position: Sitting, Cuff Size: Normal)   Pulse 72   Temp 97.5 F (36.4 C) (Temporal)   Resp 18   Ht 5\' 6"  (1.676 m)   Wt 218 lb 1.9 oz (98.9 kg)   SpO2 99%   BMI 35.21 kg/m   Physical Exam  Constitutional: She is oriented to person, place, and time. She appears well-developed and well-nourished. No distress.  Antalgic gait  HENT:  Head: Normocephalic and atraumatic.  Mouth/Throat: Oropharynx is clear and moist.  Musculoskeletal: Normal range of motion. She exhibits no edema.  Pain on external rotation L hip Right hip nontender  with FROM No back tenderness No rash   Neurological: She is alert and oriented to person, place, and time. She displays normal reflexes. Coordination normal.  Psychiatric: She has a normal mood and affect. Her behavior is normal.    ASSESSMENT/PLAN:  1. Hip pain, acute, right  - DG HIP UNILAT WITH PELVIS 2-3 VIEWS RIGHT; Future  2. Neuralgia, post-herpetic Vs sciatica  Patient Instructions  Take diclofenac  for arthritis pain Take with food  Take gabapentin for nerve or shingles pain Call if you need refills  Go for hip x ray I will send you a letter with your test results.      Raylene Everts, MD

## 2016-11-13 NOTE — Patient Instructions (Signed)
Take diclofenac for arthritis pain Take with food  Take gabapentin for nerve or shingles pain Call if you need refills  Go for hip x ray I will send you a letter with your test results.

## 2016-11-22 ENCOUNTER — Telehealth: Payer: Self-pay | Admitting: Orthopaedic Surgery

## 2016-11-23 ENCOUNTER — Other Ambulatory Visit: Payer: Self-pay

## 2016-11-23 ENCOUNTER — Telehealth: Payer: Self-pay | Admitting: *Deleted

## 2016-11-23 MED ORDER — DICLOFENAC SODIUM 75 MG PO TBEC: 75.0000 mg | DELAYED_RELEASE_TABLET | Freq: Two times a day (BID) | ORAL | 0 refills | Status: DC

## 2016-11-23 NOTE — Telephone Encounter (Signed)
Seen [phone] 

## 2016-11-23 NOTE — Telephone Encounter (Signed)
Patient called requesting a refill on dicoldenac. Please advise [phone]

## 2016-11-26 ENCOUNTER — Telehealth: Payer: Self-pay | Admitting: Family Medicine

## 2016-11-26 ENCOUNTER — Other Ambulatory Visit: Payer: Self-pay

## 2016-11-26 MED ORDER — DICLOFENAC SODIUM 75 MG PO TBEC: 75.0000 mg | DELAYED_RELEASE_TABLET | Freq: Two times a day (BID) | ORAL | 0 refills | Status: DC

## 2016-11-26 MED ORDER — DICLOFENAC SODIUM 75 MG PO TBEC: 75.0000 mg | DELAYED_RELEASE_TABLET | Freq: Two times a day (BID) | ORAL | 1 refills | Status: DC

## 2016-11-26 NOTE — Telephone Encounter (Signed)
Pt voiced needing prescription on diclofenac (VOLTAREN) 75 MG EC tablet

## 2016-12-21 ENCOUNTER — Telehealth (INDEPENDENT_AMBULATORY_CARE_PROVIDER_SITE_OTHER): Payer: Self-pay | Admitting: *Deleted

## 2016-12-21 ENCOUNTER — Encounter (INDEPENDENT_AMBULATORY_CARE_PROVIDER_SITE_OTHER): Payer: Self-pay | Admitting: *Deleted

## 2016-12-21 NOTE — Telephone Encounter (Signed)
Patient need trilyte 

## 2016-12-24 MED ORDER — PEG 3350-KCL-NA BICARB-NACL 420 G PO SOLR: 4000.0000 mL | Freq: Once | ORAL | 0 refills | Status: AC

## 2017-01-03 ENCOUNTER — Encounter: Payer: Self-pay | Admitting: Orthopaedic Surgery

## 2017-01-03 ENCOUNTER — Ambulatory Visit (INDEPENDENT_AMBULATORY_CARE_PROVIDER_SITE_OTHER): Payer: Medicare Other | Admitting: Orthopaedic Surgery

## 2017-01-03 VITALS — BP 168/89 | HR 69 | Temp 97.8°F | Ht 66.0 in | Wt 218.0 lb

## 2017-01-03 DIAGNOSIS — M25551 Pain in right hip: Secondary | ICD-10-CM | POA: Diagnosis not present

## 2017-01-03 DIAGNOSIS — M25552 Pain in left hip: Secondary | ICD-10-CM | POA: Diagnosis not present

## 2017-01-03 MED ORDER — NAPROXEN 500 MG PO TABS: 500.0000 mg | ORAL_TABLET | Freq: Two times a day (BID) | ORAL | 5 refills | Status: DC

## 2017-01-03 NOTE — Addendum Note (Signed)
Addended by: Baldomero Lamy B on: 01/03/2017 02:01 PM   Modules accepted: Orders

## 2017-01-03 NOTE — Progress Notes (Signed)
Patient Jeanette Compton, female DOB:10/09/1955, 61 y.o. WPY:[id number]  Chief Complaint  Patient presents with  . Follow-up    left knee    HPI  Jeanette Compton is a 61 y.o. female who has developed pain in both hips.  She saw Dr. Meda Coffee and x-rays were done showing degenerative changes of both hips, more on right.  She has pain with prolonged standing or walking and after climbing multiple steps.  She has no trauma, no numbness.  She is not taking any NSAID.  She does not think arthritis runs in her family.  Her knee pain on the left is better. HPI  Body mass index is 35.19 kg/m.  ROS  Review of Systems  HENT: Negative for congestion.   Respiratory: Negative for cough and shortness of breath.   Cardiovascular: Negative for chest pain and leg swelling.  Endocrine: Positive for cold intolerance.  Musculoskeletal: Positive for arthralgias, gait problem and joint swelling.  Allergic/Immunologic: Positive for environmental allergies.  Neurological: Negative for numbness.  All other systems reviewed and are negative.   Past Medical History:  Diagnosis Date  . Arthritis    knee , ankle and back  . Bronchitis   . Cataract   . Hyperlipidemia   . Hypertension   . Neuralgia, post-herpetic 11/13/2016  . Thyroid disease     Past Surgical History:  Procedure Laterality Date  . ABDOMINAL HYSTERECTOMY     fibroid  . BREAST SURGERY    . COLONOSCOPY  09/13/2011   Procedure: COLONOSCOPY;  Surgeon: Rogene Houston, MD;  Location: [facility];  Service: Endoscopy;  Laterality: N/A;  1030    Family History  Problem Relation Age of Onset  . Stroke Mother   . Diabetes Mother   . Alcohol abuse Father   . Early death Father   . Early death Sister        pneumonia  . Cancer Brother   . Alzheimer's disease Sister   . COPD Brother   . Colon cancer Neg Hx     Social History Social History  Substance Use Topics  . Smoking status: Former Smoker    Packs/day: 0.50    Years: 3.00   . Smokeless tobacco: Never Used  . Alcohol use No    No Known Allergies  Current Outpatient Prescriptions  Medication Sig Dispense Refill  . aspirin 81 MG tablet Take 81 mg by mouth daily.    Marland Kitchen dexamethasone (DECADRON) 4 MG tablet Take 1 tablet (4 mg total) by mouth 2 (two) times daily with a meal. 12 tablet 0  . diclofenac (VOLTAREN) 75 MG EC tablet Take 1 tablet (75 mg total) by mouth 2 (two) times daily. 180 tablet 1  . diphenhydrAMINE (BENADRYL) 25 MG tablet Take 1 tablet (25 mg total) by mouth every 6 (six) hours. 20 tablet 0  . famotidine (PEPCID) 20 MG tablet Take 1 tablet (20 mg total) by mouth 2 (two) times daily. 30 tablet 0  . gabapentin (NEURONTIN) 300 MG capsule Take 1 capsule (300 mg total) by mouth 3 (three) times daily. 45 capsule 0  . HYDROcodone-acetaminophen (NORCO/VICODIN) 5-325 MG tablet TAKE (1) TABLET BY MOUTH EVERY 6 HOURS AS NEEDED FOR MODERATE PAIN. MUST LAST 30 DAYS. 40 tablet 0  . losartan (COZAAR) 25 MG tablet Take 1 tablet (25 mg total) by mouth daily. 30 tablet 11  . Multiple Vitamins-Calcium (ONE-A-DAY WOMENS PO) Take 1 tablet by mouth daily.      . naproxen (NAPROSYN) 500  MG tablet Take 1 tablet (500 mg total) by mouth 2 (two) times daily with a meal. 60 tablet 5  . pravastatin (PRAVACHOL) 40 MG tablet Take 1 tablet (40 mg total) by mouth daily. 30 tablet 11   No current facility-administered medications for this visit.      Physical Exam  Blood pressure (!) 168/89, pulse 69, temperature 97.8 F (36.6 C), height 5\' 6"  (1.676 m), weight 218 lb (98.9 kg).  Constitutional: overall normal hygiene, normal nutrition, well developed, normal grooming, normal body habitus. Assistive device:none  Musculoskeletal: gait and station Limp none, muscle tone and strength are normal, no tremors or atrophy is present.  .  Neurological: coordination overall normal.  Deep tendon reflex/nerve stretch intact.  Sensation normal.  Cranial nerves II-XII intact.   Skin:    Normal overall no scars, lesions, ulcers or rashes. No psoriasis.  Psychiatric: Alert and oriented x 3.  Recent memory intact, remote memory unclear.  Normal mood and affect. Well groomed.  Good eye contact.  Cardiovascular: overall no swelling, no varicosities, no edema bilaterally, normal temperatures of the legs and arms, no clubbing, cyanosis and good capillary refill.  Lymphatic: palpation is normal.  Both hips have some tenderness but full ROM is present.  NV intact. Gait is good.    The patient has been educated about the nature of the problem(s) and counseled on treatment options.  The patient appeared to understand what I have discussed and is in agreement with it.  Encounter Diagnoses  Name Primary?  . Right hip pain Yes  . Left hip pain     PLAN Call if any problems.  Precautions discussed.  Continue current medications.   Return to clinic 1 month   Begin Naprosyn.  Precautions given.  Electronically Signed Sanjuana Kava, MD 8/16/20189:17 AM

## 2017-01-08 ENCOUNTER — Telehealth (INDEPENDENT_AMBULATORY_CARE_PROVIDER_SITE_OTHER): Payer: Self-pay | Admitting: *Deleted

## 2017-01-08 NOTE — Telephone Encounter (Signed)
agree

## 2017-01-08 NOTE — Telephone Encounter (Signed)
Referring MD/PCP: nelson   Procedure: tcs  Reason/Indication:  Hx polyps  Has patient had this procedure before?  Yes, 2013  If so, when, by whom and where?    Is there a family history of colon cancer?  no  Who?  What age when diagnosed?    Is patient diabetic?   no      Does patient have prosthetic heart valve or mechanical valve?  no  Do you have a pacemaker?  no  Has patient ever had endocarditis? no  Has patient had joint replacement within last 12 months?  no  Does patient tend to be constipated or take laxatives? no  Does patient have a history of alcohol/drug use?  no  Is patient on Coumadin, Plavix and/or Aspirin? yes  Medications: see epic  Allergies: nkda  Medication Adjustment per Dr Laural Golden: asa 2 days  Procedure date & time: 02/06/17 at 33

## 2017-01-31 ENCOUNTER — Encounter: Payer: Self-pay | Admitting: Orthopaedic Surgery

## 2017-01-31 ENCOUNTER — Ambulatory Visit (INDEPENDENT_AMBULATORY_CARE_PROVIDER_SITE_OTHER): Payer: Medicare Other | Admitting: Orthopaedic Surgery

## 2017-01-31 VITALS — BP 184/92 | HR 69 | Temp 97.9°F | Ht 66.0 in | Wt 214.0 lb

## 2017-01-31 DIAGNOSIS — M25552 Pain in left hip: Secondary | ICD-10-CM | POA: Diagnosis not present

## 2017-01-31 DIAGNOSIS — M25551 Pain in right hip: Secondary | ICD-10-CM

## 2017-01-31 MED ORDER — HYDROCODONE-ACETAMINOPHEN 5-325 MG PO TABS: ORAL_TABLET | ORAL | 0 refills | Status: DC

## 2017-01-31 MED ORDER — NABUMETONE 750 MG PO TABS: 750.0000 mg | ORAL_TABLET | Freq: Two times a day (BID) | ORAL | 5 refills | Status: DC

## 2017-01-31 NOTE — Progress Notes (Signed)
Patient NF:AOZHYQ Jeanette Compton, female DOB:[birth year], 61 y.o. MVH:[id number]  Chief Complaint  Patient presents with  . Follow-up    Hip pain    HPI  Jeanette Compton is a 61 y.o. female who has pain of both hips, more on the right.  She could not tolerate the Naprosyn as she broke out.  She has been on diclofenac in the past and it did not help either.She has no new trauma.  She has no numbness. HPI  Body mass index is 34.54 kg/m.  ROS  Review of Systems  HENT: Negative for congestion.   Respiratory: Negative for cough and shortness of breath.   Cardiovascular: Negative for chest pain and leg swelling.  Endocrine: Positive for cold intolerance.  Musculoskeletal: Positive for arthralgias, gait problem and joint swelling.  Allergic/Immunologic: Positive for environmental allergies.  Neurological: Negative for numbness.  All other systems reviewed and are negative.   Past Medical History:  Diagnosis Date  . Arthritis    knee , ankle and back  . Bronchitis   . Cataract   . Hyperlipidemia   . Hypertension   . Neuralgia, post-herpetic 11/13/2016  . Thyroid disease     Past Surgical History:  Procedure Laterality Date  . ABDOMINAL HYSTERECTOMY     fibroid  . BREAST SURGERY    . COLONOSCOPY  09/13/2011   Procedure: COLONOSCOPY;  Surgeon: Rogene Houston, MD;  Location: [facility];  Service: Endoscopy;  Laterality: N/A;  1030    Family History  Problem Relation Age of Onset  . Stroke Mother   . Diabetes Mother   . Alcohol abuse Father   . Early death Father   . Early death Sister        pneumonia  . Cancer Brother   . Alzheimer's disease Sister   . COPD Brother   . Colon cancer Neg Hx     Social History Social History  Substance Use Topics  . Smoking status: Former Smoker    Packs/day: 0.50    Years: 3.00  . Smokeless tobacco: Never Used  . Alcohol use No    Allergies  Allergen Reactions  . Naproxen Itching and Other (See Comments)    "hurts my stomach"     Current Outpatient Prescriptions  Medication Sig Dispense Refill  . aspirin 81 MG tablet Take 81 mg by mouth daily.    Marland Kitchen dexamethasone (DECADRON) 4 MG tablet Take 1 tablet (4 mg total) by mouth 2 (two) times daily with a meal. 12 tablet 0  . diphenhydrAMINE (BENADRYL) 25 MG tablet Take 1 tablet (25 mg total) by mouth every 6 (six) hours. 20 tablet 0  . famotidine (PEPCID) 20 MG tablet Take 1 tablet (20 mg total) by mouth 2 (two) times daily. 30 tablet 0  . gabapentin (NEURONTIN) 300 MG capsule Take 1 capsule (300 mg total) by mouth 3 (three) times daily. 45 capsule 0  . HYDROcodone-acetaminophen (NORCO/VICODIN) 5-325 MG tablet TAKE (1) TABLET BY MOUTH EVERY 6 HOURS AS NEEDED FOR MODERATE PAIN. MUST LAST 30 DAYS. 40 tablet 0  . losartan (COZAAR) 25 MG tablet Take 1 tablet (25 mg total) by mouth daily. 30 tablet 11  . Multiple Vitamins-Calcium (ONE-A-DAY WOMENS PO) Take 1 tablet by mouth daily.      . nabumetone (RELAFEN) 750 MG tablet Take 1 tablet (750 mg total) by mouth 2 (two) times daily. One by mouth twice a day after eating. 60 tablet 5  . pravastatin (PRAVACHOL) 40 MG tablet Take  1 tablet (40 mg total) by mouth daily. 30 tablet 11   No current facility-administered medications for this visit.      Physical Exam  Blood pressure (!) 184/92, pulse 69, temperature 97.9 F (36.6 C), height 5\' 6"  (1.676 m), weight 214 lb (97.1 kg).  Constitutional: overall normal hygiene, normal nutrition, well developed, normal grooming, normal body habitus. Assistive device:none  Musculoskeletal: gait and station Limp none, muscle tone and strength are normal, no tremors or atrophy is present.  .  Neurological: coordination overall normal.  Deep tendon reflex/nerve stretch intact.  Sensation normal.  Cranial nerves II-XII intact.   Skin:   Normal overall no scars, lesions, ulcers or rashes. No psoriasis.  Psychiatric: Alert and oriented x 3.  Recent memory intact, remote memory unclear.  Normal  mood and affect. Well groomed.  Good eye contact.  Cardiovascular: overall no swelling, no varicosities, no edema bilaterally, normal temperatures of the legs and arms, no clubbing, cyanosis and good capillary refill.  Lymphatic: palpation is normal.  All other systems reviewed and are negative   Both hips have some pain and motion is good.  Gait is good.  NV is intact.  The patient has been educated about the nature of the problem(s) and counseled on treatment options.  The patient appeared to understand what I have discussed and is in agreement with it.  Encounter Diagnoses  Name Primary?  . Right hip pain Yes  . Left hip pain     PLAN Call if any problems.  Precautions discussed.  Continue current medications.   Return to clinic 1 month   I have reviewed the [place] web site prior to prescribing narcotic medicine for this patient.  Electronically Signed Sanjuana Kava, MD 9/13/20189:09 AM

## 2017-02-28 ENCOUNTER — Encounter: Payer: Self-pay | Admitting: Orthopaedic Surgery

## 2017-02-28 ENCOUNTER — Ambulatory Visit (INDEPENDENT_AMBULATORY_CARE_PROVIDER_SITE_OTHER): Payer: Medicare Other | Admitting: Orthopaedic Surgery

## 2017-02-28 VITALS — BP 173/83 | HR 81 | Temp 97.4°F | Ht 66.0 in | Wt 214.0 lb

## 2017-02-28 DIAGNOSIS — M25551 Pain in right hip: Secondary | ICD-10-CM

## 2017-02-28 MED ORDER — HYDROCODONE-ACETAMINOPHEN 5-325 MG PO TABS: ORAL_TABLET | ORAL | 0 refills | Status: DC

## 2017-02-28 NOTE — Progress Notes (Signed)
Patient BO:[id number] Jeanette Compton, female DOB:05-25-1955, 61 y.o. HEN:[id number]  Chief Complaint  Patient presents with  . Follow-up    hip pain    HPI  Jeanette Compton is a 61 y.o. female who has bilateral hip pain, more on the right.  It is more over the trochanteric area.  She has no trauma, no redness.  She does not want an injection. She is taking the Relafen which helps. HPI  Body mass index is 34.54 kg/m.  ROS  Review of Systems  HENT: Negative for congestion.   Respiratory: Negative for cough and shortness of breath.   Cardiovascular: Negative for chest pain and leg swelling.  Endocrine: Positive for cold intolerance.  Musculoskeletal: Positive for arthralgias, gait problem and joint swelling.  Allergic/Immunologic: Positive for environmental allergies.  Neurological: Negative for numbness.  All other systems reviewed and are negative.   Past Medical History:  Diagnosis Date  . Arthritis    knee , ankle and back  . Bronchitis   . Cataract   . Hyperlipidemia   . Hypertension   . Neuralgia, post-herpetic 11/13/2016  . Thyroid disease     Past Surgical History:  Procedure Laterality Date  . ABDOMINAL HYSTERECTOMY     fibroid  . BREAST SURGERY    . COLONOSCOPY  09/13/2011   Procedure: COLONOSCOPY;  Surgeon: Rogene Houston, MD;  Location: [facility];  Service: Endoscopy;  Laterality: N/A;  1030    Family History  Problem Relation Age of Onset  . Stroke Mother   . Diabetes Mother   . Alcohol abuse Father   . Early death Father   . Early death Sister        pneumonia  . Cancer Brother   . Alzheimer's disease Sister   . COPD Brother   . Colon cancer Neg Hx     Social History Social History  Substance Use Topics  . Smoking status: Former Smoker    Packs/day: 0.50    Years: 3.00  . Smokeless tobacco: Never Used  . Alcohol use No    Allergies  Allergen Reactions  . Naproxen Itching and Other (See Comments)    "hurts my stomach"    Current  Outpatient Prescriptions  Medication Sig Dispense Refill  . aspirin EC 81 MG tablet Take 81 mg by mouth at bedtime.    Marland Kitchen dexamethasone (DECADRON) 4 MG tablet Take 1 tablet (4 mg total) by mouth 2 (two) times daily with a meal. (Patient not taking: Reported on 02/01/2017) 12 tablet 0  . diphenhydrAMINE (BENADRYL) 25 MG tablet Take 1 tablet (25 mg total) by mouth every 6 (six) hours. (Patient not taking: Reported on 02/01/2017) 20 tablet 0  . famotidine (PEPCID) 20 MG tablet Take 1 tablet (20 mg total) by mouth 2 (two) times daily. (Patient not taking: Reported on 02/01/2017) 30 tablet 0  . gabapentin (NEURONTIN) 300 MG capsule Take 1 capsule (300 mg total) by mouth 3 (three) times daily. (Patient not taking: Reported on 02/01/2017) 45 capsule 0  . HYDROcodone-acetaminophen (NORCO/VICODIN) 5-325 MG tablet TAKE (1) TABLET BY MOUTH EVERY 6 HOURS AS NEEDED FOR MODERATE PAIN. MUST LAST 30 DAYS. 50 tablet 0  . losartan (COZAAR) 25 MG tablet Take 1 tablet (25 mg total) by mouth daily. 30 tablet 11  . Multiple Vitamin (MULTIVITAMIN WITH MINERALS) TABS tablet Take 1 tablet by mouth daily. One-A-Day    . nabumetone (RELAFEN) 750 MG tablet Take 1 tablet (750 mg total) by mouth 2 (two) times  daily. One by mouth twice a day after eating. (Patient not taking: Reported on 02/01/2017) 60 tablet 5  . pravastatin (PRAVACHOL) 40 MG tablet Take 1 tablet (40 mg total) by mouth daily. (Patient taking differently: Take 40 mg by mouth at bedtime. ) 30 tablet 11   No current facility-administered medications for this visit.      Physical Exam  Blood pressure (!) 173/83, pulse 81, temperature (!) 97.4 F (36.3 C), height 5\' 6"  (1.676 m), weight 214 lb (97.1 kg).  Constitutional: overall normal hygiene, normal nutrition, well developed, normal grooming, normal body habitus. Assistive device:none  Musculoskeletal: gait and station Limp right, muscle tone and strength are normal, no tremors or atrophy is present.  .   Neurological: coordination overall normal.  Deep tendon reflex/nerve stretch intact.  Sensation normal.  Cranial nerves II-XII intact.   Skin:   Normal overall no scars, lesions, ulcers or rashes. No psoriasis.  Psychiatric: Alert and oriented x 3.  Recent memory intact, remote memory unclear.  Normal mood and affect. Well groomed.  Good eye contact.  Cardiovascular: overall no swelling, no varicosities, no edema bilaterally, normal temperatures of the legs and arms, no clubbing, cyanosis and good capillary refill.  Lymphatic: palpation is normal.  All other systems reviewed and are negative   Right hip has tenderness over the lateral trochanteric area.  NV intact.  ROM is full.  Limp to the right slightly.    The patient has been educated about the nature of the problem(s) and counseled on treatment options.  The patient appeared to understand what I have discussed and is in agreement with it.  Encounter Diagnosis  Name Primary?  . Right hip pain Yes    PLAN Call if any problems.  Precautions discussed.  Continue current medications.   Return to clinic 6 weeks   I have reviewed the [place] web site prior to prescribing narcotic medicine for this patient.  Electronically Signed Sanjuana Kava, MD 10/11/20188:33 AM

## 2017-03-12 ENCOUNTER — Encounter: Payer: Self-pay | Admitting: Family Medicine

## 2017-03-12 ENCOUNTER — Ambulatory Visit (INDEPENDENT_AMBULATORY_CARE_PROVIDER_SITE_OTHER): Payer: Medicare Other | Admitting: Family Medicine

## 2017-03-12 VITALS — BP 182/88 | HR 72 | Temp 97.4°F | Resp 18 | Ht 66.0 in | Wt 213.0 lb

## 2017-03-12 DIAGNOSIS — R739 Hyperglycemia, unspecified: Secondary | ICD-10-CM | POA: Diagnosis not present

## 2017-03-12 DIAGNOSIS — I10 Essential (primary) hypertension: Secondary | ICD-10-CM | POA: Diagnosis not present

## 2017-03-12 DIAGNOSIS — Z23 Encounter for immunization: Secondary | ICD-10-CM

## 2017-03-12 DIAGNOSIS — E785 Hyperlipidemia, unspecified: Secondary | ICD-10-CM | POA: Diagnosis not present

## 2017-03-12 MED ORDER — LOSARTAN POTASSIUM-HCTZ 50-12.5 MG PO TABS: 1.0000 | ORAL_TABLET | Freq: Every day | ORAL | 3 refills | Status: DC

## 2017-03-12 NOTE — Patient Instructions (Signed)
I have changed the BP medicine It is losartan with HCTZ Take one a day just like the other  See me in 6 months Need blood work and a PE next time

## 2017-03-12 NOTE — Progress Notes (Signed)
Chief Complaint  Patient presents with  . Follow-up    6 month   Routine visit BP not well controlled Will add HCTZ to the cozaar and see if this helps She is asked to come back in 2 weeks for a BP check Feels well Up to date with testing   Patient Active Problem List   Diagnosis Date Noted  . Neuralgia, post-herpetic 11/13/2016  . Elevated blood sugar 11/13/2016  . History of colonic polyps 10/05/2016  . HLD (hyperlipidemia) 09/10/2016  . Essential hypertension 09/10/2016  . Primary vitiligo 09/10/2016  . Osteoarthritis of knee 09/10/2016  . Chronic lower back pain 09/10/2016    Outpatient Encounter Prescriptions as of 03/12/2017  Medication Sig  . aspirin EC 81 MG tablet Take 81 mg by mouth at bedtime.  Marland Kitchen HYDROcodone-acetaminophen (NORCO/VICODIN) 5-325 MG tablet TAKE (1) TABLET BY MOUTH EVERY 6 HOURS AS NEEDED FOR MODERATE PAIN. MUST LAST 30 DAYS.  Marland Kitchen Multiple Vitamin (MULTIVITAMIN WITH MINERALS) TABS tablet Take 1 tablet by mouth daily. One-A-Day  . nabumetone (RELAFEN) 750 MG tablet Take 1 tablet (750 mg total) by mouth 2 (two) times daily. One by mouth twice a day after eating.  . pravastatin (PRAVACHOL) 40 MG tablet Take 1 tablet (40 mg total) by mouth daily. (Patient taking differently: Take 40 mg by mouth at bedtime. )  . losartan-hydrochlorothiazide (HYZAAR) 50-12.5 MG tablet Take 1 tablet by mouth daily.   No facility-administered encounter medications on file as of 03/12/2017.     Allergies  Allergen Reactions  . Lisinopril Swelling    Lips swelled  . Naproxen Itching and Other (See Comments)    "hurts my stomach"    Review of Systems  Constitutional: Negative for activity change, appetite change and unexpected weight change.  HENT: Negative for congestion, dental problem, postnasal drip and rhinorrhea.   Eyes: Negative for redness and visual disturbance.  Respiratory: Negative for cough and shortness of breath.   Cardiovascular: Negative for chest  pain, palpitations and leg swelling.  Gastrointestinal: Negative for abdominal pain, constipation and diarrhea.  Genitourinary: Negative for difficulty urinating and frequency.  Musculoskeletal: Positive for arthralgias, back pain and gait problem. Negative for joint swelling.       Chronic  Skin: Negative for rash.  Neurological: Negative for dizziness and headaches.  Psychiatric/Behavioral: Negative for dysphoric mood and sleep disturbance. The patient is not nervous/anxious.     BP (!) 182/88 (BP Location: Right Arm, Patient Position: Sitting, Cuff Size: Normal)   Pulse 72   Temp (!) 97.4 F (36.3 C) (Temporal)   Resp 18   Ht 5\' 6"  (1.676 m)   Wt 213 lb 0.6 oz (96.6 kg)   SpO2 99%   BMI 34.39 kg/m   Physical Exam  Constitutional: She is oriented to person, place, and time. She appears well-developed and well-nourished.  HENT:  Head: Normocephalic and atraumatic.  Mouth/Throat: Oropharynx is clear and moist.  Eyes: Pupils are equal, round, and reactive to light. Conjunctivae are normal.  Neck: Normal range of motion. Neck supple. No thyromegaly present.  Cardiovascular: Normal rate, regular rhythm and normal heart sounds.   Pulmonary/Chest: Effort normal and breath sounds normal. No respiratory distress.  Abdominal: Soft. Bowel sounds are normal.  Musculoskeletal: Normal range of motion. She exhibits no edema.  Lymphadenopathy:    She has no cervical adenopathy.  Neurological: She is alert and oriented to person, place, and time.  Mild limp  Skin: Skin is warm and dry.  Psychiatric: She  has a normal mood and affect. Her behavior is normal. Thought content normal.  Nursing note and vitals reviewed.   ASSESSMENT/PLAN:  1. Elevated blood sugar By history   2. Needs flu shot - Flu Vaccine QUAD 36+ mos IM 3. Ess hypertension Not controlled Will add HCTZ Labs and PE next time Patient Instructions  I have changed the BP medicine It is losartan with HCTZ Take one a  day just like the other  See me in 6 months Need blood work and a PE next time   Jeanette Everts, MD

## 2017-03-27 ENCOUNTER — Other Ambulatory Visit: Payer: Self-pay

## 2017-03-27 NOTE — Patient Outreach (Signed)
[facility]) Care Management  03/27/2017  Jeanette Compton 03/13/1956 [id number]    Medication Adherence call to [name] patient is showing past due under [facility].on Pravastatin 40 mg spoke to patient she ask if we can call [facility] to fill her prescription she said she still has medication but she was going to need it soon,call pharmacy they will have it ready and will fill the prescription for a ninety days supply.call [name] back to let her know her prescription will be ready for her.   [facility] Management Direct Dial [phone]  Fax [phone] [email or web address]

## 2017-04-02 ENCOUNTER — Telehealth: Payer: Self-pay | Admitting: Orthopaedic Surgery

## 2017-04-02 MED ORDER — HYDROCODONE-ACETAMINOPHEN 5-325 MG PO TABS: ORAL_TABLET | ORAL | 0 refills | Status: DC

## 2017-04-02 NOTE — Telephone Encounter (Signed)
Hydrocodone-Acetaminophen  5/325mg  Qty 50 Tablets °

## 2017-04-10 ENCOUNTER — Ambulatory Visit (INDEPENDENT_AMBULATORY_CARE_PROVIDER_SITE_OTHER): Payer: Medicare Other

## 2017-04-10 VITALS — BP 148/78 | HR 97 | Temp 98.3°F | Resp 16 | Ht 66.0 in | Wt 216.0 lb

## 2017-04-10 DIAGNOSIS — Z0000 Encounter for general adult medical examination without abnormal findings: Secondary | ICD-10-CM

## 2017-04-10 NOTE — Progress Notes (Addendum)
Subjective:   Jeanette Compton is a 61 y.o. female who presents for an Initial Medicare Annual Wellness Visit.  Review of Systems      Cardiac Risk Factors include: none     Objective:    Today's Vitals   04/10/17 1013  BP: (!) 148/78  Pulse: 97  Resp: 16  Temp: 98.3 F (36.8 C)  TempSrc: Other (Comment)  SpO2: 97%  Weight: 216 lb (98 kg)  Height: 5\' 6"  (1.676 m)  PainSc: 0-No pain   Body mass index is 34.86 kg/m.   Current Medications (verified) Outpatient Encounter Medications as of 04/10/2017  Medication Sig  . aspirin EC 81 MG tablet Take 81 mg by mouth at bedtime.  Marland Kitchen HYDROcodone-acetaminophen (NORCO/VICODIN) 5-325 MG tablet TAKE (1) TABLET BY MOUTH EVERY 6 HOURS AS NEEDED FOR MODERATE PAIN. MUST LAST 30 DAYS.  Marland Kitchen losartan-hydrochlorothiazide (HYZAAR) 50-12.5 MG tablet Take 1 tablet by mouth daily.  . Multiple Vitamin (MULTIVITAMIN WITH MINERALS) TABS tablet Take 1 tablet by mouth daily. One-A-Day  . nabumetone (RELAFEN) 750 MG tablet Take 1 tablet (750 mg total) by mouth 2 (two) times daily. One by mouth twice a day after eating.  . pravastatin (PRAVACHOL) 40 MG tablet Take 1 tablet (40 mg total) by mouth daily. (Patient taking differently: Take 40 mg by mouth at bedtime. )   No facility-administered encounter medications on file as of 04/10/2017.     Allergies (verified) Lisinopril and Naproxen   History: Past Medical History:  Diagnosis Date  . Arthritis    knee , ankle and back  . Bronchitis   . Cataract   . Hyperlipidemia   . Hypertension   . Neuralgia, post-herpetic 11/13/2016  . Thyroid disease    Past Surgical History:  Procedure Laterality Date  . ABDOMINAL HYSTERECTOMY     fibroid  . BREAST SURGERY    . COLONOSCOPY  09/13/2011   Procedure: COLONOSCOPY;  Surgeon: Rogene Houston, MD;  [facility];  Service: Endoscopy;  Laterality: N/A;  1030   Family History  Problem Relation Age of Onset  . Stroke Mother   . Diabetes Mother     . Alcohol abuse Father   . Early death Father   . Early death Sister        pneumonia  . Cancer Brother   . Alzheimer's disease Sister   . COPD Brother   . Colon cancer Neg Hx    Social History   Occupational History  . Occupation: disability  Tobacco Use  . Smoking status: Former Smoker    Packs/day: 0.50    Years: 3.00    Pack years: 1.50  . Smokeless tobacco: Never Used  Substance and Sexual Activity  . Alcohol use: No  . Drug use: No  . Sexual activity: No    Birth control/protection: Surgical    Tobacco Counseling Counseling given: Not Answered   Activities of Daily Living In your present state of health, do you have any difficulty performing the following activities: 04/10/2017 03/12/2017  Hearing? N N  Vision? N N  Difficulty concentrating or making decisions? N N  Walking or climbing stairs? N N  Dressing or bathing? N N  Doing errands, shopping? N N  Preparing Food and eating ? N -  Using the Toilet? N -  In the past six months, have you accidently leaked urine? N -  Do you have problems with loss of bowel control? N -  Managing your Medications? N -  Managing your Finances? N -  Housekeeping or managing your Housekeeping? N -  Some recent data might be hidden    Immunizations and Health Maintenance Immunization History  Administered Date(s) Administered  . Influenza,inj,Quad PF,6+ Mos 03/12/2017   Health Maintenance Due  Topic Date Due  . Hepatitis C Screening  May 24, 1955  . PNEUMOCOCCAL POLYSACCHARIDE VACCINE (1) 11/16/1958  . FOOT EXAM  11/16/1966  . OPHTHALMOLOGY EXAM  11/16/1966  . HIV Screening  11/16/1971  . TETANUS/TDAP  11/16/1975  . HEMOGLOBIN A1C  07/15/2014    Patient Care Team: Raylene Everts, MD as PCP - General (Family Medicine)  Indicate any recent Medical Services you may have received from other than [facility] providers in the past year (date may be approximate).     Assessment:   This is a routine wellness examination  for [name].   Hearing/Vision screen No exam data present  Dietary issues and exercise activities discussed: Current Exercise Habits: Home exercise routine, Type of exercise: walking;treadmill, Time (Minutes): 60, Frequency (Times/Week): 3, Weekly Exercise (Minutes/Week): 180, Intensity: Moderate, Exercise limited by: None identified  Goals    . Blood Pressure < 140/90    . HEMOGLOBIN A1C < 7.0      Depression Screen PHQ 2/9 Scores 04/10/2017 03/12/2017 11/13/2016 09/10/2016 03/15/2014  PHQ - 2 Score 0 0 0 0 0    Fall Risk Fall Risk  04/10/2017 11/13/2016 09/10/2016 03/15/2014  Falls in the past year? No No No No    Cognitive Function:     6CIT Screen 04/10/2017  What Year? 0 points  What month? 0 points  What time? 0 points  Count back from 20 0 points    Screening Tests Health Maintenance  Topic Date Due  . Hepatitis C Screening  [birth year]  . PNEUMOCOCCAL POLYSACCHARIDE VACCINE (1) 11/16/1958  . FOOT EXAM  11/16/1966  . OPHTHALMOLOGY EXAM  11/16/1966  . HIV Screening  11/16/1971  . TETANUS/TDAP  11/16/1975  . HEMOGLOBIN A1C  07/15/2014  . PAP SMEAR  08/08/2017  . MAMMOGRAM  05/28/2018  . COLONOSCOPY  09/12/2021  . INFLUENZA VACCINE  Completed      Plan:     I have personally reviewed and noted the following in the patient's chart:   . Medical and social history . Use of alcohol, tobacco or illicit drugs  . Current medications and supplements . Functional ability and status . Nutritional status . Physical activity . Advanced directives . List of other physicians . Hospitalizations, surgeries, and ER visits in previous 12 months . Vitals . Screenings to include cognitive, depression, and falls . Referrals and appointments  In addition, I have reviewed and discussed with patient certain preventive protocols, quality metrics, and best practice recommendations. A written personalized care plan for preventive services as well as general preventive health  recommendations were provided to patient.     Merceda Elks, LPN   [id number]

## 2017-04-10 NOTE — Patient Instructions (Signed)
Jeanette Compton , Thank you for taking time to come for your Medicare Wellness Visit. I appreciate your ongoing commitment to your health goals. Please review the following plan we discussed and let me know if I can assist you in the future.   Screening recommendations/referrals: Colonoscopy: 2023 Mammogram: 2020 Bone Density: Discuss with PCP Recommended yearly ophthalmology/optometry visit for glaucoma screening and checkup Recommended yearly dental visit for hygiene and checkup  Vaccinations: Influenza vaccine: Done Pneumococcal vaccine: Discuss with PCP- will not need until 66 Tdap vaccine: Discuss with PCP Shingles vaccine: Discuss with PCP    Advanced directives: Discussed in office. Talk with your children and bring a completed copy back to [place] for your records.   Conditions/risks identified:  None  Next appointment: 09/10/2017  Preventive Care 40-64 Years, Female Preventive care refers to lifestyle choices and visits with your health care provider that can promote health and wellness. What does preventive care include?  A yearly physical exam. This is also called an annual well check.  Dental exams once or twice a year.  Routine eye exams. Ask your health care provider how often you should have your eyes checked.  Personal lifestyle choices, including:  Daily care of your teeth and gums.  Regular physical activity.  Eating a healthy diet.  Avoiding tobacco and drug use.  Limiting alcohol use.  Practicing safe sex.  Taking low-dose aspirin daily starting at age 32.  Taking vitamin and mineral supplements as recommended by your health care provider. What happens during an annual well check? The services and screenings done by your health care provider during your annual well check will depend on your age, overall health, lifestyle risk factors, and family history of disease. Counseling  Your health care provider may ask you questions about your:  Alcohol  use.  Tobacco use.  Drug use.  Emotional well-being.  Home and relationship well-being.  Sexual activity.  Eating habits.  Work and work Statistician.  Method of birth control.  Menstrual cycle.  Pregnancy history. Screening  You may have the following tests or measurements:  Height, weight, and BMI.  Blood pressure.  Lipid and cholesterol levels. These may be checked every 5 years, or more frequently if you are over 12 years old.  Skin check.  Lung cancer screening. You may have this screening every year starting at age 71 if you have a 30-pack-year history of smoking and currently smoke or have quit within the past 15 years.  Fecal occult blood test (FOBT) of the stool. You may have this test every year starting at age 88.  Flexible sigmoidoscopy or colonoscopy. You may have a sigmoidoscopy every 5 years or a colonoscopy every 10 years starting at age 20.  Hepatitis C blood test.  Hepatitis B blood test.  Sexually transmitted disease (STD) testing.  Diabetes screening. This is done by checking your blood sugar (glucose) after you have not eaten for a while (fasting). You may have this done every 1-3 years.  Mammogram. This may be done every 1-2 years. Talk to your health care provider about when you should start having regular mammograms. This may depend on whether you have a family history of breast cancer.  BRCA-related cancer screening. This may be done if you have a family history of breast, ovarian, tubal, or peritoneal cancers.  Pelvic exam and Pap test. This may be done every 3 years starting at age 68. Starting at age 25, this may be done every 5 years if you have a  Pap test in combination with an HPV test.  Bone density scan. This is done to screen for osteoporosis. You may have this scan if you are at high risk for osteoporosis. Discuss your test results, treatment options, and if necessary, the need for more tests with your health care  provider. Vaccines  Your health care provider may recommend certain vaccines, such as:  Influenza vaccine. This is recommended every year.  Tetanus, diphtheria, and acellular pertussis (Tdap, Td) vaccine. You may need a Td booster every 10 years.  Zoster vaccine. You may need this after age 64.  Pneumococcal 13-valent conjugate (PCV13) vaccine. You may need this if you have certain conditions and were not previously vaccinated.  Pneumococcal polysaccharide (PPSV23) vaccine. You may need one or two doses if you smoke cigarettes or if you have certain conditions. Talk to your health care provider about which screenings and vaccines you need and how often you need them. This information is not intended to replace advice given to you by your health care provider. Make sure you discuss any questions you have with your health care provider. Document Released: 06/03/2015 Document Revised: 01/25/2016 Document Reviewed: 03/08/2015 Elsevier Interactive Patient Education  2017 [place] Prevention in the Home Falls can cause injuries. They can happen to people of all ages. There are many things you can do to make your home safe and to help prevent falls. What can I do on the outside of my home?  Regularly fix the edges of walkways and driveways and fix any cracks.  Remove anything that might make you trip as you walk through a door, such as a raised step or threshold.  Trim any bushes or trees on the path to your home.  Use bright outdoor lighting.  Clear any walking paths of anything that might make someone trip, such as rocks or tools.  Regularly check to see if handrails are loose or broken. Make sure that both sides of any steps have handrails.  Any raised decks and porches should have guardrails on the edges.  Have any leaves, snow, or ice cleared regularly.  Use sand or salt on walking paths during winter.  Clean up any spills in your garage right away. This includes  oil or grease spills. What can I do in the bathroom?  Use night lights.  Install grab bars by the toilet and in the tub and shower. Do not use towel bars as grab bars.  Use non-skid mats or decals in the tub or shower.  If you need to sit down in the shower, use a plastic, non-slip stool.  Keep the floor dry. Clean up any water that spills on the floor as soon as it happens.  Remove soap buildup in the tub or shower regularly.  Attach bath mats securely with double-sided non-slip rug tape.  Do not have throw rugs and other things on the floor that can make you trip. What can I do in the bedroom?  Use night lights.  Make sure that you have a light by your bed that is easy to reach.  Do not use any sheets or blankets that are too big for your bed. They should not hang down onto the floor.  Have a firm chair that has side arms. You can use this for support while you get dressed.  Do not have throw rugs and other things on the floor that can make you trip. What can I do in the kitchen?  Clean  up any spills right away.  Avoid walking on wet floors.  Keep items that you use a lot in easy-to-reach places.  If you need to reach something above you, use a strong step stool that has a grab bar.  Keep electrical cords out of the way.  Do not use floor polish or wax that makes floors slippery. If you must use wax, use non-skid floor wax.  Do not have throw rugs and other things on the floor that can make you trip. What can I do with my stairs?  Do not leave any items on the stairs.  Make sure that there are handrails on both sides of the stairs and use them. Fix handrails that are broken or loose. Make sure that handrails are as long as the stairways.  Check any carpeting to make sure that it is firmly attached to the stairs. Fix any carpet that is loose or worn.  Avoid having throw rugs at the top or bottom of the stairs. If you do have throw rugs, attach them to the floor  with carpet tape.  Make sure that you have a light switch at the top of the stairs and the bottom of the stairs. If you do not have them, ask someone to add them for you. What else can I do to help prevent falls?  Wear shoes that:  Do not have high heels.  Have rubber bottoms.  Are comfortable and fit you well.  Are closed at the toe. Do not wear sandals.  If you use a stepladder:  Make sure that it is fully opened. Do not climb a closed stepladder.  Make sure that both sides of the stepladder are locked into place.  Ask someone to hold it for you, if possible.  Clearly mark and make sure that you can see:  Any grab bars or handrails.  First and last steps.  Where the edge of each step is.  Use tools that help you move around (mobility aids) if they are needed. These include:  Canes.  Walkers.  Scooters.  Crutches.  Turn on the lights when you go into a dark area. Replace any light bulbs as soon as they burn out.  Set up your furniture so you have a clear path. Avoid moving your furniture around.  If any of your floors are uneven, fix them.  If there are any pets around you, be aware of where they are.  Review your medicines with your doctor. Some medicines can make you feel dizzy. This can increase your chance of falling. Ask your doctor what other things that you can do to help prevent falls. This information is not intended to replace advice given to you by your health care provider. Make sure you discuss any questions you have with your health care provider. Document Released: 03/03/2009 Document Revised: 10/13/2015 Document Reviewed: 06/11/2014 Elsevier Interactive Patient Education  2017 [place] for Adults  A healthy lifestyle and preventive care can promote health and wellness. Preventive health guidelines for adults include the following key practices.  . A routine yearly physical is a good way to check with your health care  provider about your health and preventive screening. It is a chance to share any concerns and updates on your health and to receive a thorough exam.  . Visit your dentist for a routine exam and preventive care every 6 months. Brush your teeth twice a day and floss once a day. Good oral hygiene prevents tooth  decay and gum disease.  . The frequency of eye exams is based on your age, health, family medical history, use  of contact lenses, and other factors. Follow your health care provider's ecommendations for frequency of eye exams.  . Eat a healthy diet. Foods like vegetables, fruits, whole grains, low-fat dairy products, and lean protein foods contain the nutrients you need without too many calories. Decrease your intake of foods high in solid fats, added sugars, and salt. Eat the right amount of calories for you. Get information about a proper diet from your health care provider, if necessary.  . Regular physical exercise is one of the most important things you can do for your health. Most adults should get at least 150 minutes of moderate-intensity exercise (any activity that increases your heart rate and causes you to sweat) each week. In addition, most adults need muscle-strengthening exercises on 2 or more days a week.  Silver Sneakers may be a benefit available to you. To determine eligibility, you may visit the website: www.silversneakers.com or contact program at (279)055-9183 Mon-Fri between 8AM-8PM.   . Maintain a healthy weight. The body mass index (BMI) is a screening tool to identify possible weight problems. It provides an estimate of body fat based on height and weight. Your health care provider can find your BMI and can help you achieve or maintain a healthy weight.   For adults 20 years and older: ? A BMI below 18.5 is considered underweight. ? A BMI of 18.5 to 24.9 is normal. ? A BMI of 25 to 29.9 is considered overweight. ? A BMI of 30 and above is considered obese.   .  Maintain normal blood lipids and cholesterol levels by exercising and minimizing your intake of saturated fat. Eat a balanced diet with plenty of fruit and vegetables. Blood tests for lipids and cholesterol should begin at age 39 and be repeated every 5 years. If your lipid or cholesterol levels are high, you are over 50, or you are at high risk for heart disease, you may need your cholesterol levels checked more frequently. Ongoing high lipid and cholesterol levels should be treated with medicines if diet and exercise are not working.  . If you smoke, find out from your health care provider how to quit. If you do not use tobacco, please do not start.  . If you choose to drink alcohol, please do not consume more than 2 drinks per day. One drink is considered to be 12 ounces (355 mL) of beer, 5 ounces (148 mL) of wine, or 1.5 ounces (44 mL) of liquor.  . If you are 31-23 years old, ask your health care provider if you should take aspirin to prevent strokes.  . Use sunscreen. Apply sunscreen liberally and repeatedly throughout the day. You should seek shade when your shadow is shorter than you. Protect yourself by wearing long sleeves, pants, a wide-brimmed hat, and sunglasses year round, whenever you are outdoors.  . Once a month, do a whole body skin exam, using a mirror to look at the skin on your back. Tell your health care provider of new moles, moles that have irregular borders, moles that are larger than a pencil eraser, or moles that have changed in shape or color.  Please bring a copy of your POA (Power of [place]) and/or Living Will to your next appointment.

## 2017-04-16 ENCOUNTER — Ambulatory Visit (INDEPENDENT_AMBULATORY_CARE_PROVIDER_SITE_OTHER): Payer: Medicare Other | Admitting: Orthopaedic Surgery

## 2017-04-16 ENCOUNTER — Encounter: Payer: Self-pay | Admitting: Orthopaedic Surgery

## 2017-04-16 VITALS — BP 162/78 | HR 57 | Temp 96.7°F | Ht 66.0 in | Wt 213.0 lb

## 2017-04-16 DIAGNOSIS — M25551 Pain in right hip: Secondary | ICD-10-CM

## 2017-04-16 NOTE — Progress Notes (Signed)
Patient Jeanette Compton [name], female DOB:1956-03-20, 61 y.o. YTK:[id number]  Chief Complaint  Patient presents with  . Follow-up    Right Hip    HPI  Jeanette Compton is a 61 y.o. female who has right hip pain trochanteric.  She is much improved.  She has little pain or discomfort now.  She is walking well.  She has no new trauma.  She has no weakness. HPI  Body mass index is 34.38 kg/m.  ROS  Review of Systems  HENT: Negative for congestion.   Respiratory: Negative for cough and shortness of breath.   Cardiovascular: Negative for chest pain and leg swelling.  Endocrine: Positive for cold intolerance.  Musculoskeletal: Positive for arthralgias, gait problem and joint swelling.  Allergic/Immunologic: Positive for environmental allergies.  Neurological: Negative for numbness.  All other systems reviewed and are negative.   Past Medical History:  Diagnosis Date  . Arthritis    knee , ankle and back  . Bronchitis   . Cataract   . Hyperlipidemia   . Hypertension   . Neuralgia, post-herpetic 11/13/2016  . Thyroid disease     Past Surgical History:  Procedure Laterality Date  . ABDOMINAL HYSTERECTOMY     fibroid  . BREAST SURGERY    . COLONOSCOPY  09/13/2011   Procedure: COLONOSCOPY;  Surgeon: Rogene Houston, MD;  Location: [facility];  Service: Endoscopy;  Laterality: N/A;  1030    Family History  Problem Relation Age of Onset  . Stroke Mother   . Diabetes Mother   . Alcohol abuse Father   . Early death Father   . Early death Sister        pneumonia  . Cancer Brother   . Alzheimer's disease Sister   . COPD Brother   . Colon cancer Neg Hx     Social History Social History   Tobacco Use  . Smoking status: Former Smoker    Packs/day: 0.50    Years: 3.00    Pack years: 1.50  . Smokeless tobacco: Never Used  Substance Use Topics  . Alcohol use: No  . Drug use: No    Allergies  Allergen Reactions  . Lisinopril Swelling    Lips swelled  . Naproxen  Itching and Other (See Comments)    "hurts my stomach"    Current Outpatient Medications  Medication Sig Dispense Refill  . aspirin EC 81 MG tablet Take 81 mg by mouth at bedtime.    Marland Kitchen HYDROcodone-acetaminophen (NORCO/VICODIN) 5-325 MG tablet TAKE (1) TABLET BY MOUTH EVERY 6 HOURS AS NEEDED FOR MODERATE PAIN. MUST LAST 30 DAYS. 50 tablet 0  . losartan-hydrochlorothiazide (HYZAAR) 50-12.5 MG tablet Take 1 tablet by mouth daily. 90 tablet 3  . Multiple Vitamin (MULTIVITAMIN WITH MINERALS) TABS tablet Take 1 tablet by mouth daily. One-A-Day    . nabumetone (RELAFEN) 750 MG tablet Take 1 tablet (750 mg total) by mouth 2 (two) times daily. One by mouth twice a day after eating. 60 tablet 5  . pravastatin (PRAVACHOL) 40 MG tablet Take 1 tablet (40 mg total) by mouth daily. (Patient taking differently: Take 40 mg by mouth at bedtime. ) 30 tablet 11   No current facility-administered medications for this visit.      Physical Exam  Blood pressure (!) 162/78, pulse (!) 57, temperature (!) 96.7 F (35.9 C), height 5\' 6"  (1.676 m), weight 213 lb (96.6 kg).  Constitutional: overall normal hygiene, normal nutrition, well developed, normal grooming, normal body habitus. Assistive  device:none  Musculoskeletal: gait and station Limp none, muscle tone and strength are normal, no tremors or atrophy is present.  .  Neurological: coordination overall normal.  Deep tendon reflex/nerve stretch intact.  Sensation normal.  Cranial nerves II-XII intact.   Skin:   Normal overall no scars, lesions, ulcers or rashes. No psoriasis.  Psychiatric: Alert and oriented x 3.  Recent memory intact, remote memory unclear.  Normal mood and affect. Well groomed.  Good eye contact.  Cardiovascular: overall no swelling, no varicosities, no edema bilaterally, normal temperatures of the legs and arms, no clubbing, cyanosis and good capillary refill.  Lymphatic: palpation is normal.  All other systems reviewed and are  negative   Right hip has full ROM and no pain, no pain of the trochanteric area.  The patient has been educated about the nature of the problem(s) and counseled on treatment options.  The patient appeared to understand what I have discussed and is in agreement with it.  Encounter Diagnosis  Name Primary?  . Right hip pain Yes    PLAN Call if any problems.  Precautions discussed.  Continue current medications.   Return to clinic prn   Electronically L[place], MD 11/27/20189:23 AM

## 2017-04-23 ENCOUNTER — Telehealth (INDEPENDENT_AMBULATORY_CARE_PROVIDER_SITE_OTHER): Payer: Self-pay | Admitting: *Deleted

## 2017-04-23 ENCOUNTER — Encounter (INDEPENDENT_AMBULATORY_CARE_PROVIDER_SITE_OTHER): Payer: Self-pay | Admitting: *Deleted

## 2017-04-23 NOTE — Telephone Encounter (Signed)
Referring MD/PCP: nelson   Procedure: tcs  Reason/Indication:  Hx polyps  Has patient had this procedure before?  Yes, 2013             If so, when, by whom and where?    Is there a family history of colon cancer?  no             Who?  What age when diagnosed?    Is patient diabetic?   no                                                  Does patient have prosthetic heart valve or mechanical valve?  no  Do you have a pacemaker?  no  Has patient ever had endocarditis? no  Has patient had joint replacement within last 12 months?  no  Does patient tend to be constipated or take laxatives? no  Does patient have a history of alcohol/drug use?  no  Is patient on Coumadin, Plavix and/or Aspirin? yes  Medications: see epic  Allergies: nkda  Medication Adjustment per Dr Laural Golden: asa 2 days  Procedure date & time: 05/22/17 at 830

## 2017-04-23 NOTE — Telephone Encounter (Signed)
agree

## 2017-05-01 ENCOUNTER — Ambulatory Visit (INDEPENDENT_AMBULATORY_CARE_PROVIDER_SITE_OTHER): Payer: Medicare Other | Admitting: Orthopaedic Surgery

## 2017-05-01 ENCOUNTER — Encounter: Payer: Self-pay | Admitting: Orthopaedic Surgery

## 2017-05-01 VITALS — BP 163/91 | HR 64 | Temp 97.9°F | Ht 66.0 in | Wt 215.0 lb

## 2017-05-01 DIAGNOSIS — M25551 Pain in right hip: Secondary | ICD-10-CM

## 2017-05-01 MED ORDER — HYDROCODONE-ACETAMINOPHEN 5-325 MG PO TABS: ORAL_TABLET | ORAL | 0 refills | Status: DC

## 2017-05-01 NOTE — Progress Notes (Signed)
Patient AC:ZYSAYT Jeanette Compton, female DOB:[birth year], 61 y.o. KZS:[id number]  Chief Complaint  Patient presents with  . Follow-up    Right hip pain    HPI  Jeanette Compton is a 61 y.o. female who has recurrent right hip trochanteric pain.  She has no trauma. She had increased pain with the cold weather and snow.  She has no redness, no weakness, no paresthesias. HPI  Body mass index is 34.7 kg/m.  ROS  Review of Systems  HENT: Negative for congestion.   Respiratory: Negative for cough and shortness of breath.   Cardiovascular: Negative for chest pain and leg swelling.  Endocrine: Positive for cold intolerance.  Musculoskeletal: Positive for arthralgias, gait problem and joint swelling.  Allergic/Immunologic: Positive for environmental allergies.  Neurological: Negative for numbness.  All other systems reviewed and are negative.   Past Medical History:  Diagnosis Date  . Arthritis    knee , ankle and back  . Bronchitis   . Cataract   . Hyperlipidemia   . Hypertension   . Neuralgia, post-herpetic 11/13/2016  . Thyroid disease     Past Surgical History:  Procedure Laterality Date  . ABDOMINAL HYSTERECTOMY     fibroid  . BREAST SURGERY    . COLONOSCOPY  09/13/2011   Procedure: COLONOSCOPY;  Surgeon: Rogene Houston, MD;  Location: [facility];  Service: Endoscopy;  Laterality: N/A;  1030    Family History  Problem Relation Age of Onset  . Stroke Mother   . Diabetes Mother   . Alcohol abuse Father   . Early death Father   . Early death Sister        pneumonia  . Cancer Brother   . Alzheimer's disease Sister   . COPD Brother   . Colon cancer Neg Hx     Social History Social History   Tobacco Use  . Smoking status: Former Smoker    Packs/day: 0.50    Years: 3.00    Pack years: 1.50  . Smokeless tobacco: Never Used  Substance Use Topics  . Alcohol use: No  . Drug use: No    Allergies  Allergen Reactions  . Lisinopril Swelling    Lips swelled  .  Naproxen Itching and Other (See Comments)    "hurts my stomach"    Current Outpatient Medications  Medication Sig Dispense Refill  . aspirin EC 81 MG tablet Take 81 mg by mouth at bedtime.    Marland Kitchen HYDROcodone-acetaminophen (NORCO/VICODIN) 5-325 MG tablet TAKE (1) TABLET BY MOUTH EVERY 6 HOURS AS NEEDED FOR MODERATE PAIN. MUST LAST 30 DAYS. 50 tablet 0  . losartan-hydrochlorothiazide (HYZAAR) 50-12.5 MG tablet Take 1 tablet by mouth daily. 90 tablet 3  . Multiple Vitamin (MULTIVITAMIN WITH MINERALS) TABS tablet Take 1 tablet by mouth daily. One-A-Day    . nabumetone (RELAFEN) 750 MG tablet Take 1 tablet (750 mg total) by mouth 2 (two) times daily. One by mouth twice a day after eating. 60 tablet 5  . pravastatin (PRAVACHOL) 40 MG tablet Take 1 tablet (40 mg total) by mouth daily. (Patient taking differently: Take 40 mg by mouth at bedtime. ) 30 tablet 11   No current facility-administered medications for this visit.      Physical Exam  Blood pressure (!) 163/91, pulse 64, temperature 97.9 F (36.6 C), height 5\' 6"  (1.676 m), weight 215 lb (97.5 kg).  Constitutional: overall normal hygiene, normal nutrition, well developed, normal grooming, normal body habitus. Assistive device:none  Musculoskeletal: gait and  station Limp right, muscle tone and strength are normal, no tremors or atrophy is present.  .  Neurological: coordination overall normal.  Deep tendon reflex/nerve stretch intact.  Sensation normal.  Cranial nerves II-XII intact.   Skin:   Normal overall no scars, lesions, ulcers or rashes. No psoriasis.  Psychiatric: Alert and oriented x 3.  Recent memory intact, remote memory unclear.  Normal mood and affect. Well groomed.  Good eye contact.  Cardiovascular: overall no swelling, no varicosities, no edema bilaterally, normal temperatures of the legs and arms, no clubbing, cyanosis and good capillary refill.  Lymphatic: palpation is normal.  All other systems reviewed and are  negative   Right hip has pain over lateral trochanteric area.  She has no swelling, no redness. ROM is full but tender.  She has a slight limp to the right.  NV is intact.  Muscle tone and strength are normal.  The patient has been educated about the nature of the problem(s) and counseled on treatment options.  The patient appeared to understand what I have discussed and is in agreement with it.  Encounter Diagnosis  Name Primary?  . Right hip pain Yes    PLAN Call if any problems.  Precautions discussed.  Continue current medications.   Return to clinic 6 weeks   I have reviewed the [place] web site prior to prescribing narcotic medicine for this patient.  Electronically Signed Sanjuana Kava, MD 12/12/20189:59 AM

## 2017-05-19 ENCOUNTER — Emergency Department (HOSPITAL_COMMUNITY)
Admission: EM | Admit: 2017-05-19 | Discharge: 2017-05-20 | Disposition: A | Discharge status: Home / Self Care | Payer: Medicare Other | Attending: Emergency Medicine | Admitting: Emergency Medicine

## 2017-05-19 ENCOUNTER — Emergency Department (HOSPITAL_COMMUNITY): Payer: Medicare Other

## 2017-05-19 ENCOUNTER — Other Ambulatory Visit: Payer: Self-pay

## 2017-05-19 ENCOUNTER — Encounter (HOSPITAL_COMMUNITY): Payer: Self-pay | Admitting: Emergency Medicine

## 2017-05-19 DIAGNOSIS — Z7982 Long term (current) use of aspirin: Secondary | ICD-10-CM | POA: Insufficient documentation

## 2017-05-19 DIAGNOSIS — M79641 Pain in right hand: Secondary | ICD-10-CM | POA: Diagnosis not present

## 2017-05-19 DIAGNOSIS — Z87891 Personal history of nicotine dependence: Secondary | ICD-10-CM | POA: Diagnosis not present

## 2017-05-19 DIAGNOSIS — I10 Essential (primary) hypertension: Secondary | ICD-10-CM | POA: Diagnosis not present

## 2017-05-19 DIAGNOSIS — E785 Hyperlipidemia, unspecified: Secondary | ICD-10-CM | POA: Diagnosis not present

## 2017-05-19 DIAGNOSIS — Z79899 Other long term (current) drug therapy: Secondary | ICD-10-CM | POA: Insufficient documentation

## 2017-05-19 LAB — CBG MONITORING, ED: Glucose-Capillary: 166 mg/dL — ABNORMAL HIGH (ref 65–99)

## 2017-05-19 MED ORDER — IBUPROFEN 400 MG PO TABS
400.0000 mg | ORAL_TABLET | Freq: Once | ORAL | Status: AC
  Administered 2017-05-20: 400 mg via ORAL
  Filled 2017-05-19: qty 1

## 2017-05-19 NOTE — ED Triage Notes (Signed)
Pt c/o right hand pain and swelling x 1 day, reports no injury

## 2017-05-19 NOTE — ED Provider Notes (Addendum)
[facility] EMERGENCY DEPARTMENT Provider Note   CSN: [id number] Arrival date & time: 05/19/17  2047     History   Chief Complaint Chief Complaint  Patient presents with  . Hand Pain    right    HPI Jeanette Compton is a 61 y.o. female.  Patient presents with a one-day history of right hand pain and swelling.  Denies any falls or trauma.  States this started yesterday evening about her fifth metacarpal and fifth digit.  Denies any excessive use of her hand.  Denies any new exposures to cleaning products.  Denies any numbness or weakness.  No tingling.  She took some ibuprofen today with mild relief.  Denies fever, chills, nausea or vomiting.  Denies any erythema.  Denies any pain in her other joints.  No pain to her right wrist, forearm, elbow, shoulder.   The history is provided by the patient.  Hand Pain  Pertinent negatives include no chest pain, no abdominal pain, no headaches and no shortness of breath.    Past Medical History:  Diagnosis Date  . Arthritis    knee , ankle and back  . Bronchitis   . Cataract   . Hyperlipidemia   . Hypertension   . Neuralgia, post-herpetic 11/13/2016  . Thyroid disease     Patient Active Problem List   Diagnosis Date Noted  . Neuralgia, post-herpetic 11/13/2016  . Elevated blood sugar 11/13/2016  . History of colonic polyps 10/05/2016  . HLD (hyperlipidemia) 09/10/2016  . Essential hypertension 09/10/2016  . Primary vitiligo 09/10/2016  . Osteoarthritis of knee 09/10/2016  . Chronic lower back pain 09/10/2016    Past Surgical History:  Procedure Laterality Date  . ABDOMINAL HYSTERECTOMY     fibroid  . BREAST SURGERY    . COLONOSCOPY  09/13/2011   Procedure: COLONOSCOPY;  Surgeon: Rogene Houston, MD;  Location: [facility];  Service: Endoscopy;  Laterality: N/A;  1030    OB History    No data available       Home Medications    Prior to Admission medications   Medication Sig Start Date End Date Taking?  Authorizing Provider  aspirin EC 81 MG tablet Take 81 mg by mouth at bedtime.    [provider]  HYDROcodone-acetaminophen (NORCO/VICODIN) 5-325 MG tablet TAKE (1) TABLET BY MOUTH EVERY 6 HOURS AS NEEDED FOR MODERATE PAIN. MUST LAST 30 DAYS. 05/01/17   Sanjuana Kava, MD  losartan-hydrochlorothiazide (HYZAAR) 50-12.5 MG tablet Take 1 tablet by mouth daily. 03/12/17   Raylene Everts, MD  Multiple Vitamin (MULTIVITAMIN WITH MINERALS) TABS tablet Take 1 tablet by mouth daily. One-A-Day    [provider]  nabumetone (RELAFEN) 750 MG tablet Take 1 tablet (750 mg total) by mouth 2 (two) times daily. One by mouth twice a day after eating. 01/31/17   Sanjuana Kava, MD  pravastatin (PRAVACHOL) 40 MG tablet Take 1 tablet (40 mg total) by mouth daily. Patient taking differently: Take 40 mg by mouth at bedtime.  09/10/16   Raylene Everts, MD    Family History Family History  Problem Relation Age of Onset  . Stroke Mother   . Diabetes Mother   . Alcohol abuse Father   . Early death Father   . Early death Sister        pneumonia  . Cancer Brother   . Alzheimer's disease Sister   . COPD Brother   . Colon cancer Neg Hx     Social History  Social History   Tobacco Use  . Smoking status: Former Smoker    Packs/day: 0.50    Years: 3.00    Pack years: 1.50  . Smokeless tobacco: Never Used  Substance Use Topics  . Alcohol use: No  . Drug use: No     Allergies   Lisinopril and Naproxen   Review of Systems Review of Systems  Constitutional: Negative for activity change, appetite change and fever.  HENT: Negative for congestion.   Respiratory: Negative for cough, chest tightness and shortness of breath.   Cardiovascular: Negative for chest pain.  Gastrointestinal: Negative for abdominal pain, nausea and vomiting.  Genitourinary: Negative for dysuria and hematuria.  Musculoskeletal: Positive for arthralgias and myalgias. Negative for back pain and neck pain.    Skin: Negative for rash.  Neurological: Negative for dizziness, weakness, numbness and headaches.   all other systems are negative except as noted in the HPI and PMH.     Physical Exam Updated Vital Signs BP (!) 159/85 (BP Location: Left Arm)   Pulse 65   Temp 98.3 F (36.8 C) (Oral)   Resp 18   Ht 5\' 5"  (1.651 m)   Wt 97.1 kg (214 lb)   SpO2 96%   BMI 35.61 kg/m   Physical Exam  Constitutional: She is oriented to person, place, and time. She appears well-developed and well-nourished. No distress.  HENT:  Head: Normocephalic and atraumatic.  Mouth/Throat: Oropharynx is clear and moist. No oropharyngeal exudate.  Eyes: Conjunctivae and EOM are normal. Pupils are equal, round, and reactive to light.  Neck: Normal range of motion. Neck supple.  No meningismus.  Cardiovascular: Normal rate, regular rhythm, normal heart sounds and intact distal pulses.  No murmur heard. Pulmonary/Chest: Effort normal and breath sounds normal. No respiratory distress.  Abdominal: Soft. There is no tenderness. There is no rebound and no guarding.  Musculoskeletal: Normal range of motion. She exhibits tenderness. She exhibits no edema.  There is mild diffuse tenderness across palmar right hand worse over right fifth metacarpal.  Reduced range of motion of MCP and PIP and DIP joints of the right fifth digit.  No overlying erythema or warmth.  Intact radial pulse.  Flexion and extension of the wrist intact. No tenderness to palpation across flexor tendon sheath  Neurological: She is alert and oriented to person, place, and time. No cranial nerve deficit. She exhibits normal muscle tone. Coordination normal.   5/5 strength throughout. CN 2-12 intact.Equal grip strength.   Skin: Skin is warm.  Psychiatric: She has a normal mood and affect. Her behavior is normal.  Nursing note and vitals reviewed.        ED Treatments / Results  Labs (all labs ordered are listed, but only abnormal results are  displayed) Labs Reviewed  CBG MONITORING, ED    EKG  EKG Interpretation None       Radiology Dg Hand Complete Right  Result Date: 05/19/2017 CLINICAL DATA:  Right hand pain and swelling since yesterday. No known injury. EXAM: RIGHT HAND - COMPLETE 3+ VIEW COMPARISON:  None. FINDINGS: Degenerative changes in the interphalangeal joints and radiocarpal joints. No evidence of acute fracture or dislocation. No focal bone lesion or bone destruction. No erosions. Soft tissues are unremarkable. IMPRESSION: Degenerative changes in the right hand. No acute bony abnormalities. Electronically Signed   By: Lucienne Capers M.D.   On: 05/19/2017 22:00    Procedures Procedures (including critical care time)  SPLINT APPLICATION Date/Time: 4:19 PM Authorized by: Wyvonnia Dusky,  Sharleen Szczesny Consent: Verbal consent obtained. Risks and benefits: risks, benefits and alternatives were discussed Consent given by: patient Splint applied by: nursing staff  Location details:right wrist Splint type: velcro  Supplies used: velcro wrist splint Post-procedure: The splinted body part was neurovascularly unchanged following the procedure. Patient tolerance: Patient tolerated the procedure well with no immediate complications.     Medications Ordered in ED Medications  ibuprofen (ADVIL,MOTRIN) tablet 400 mg (not administered)     Initial Impression / Assessment and Plan / ED Course  I have reviewed the triage vital signs and the nursing notes.  Pertinent labs & imaging results that were available during my care of the patient were reviewed by me and considered in my medical decision making (see chart for details).    Atraumatic right hand pain.  No fever.  No evidence of cellulitis or infection.  Neurovascularly intact.  X-rays negative.  Patient has no evidence of septic joint, flexor tenosynovitis, DVT.  We will treat supportively with anti-inflammatories, splint, orthopedic follow-up.  Return  precautions discussed including worsening pain, spreading redness, numbness or tingling or other concerns.  Final Clinical Impressions(s) / ED Diagnoses   Final diagnoses:  Right hand pain    ED Discharge Orders    None       Cerria Randhawa, Annie Main, MD 05/20/17 4536    Ezequiel Essex, MD 05/30/17 2044

## 2017-05-20 ENCOUNTER — Other Ambulatory Visit: Payer: Self-pay

## 2017-05-20 ENCOUNTER — Other Ambulatory Visit: Payer: Self-pay | Admitting: Family Medicine

## 2017-05-20 DIAGNOSIS — Z1231 Encounter for screening mammogram for malignant neoplasm of breast: Secondary | ICD-10-CM

## 2017-05-20 MED ORDER — IBUPROFEN 400 MG PO TABS: 400.0000 mg | ORAL_TABLET | Freq: Four times a day (QID) | ORAL | 0 refills | Status: DC | PRN

## 2017-05-20 NOTE — Discharge Instructions (Signed)
Your x-rays negative.  Take anti-inflammatories and use ice as prescribed.  Follow-up with Dr. Aline Brochure.  Return to the ED if you develop worsening pain, redness, fever or any other concerns.

## 2017-05-22 ENCOUNTER — Ambulatory Visit: Payer: Medicare Other | Admitting: Orthopaedic Surgery

## 2017-05-22 ENCOUNTER — Ambulatory Visit (HOSPITAL_COMMUNITY): Admission: RE | Admit: 2017-05-22 | Payer: Medicare Other | Source: Ambulatory Visit | Admitting: Internal Medicine

## 2017-05-22 ENCOUNTER — Encounter (HOSPITAL_COMMUNITY): Admission: RE | Payer: Self-pay | Source: Ambulatory Visit

## 2017-05-22 SURGERY — COLONOSCOPY
Anesthesia: Moderate Sedation

## 2017-05-29 ENCOUNTER — Encounter (HOSPITAL_COMMUNITY): Payer: Self-pay

## 2017-05-29 ENCOUNTER — Ambulatory Visit (HOSPITAL_COMMUNITY)
Admission: RE | Admit: 2017-05-29 | Discharge: 2017-05-29 | Disposition: A | Discharge status: Home / Self Care | Payer: Medicare Other | Source: Ambulatory Visit | Attending: Family Medicine | Admitting: Family Medicine

## 2017-05-29 ENCOUNTER — Other Ambulatory Visit: Payer: Self-pay | Admitting: Family Medicine

## 2017-05-29 DIAGNOSIS — Z1231 Encounter for screening mammogram for malignant neoplasm of breast: Secondary | ICD-10-CM | POA: Insufficient documentation

## 2017-05-29 DIAGNOSIS — R928 Other abnormal and inconclusive findings on diagnostic imaging of breast: Secondary | ICD-10-CM

## 2017-05-31 ENCOUNTER — Telehealth: Payer: Self-pay | Admitting: Orthopaedic Surgery

## 2017-05-31 NOTE — Telephone Encounter (Signed)
Hydrocodone-Acetaminophen  5/325 mg Qty 75 Tablets   Patient uses [employer].

## 2017-06-03 ENCOUNTER — Other Ambulatory Visit: Payer: Self-pay

## 2017-06-03 ENCOUNTER — Encounter (HOSPITAL_COMMUNITY): Payer: Self-pay | Admitting: Emergency Medicine

## 2017-06-03 ENCOUNTER — Emergency Department (HOSPITAL_COMMUNITY)
Admission: EM | Admit: 2017-06-03 | Discharge: 2017-06-03 | Disposition: A | Discharge status: Home / Self Care | Payer: Medicare Other | Attending: Emergency Medicine | Admitting: Emergency Medicine

## 2017-06-03 DIAGNOSIS — Y999 Unspecified external cause status: Secondary | ICD-10-CM | POA: Diagnosis not present

## 2017-06-03 DIAGNOSIS — X58XXXA Exposure to other specified factors, initial encounter: Secondary | ICD-10-CM | POA: Diagnosis not present

## 2017-06-03 DIAGNOSIS — Z79899 Other long term (current) drug therapy: Secondary | ICD-10-CM | POA: Insufficient documentation

## 2017-06-03 DIAGNOSIS — Y939 Activity, unspecified: Secondary | ICD-10-CM | POA: Diagnosis not present

## 2017-06-03 DIAGNOSIS — M1611 Unilateral primary osteoarthritis, right hip: Secondary | ICD-10-CM | POA: Diagnosis not present

## 2017-06-03 DIAGNOSIS — Z888 Allergy status to other drugs, medicaments and biological substances status: Secondary | ICD-10-CM | POA: Insufficient documentation

## 2017-06-03 DIAGNOSIS — Z886 Allergy status to analgesic agent status: Secondary | ICD-10-CM | POA: Diagnosis not present

## 2017-06-03 DIAGNOSIS — Z87891 Personal history of nicotine dependence: Secondary | ICD-10-CM | POA: Diagnosis not present

## 2017-06-03 DIAGNOSIS — Y929 Unspecified place or not applicable: Secondary | ICD-10-CM | POA: Diagnosis not present

## 2017-06-03 DIAGNOSIS — I10 Essential (primary) hypertension: Secondary | ICD-10-CM | POA: Insufficient documentation

## 2017-06-03 DIAGNOSIS — Z7982 Long term (current) use of aspirin: Secondary | ICD-10-CM | POA: Diagnosis not present

## 2017-06-03 DIAGNOSIS — S76911A Strain of unspecified muscles, fascia and tendons at thigh level, right thigh, initial encounter: Secondary | ICD-10-CM | POA: Diagnosis not present

## 2017-06-03 DIAGNOSIS — S76011A Strain of muscle, fascia and tendon of right hip, initial encounter: Secondary | ICD-10-CM | POA: Diagnosis not present

## 2017-06-03 DIAGNOSIS — T148XXA Other injury of unspecified body region, initial encounter: Secondary | ICD-10-CM

## 2017-06-03 DIAGNOSIS — S79921A Unspecified injury of right thigh, initial encounter: Secondary | ICD-10-CM | POA: Diagnosis present

## 2017-06-03 MED ORDER — HYDROCODONE-ACETAMINOPHEN 5-325 MG PO TABS: ORAL_TABLET | ORAL | 0 refills | Status: DC

## 2017-06-03 MED ORDER — IBUPROFEN 400 MG PO TABS: 400.0000 mg | ORAL_TABLET | Freq: Four times a day (QID) | ORAL | 0 refills | Status: DC | PRN

## 2017-06-03 MED ORDER — CYCLOBENZAPRINE HCL 10 MG PO TABS
10.0000 mg | ORAL_TABLET | Freq: Once | ORAL | Status: AC
Start: 2017-06-03 — End: 2017-06-03
  Administered 2017-06-03: 10 mg via ORAL
  Filled 2017-06-03: qty 1

## 2017-06-03 MED ORDER — CYCLOBENZAPRINE HCL 10 MG PO TABS: 10.0000 mg | ORAL_TABLET | Freq: Three times a day (TID) | ORAL | 0 refills | Status: DC

## 2017-06-03 MED ORDER — IBUPROFEN 400 MG PO TABS
400.0000 mg | ORAL_TABLET | Freq: Once | ORAL | Status: AC
  Administered 2017-06-03: 400 mg via ORAL
  Filled 2017-06-03: qty 1

## 2017-06-03 MED ORDER — DEXAMETHASONE SODIUM PHOSPHATE 10 MG/ML IJ SOLN
10.0000 mg | Freq: Once | INTRAMUSCULAR | Status: AC
  Administered 2017-06-03: 10 mg via INTRAMUSCULAR
  Filled 2017-06-03: qty 1

## 2017-06-03 NOTE — Discharge Instructions (Signed)
Your blood pressure is elevated at 180/87.  Please have this rechecked soon.  Your examination suggests muscle strain/spasm of your upper thigh.  This is probably related to your attempt to protect the advanced arthritis changes in your right hip.  Heating pad to this area may be helpful.  Please use ibuprofen with breakfast, lunch, dinner, and at bedtime.  Please use Flexeril 3 times daily.This medication may cause drowsiness. Please do not drink, drive, or participate in activity that requires concentration while taking this medication.

## 2017-06-03 NOTE — ED Triage Notes (Signed)
Chronic right leg/hip pain. Dx with arthritis. Took ibuprofen with no reliel.

## 2017-06-03 NOTE — ED Provider Notes (Signed)
[facility] EMERGENCY DEPARTMENT Provider Note   CSN: [id number] Arrival date & time: 06/03/17  1542     History   Chief Complaint Chief Complaint  Patient presents with  . Leg Pain    HPI Jeanette Compton is a 62 y.o. female.  Patient is a 62 year old female who presents to the emergency department with a complaint of right leg pain.  The patient states for several months she has been having problems with her hip.  In the last month she has been having increasing pain involving the right leg.  And in the last few days she has been feeling as though the right leg in the thigh area is giving away on her.  She has not had a fall.  She has not had a direct injury to the leg.  She has not had any recent operations or procedures.  She states she has been told that she has arthritis, but she feels that it is getting worse.  She is seen by Dr. Luna Glasgow for her orthopedic medicines, but she states this is getting worse and she wanted to get it checked out.      Past Medical History:  Diagnosis Date  . Arthritis    knee , ankle and back  . Bronchitis   . Cataract   . Hyperlipidemia   . Hypertension   . Neuralgia, post-herpetic 11/13/2016  . Thyroid disease     Patient Active Problem List   Diagnosis Date Noted  . Neuralgia, post-herpetic 11/13/2016  . Elevated blood sugar 11/13/2016  . History of colonic polyps 10/05/2016  . HLD (hyperlipidemia) 09/10/2016  . Essential hypertension 09/10/2016  . Primary vitiligo 09/10/2016  . Osteoarthritis of knee 09/10/2016  . Chronic lower back pain 09/10/2016    Past Surgical History:  Procedure Laterality Date  . ABDOMINAL HYSTERECTOMY     fibroid  . BREAST SURGERY    . COLONOSCOPY  09/13/2011   Procedure: COLONOSCOPY;  Surgeon: Rogene Houston, MD;  Location: [facility];  Service: Endoscopy;  Laterality: N/A;  1030    OB History    No data available       Home Medications    Prior to Admission medications   Medication  Sig Start Date End Date Taking? Authorizing Provider  aspirin EC 81 MG tablet Take 81 mg by mouth at bedtime.    [provider]  HYDROcodone-acetaminophen (NORCO/VICODIN) 5-325 MG tablet TAKE (1) TABLET BY MOUTH EVERY 6 HOURS AS NEEDED FOR MODERATE PAIN. MUST LAST 30 DAYS. 06/03/17   Sanjuana Kava, MD  ibuprofen (ADVIL,MOTRIN) 400 MG tablet Take 1 tablet (400 mg total) by mouth every 6 (six) hours as needed. 05/20/17   Rancour, Annie Main, MD  losartan-hydrochlorothiazide (HYZAAR) 50-12.5 MG tablet Take 1 tablet by mouth daily. 03/12/17   Raylene Everts, MD  Multiple Vitamin (MULTIVITAMIN WITH MINERALS) TABS tablet Take 1 tablet by mouth daily. One-A-Day    [provider]  nabumetone (RELAFEN) 750 MG tablet Take 1 tablet (750 mg total) by mouth 2 (two) times daily. One by mouth twice a day after eating. 01/31/17   Sanjuana Kava, MD  pravastatin (PRAVACHOL) 40 MG tablet Take 1 tablet (40 mg total) by mouth daily. Patient taking differently: Take 40 mg by mouth at bedtime.  09/10/16   Raylene Everts, MD    Family History Family History  Problem Relation Age of Onset  . Stroke Mother   . Diabetes Mother   . Alcohol abuse Father   .  Early death Father   . Early death Sister        pneumonia  . Cancer Brother   . Alzheimer's disease Sister   . COPD Brother   . Colon cancer Neg Hx     Social History Social History   Tobacco Use  . Smoking status: Former Smoker    Packs/day: 0.50    Years: 3.00    Pack years: 1.50  . Smokeless tobacco: Never Used  Substance Use Topics  . Alcohol use: No  . Drug use: No     Allergies   Lisinopril and Naproxen   Review of Systems Review of Systems  Constitutional: Negative for activity change.       All ROS Neg except as noted in HPI  HENT: Negative for nosebleeds.   Eyes: Negative for photophobia and discharge.  Respiratory: Negative for cough, shortness of breath and wheezing.   Cardiovascular: Negative for chest  pain and palpitations.  Gastrointestinal: Negative for abdominal pain and blood in stool.  Genitourinary: Negative for dysuria, frequency and hematuria.  Musculoskeletal: Positive for arthralgias. Negative for back pain and neck pain.       Hip pain  Skin: Negative.   Neurological: Negative for dizziness, seizures and speech difficulty.  Psychiatric/Behavioral: Negative for confusion and hallucinations.     Physical Exam Updated Vital Signs BP (!) 180/87 (BP Location: Right Arm)   Pulse 70   Temp 98.3 F (36.8 C) (Oral)   Resp 18   Ht 5\' 6"  (1.676 m)   Wt 97.1 kg (214 lb)   SpO2 97%   BMI 34.54 kg/m   Physical Exam  Constitutional: She is oriented to person, place, and time. She appears well-developed and well-nourished.  Non-toxic appearance.  HENT:  Head: Normocephalic.  Right Ear: Tympanic membrane and external ear normal.  Left Ear: Tympanic membrane and external ear normal.  Eyes: EOM and lids are normal. Pupils are equal, round, and reactive to light.  Neck: Normal range of motion. Neck supple. Carotid bruit is not present.  Cardiovascular: Normal rate, regular rhythm, normal heart sounds, intact distal pulses and normal pulses.  Pulmonary/Chest: Breath sounds normal. No respiratory distress.  Abdominal: Soft. Bowel sounds are normal. There is no tenderness. There is no guarding.  Musculoskeletal: Normal range of motion. She exhibits tenderness.  There is pain to palpation over the right hip area.  No hot joint appreciated.  There is some pain and spasm of the upper thigh on the right.  There is crepitus with attempted range of motion of the right knee, no effusion appreciated and no hot joint noted.  There is good range of motion of the right ankle and toes.  The dorsalis pedis pulses 2+.  Lymphadenopathy:       Head (right side): No submandibular adenopathy present.       Head (left side): No submandibular adenopathy present.    She has no cervical adenopathy.    Neurological: She is alert and oriented to person, place, and time. She has normal strength. No cranial nerve deficit or sensory deficit.  Skin: Skin is warm and dry.  Psychiatric: She has a normal mood and affect. Her speech is normal.  Nursing note and vitals reviewed.    ED Treatments / Results  Labs (all labs ordered are listed, but only abnormal results are displayed) Labs Reviewed - No data to display  EKG  EKG Interpretation None       Radiology No results found.  Procedures Procedures (  including critical care time)  Medications Ordered in ED Medications  dexamethasone (DECADRON) injection 10 mg (not administered)  ibuprofen (ADVIL,MOTRIN) tablet 400 mg (not administered)  cyclobenzaprine (FLEXERIL) tablet 10 mg (not administered)     Initial Impression / Assessment and Plan / ED Course  I have reviewed the triage vital signs and the nursing notes.  Pertinent labs & imaging results that were available during my care of the patient were reviewed by me and considered in my medical decision making (see chart for details).       Final Clinical Impressions(s) / ED Diagnoses MDM Blood pressure is elevated at 180/87.  Patient has a history of hypertension.  I have asked the patient to have her blood pressure rechecked soon.  I reviewed the previous x-rays of the right hip and pelvis.  Patient has extensive arthritis with loss of joint space in the right hip area.  The examination suggest some spasm involving the upper thigh and pain with attempted range of motion of the hip area. No gross neurovascular deficit noted. I suspect that the patient has strained and irritated the muscles of the thigh area in an attempt to alleviate some of the pain and pressure on the right hip.  The patient will be treated with an injection of Decadron here in the emergency department I will asked her to use ibuprofen 400 mg with meals she will also be given Flexeril 3 times daily.  I have  asked the patient to see Dr. Luna Glasgow for orthopedic management of this issue as soon as possible.  Patient is in agreement with this plan.   Final diagnoses:  Muscle strain  Primary osteoarthritis of right hip    ED Discharge Orders        Ordered    cyclobenzaprine (FLEXERIL) 10 MG tablet  3 times daily     06/03/17 1633    ibuprofen (ADVIL,MOTRIN) 400 MG tablet  Every 6 hours PRN     06/03/17 1633       Lily Kocher, PA-C 06/03/17 1633    Tanna Furry, MD 06/08/17 [phone]

## 2017-06-11 ENCOUNTER — Ambulatory Visit (HOSPITAL_COMMUNITY)
Admission: RE | Admit: 2017-06-11 | Discharge: 2017-06-11 | Disposition: A | Discharge status: Home / Self Care | Payer: Medicare Other | Source: Ambulatory Visit | Attending: Family Medicine | Admitting: Family Medicine

## 2017-06-11 ENCOUNTER — Other Ambulatory Visit: Payer: Self-pay | Admitting: Family Medicine

## 2017-06-11 DIAGNOSIS — N6312 Unspecified lump in the right breast, upper inner quadrant: Secondary | ICD-10-CM | POA: Diagnosis not present

## 2017-06-11 DIAGNOSIS — R928 Other abnormal and inconclusive findings on diagnostic imaging of breast: Secondary | ICD-10-CM | POA: Diagnosis not present

## 2017-06-11 DIAGNOSIS — N6314 Unspecified lump in the right breast, lower inner quadrant: Secondary | ICD-10-CM | POA: Insufficient documentation

## 2017-06-12 ENCOUNTER — Encounter: Payer: Self-pay | Admitting: Orthopaedic Surgery

## 2017-06-12 ENCOUNTER — Ambulatory Visit (INDEPENDENT_AMBULATORY_CARE_PROVIDER_SITE_OTHER): Payer: Medicare Other | Admitting: Orthopaedic Surgery

## 2017-06-12 VITALS — BP 146/92 | HR 73 | Ht 66.0 in | Wt 214.0 lb

## 2017-06-12 DIAGNOSIS — M25551 Pain in right hip: Secondary | ICD-10-CM | POA: Diagnosis not present

## 2017-06-12 NOTE — Progress Notes (Signed)
[name], female DOB:03-04-1956, 62 y.o. SFK:[id number]  Chief Complaint  Patient presents with  . Follow-up    Right Hip    HPI  Jeanette Compton is a 62 y.o. female who has right hip pain.  She is much better today. She is moving well. She has no new trauma. HPI  Body mass index is 34.54 kg/m.  ROS  Review of Systems  HENT: Negative for congestion.   Respiratory: Negative for cough and shortness of breath.   Cardiovascular: Negative for chest pain and leg swelling.  Endocrine: Positive for cold intolerance.  Musculoskeletal: Positive for arthralgias, gait problem and joint swelling.  Allergic/Immunologic: Positive for environmental allergies.  Neurological: Negative for numbness.  All other systems reviewed and are negative.   Past Medical History:  Diagnosis Date  . Arthritis    knee , ankle and back  . Bronchitis   . Cataract   . Hyperlipidemia   . Hypertension   . Neuralgia, post-herpetic 11/13/2016  . Thyroid disease     Past Surgical History:  Procedure Laterality Date  . ABDOMINAL HYSTERECTOMY     fibroid  . BREAST SURGERY    . COLONOSCOPY  09/13/2011   Procedure: COLONOSCOPY;  Surgeon: Rogene Houston, MD;  Location: [facility];  Service: Endoscopy;  Laterality: N/A;  1030    Family History  Problem Relation Age of Onset  . Stroke Mother   . Diabetes Mother   . Alcohol abuse Father   . Early death Father   . Early death Sister        pneumonia  . Cancer Brother   . Alzheimer's disease Sister   . COPD Brother   . Colon cancer Neg Hx     Social History Social History   Tobacco Use  . Smoking status: Former Smoker    Packs/day: 0.50    Years: 3.00    Pack years: 1.50  . Smokeless tobacco: Never Used  Substance Use Topics  . Alcohol use: No  . Drug use: No    Allergies  Allergen Reactions  . Lisinopril Swelling    Lips swelled  . Naproxen Itching and Other (See Comments)    "hurts my stomach"    Current  Outpatient Medications  Medication Sig Dispense Refill  . aspirin EC 81 MG tablet Take 81 mg by mouth at bedtime.    . cyclobenzaprine (FLEXERIL) 10 MG tablet Take 1 tablet (10 mg total) by mouth 3 (three) times daily. 20 tablet 0  . HYDROcodone-acetaminophen (NORCO/VICODIN) 5-325 MG tablet TAKE (1) TABLET BY MOUTH EVERY 6 HOURS AS NEEDED FOR MODERATE PAIN. MUST LAST 30 DAYS. 75 tablet 0  . ibuprofen (ADVIL,MOTRIN) 400 MG tablet Take 1 tablet (400 mg total) by mouth every 6 (six) hours as needed. 30 tablet 0  . losartan-hydrochlorothiazide (HYZAAR) 50-12.5 MG tablet Take 1 tablet by mouth daily. 90 tablet 3  . Multiple Vitamin (MULTIVITAMIN WITH MINERALS) TABS tablet Take 1 tablet by mouth daily. One-A-Day    . nabumetone (RELAFEN) 750 MG tablet Take 1 tablet (750 mg total) by mouth 2 (two) times daily. One by mouth twice a day after eating. 60 tablet 5  . pravastatin (PRAVACHOL) 40 MG tablet Take 1 tablet (40 mg total) by mouth daily. (Patient taking differently: Take 40 mg by mouth at bedtime. ) 30 tablet 11   No current facility-administered medications for this visit.      Physical Exam  Blood pressure (!) 146/92, pulse 73, height  5\' 6"  (1.676 m), weight 214 lb (97.1 kg).  Constitutional: overall normal hygiene, normal nutrition, well developed, normal grooming, normal body habitus. Assistive device:none  Musculoskeletal: gait and station Limp none, muscle tone and strength are normal, no tremors or atrophy is present.  .  Neurological: coordination overall normal.  Deep tendon reflex/nerve stretch intact.  Sensation normal.  Cranial nerves II-XII intact.   Skin:   Normal overall no scars, lesions, ulcers or rashes. No psoriasis.  Psychiatric: Alert and oriented x 3.  Recent memory intact, remote memory unclear.  Normal mood and affect. Well groomed.  Good eye contact.  Cardiovascular: overall no swelling, no varicosities, no edema bilaterally, normal temperatures of the legs and  arms, no clubbing, cyanosis and good capillary refill.  Lymphatic: palpation is normal.  Right hip with no pain, full ROM, normal gait.  All other systems reviewed and are negative   The patient has been educated about the nature of the problem(s) and counseled on treatment options.  The patient appeared to understand what I have discussed and is in agreement with it.  Encounter Diagnosis  Name Primary?  . Right hip pain Yes    PLAN Call if any problems.  Precautions discussed.  Continue current medications.   Return to clinic PRN   Electronically Signed Sanjuana Kava, MD 1/23/201910:14 AM

## 2017-06-18 ENCOUNTER — Other Ambulatory Visit: Payer: Self-pay | Admitting: Family Medicine

## 2017-06-18 ENCOUNTER — Ambulatory Visit (HOSPITAL_COMMUNITY)
Admission: RE | Admit: 2017-06-18 | Discharge: 2017-06-18 | Disposition: A | Discharge status: Home / Self Care | Payer: Medicare Other | Source: Ambulatory Visit | Attending: Family Medicine | Admitting: Family Medicine

## 2017-06-18 DIAGNOSIS — IMO0002 Reserved for concepts with insufficient information to code with codable children: Secondary | ICD-10-CM

## 2017-06-18 DIAGNOSIS — R928 Other abnormal and inconclusive findings on diagnostic imaging of breast: Secondary | ICD-10-CM

## 2017-06-18 DIAGNOSIS — D241 Benign neoplasm of right breast: Secondary | ICD-10-CM | POA: Insufficient documentation

## 2017-06-18 DIAGNOSIS — N6312 Unspecified lump in the right breast, upper inner quadrant: Secondary | ICD-10-CM | POA: Diagnosis not present

## 2017-06-18 DIAGNOSIS — N6314 Unspecified lump in the right breast, lower inner quadrant: Secondary | ICD-10-CM | POA: Diagnosis not present

## 2017-06-18 DIAGNOSIS — R229 Localized swelling, mass and lump, unspecified: Principal | ICD-10-CM

## 2017-06-18 MED ORDER — LIDOCAINE-EPINEPHRINE (PF) 1 %-1:200000 IJ SOLN
INTRAMUSCULAR | Status: AC
  Administered 2017-06-18: 7 mL
  Filled 2017-06-18: qty 30

## 2017-06-18 MED ORDER — LIDOCAINE HCL (PF) 1 % IJ SOLN
INTRAMUSCULAR | Status: AC
  Administered 2017-06-18: 5 mL
  Filled 2017-06-18: qty 5

## 2017-06-19 ENCOUNTER — Telehealth: Payer: Self-pay

## 2017-06-19 NOTE — Telephone Encounter (Signed)
Joann from Radiology called and needs to know if she can call Ms Reich for additional scheduling due to Introductal Papilloma.  Per Dr Meda Coffee ok for Pricilla Holm to call patient directly.  Let  Dr Meda Coffee know if you need anything else ordered by her.

## 2017-07-03 ENCOUNTER — Telehealth: Payer: Self-pay | Admitting: Orthopaedic Surgery

## 2017-07-03 MED ORDER — HYDROCODONE-ACETAMINOPHEN 5-325 MG PO TABS: ORAL_TABLET | ORAL | 0 refills | Status: DC

## 2017-07-03 NOTE — Telephone Encounter (Signed)
Patient called for refill:  Disp Refills Start End   HYDROcodone-acetaminophen (NORCO/VICODIN) 5-325 MG        - [place]   - patient has no follow up appointment - last seen 06/12/17. Please advise.

## 2017-07-09 ENCOUNTER — Encounter: Payer: Self-pay | Admitting: General Surgery

## 2017-07-09 ENCOUNTER — Ambulatory Visit (INDEPENDENT_AMBULATORY_CARE_PROVIDER_SITE_OTHER): Payer: Medicare Other | Admitting: General Surgery

## 2017-07-09 ENCOUNTER — Other Ambulatory Visit: Payer: Self-pay | Admitting: General Surgery

## 2017-07-09 VITALS — BP 171/83 | HR 63 | Temp 98.7°F | Resp 18 | Ht 66.0 in | Wt 218.0 lb

## 2017-07-09 DIAGNOSIS — D241 Benign neoplasm of right breast: Secondary | ICD-10-CM | POA: Diagnosis not present

## 2017-07-09 NOTE — H&P (Signed)
[facility] History and Physical  Reason for Referral:Intraductal papilloma of the right breast  Referring Physician: Dr. Shelly Bombard, Dr. Meda Coffee      Chief Complaint    breast papilloma      Jeanette Compton is a 62 y.o. female.  HPI: Ms. Stave presents after a screening mammogram demonstrated a concerning lesion on the right breast. She underwent a US guided biopsy and this demonstrated a intraductal papilloma. She has no other breast complaints. The patient has no history of any masses, lumps, bumps, nipple changes or discharge. She had menarche at age 22, and her first pregnancy at age 83. She is G3P3. She did not breastfeed her children.  She has no history of any family breast cancer. She had a hystertectomy.  She has had a previous biopsy in 2014 that showed a fibroadenoma of the right breast but no atypia. She has not had any chest radiation.       Past Medical History:  Diagnosis Date  . Arthritis    knee , ankle and back  . Bronchitis   . Cataract   . Hyperlipidemia   . Hypertension   . Neuralgia, post-herpetic 11/13/2016  . Thyroid disease          Past Surgical History:  Procedure Laterality Date  . ABDOMINAL HYSTERECTOMY     fibroid  . BREAST SURGERY    . COLONOSCOPY  09/13/2011   Procedure: COLONOSCOPY;  Surgeon: Rogene Houston, MD;  Location: [facility];  Service: Endoscopy;  Laterality: N/A;  1030         Family History  Problem Relation Age of Onset  . Stroke Mother   . Diabetes Mother   . Alcohol abuse Father   . Early death Father   . Early death Sister        pneumonia  . Cancer Brother   . Alzheimer's disease Sister   . COPD Brother   . Colon cancer Neg Hx     Social History        Tobacco Use  . Smoking status: Former Smoker    Packs/day: 0.50    Years: 3.00    Pack years: 1.50  . Smokeless tobacco: Never Used  Substance Use Topics  . Alcohol use: No  . Drug use: No     Medications: I have reviewed the patient's current medications.        Current Outpatient Medications  Medication Sig Dispense Refill Last Dose  . aspirin EC 81 MG tablet Take 81 mg by mouth at bedtime.   Taking  . cyclobenzaprine (FLEXERIL) 10 MG tablet Take 1 tablet (10 mg total) by mouth 3 (three) times daily. 20 tablet 0   . HYDROcodone-acetaminophen (NORCO/VICODIN) 5-325 MG tablet TAKE (1) TABLET BY MOUTH EVERY 6 HOURS AS NEEDED FOR MODERATE PAIN. MUST LAST 30 DAYS. 75 tablet 0   . ibuprofen (ADVIL,MOTRIN) 400 MG tablet Take 1 tablet (400 mg total) by mouth every 6 (six) hours as needed. 30 tablet 0   . losartan-hydrochlorothiazide (HYZAAR) 50-12.5 MG tablet Take 1 tablet by mouth daily. 90 tablet 3 Taking  . Multiple Vitamin (MULTIVITAMIN WITH MINERALS) TABS tablet Take 1 tablet by mouth daily. One-A-Day   Taking  . nabumetone (RELAFEN) 750 MG tablet Take 1 tablet (750 mg total) by mouth 2 (two) times daily. One by mouth twice a day after eating. 60 tablet 5 Taking  . pravastatin (PRAVACHOL) 40 MG tablet Take 1 tablet (40 mg total) by mouth  daily. (Patient taking differently: Take 40 mg by mouth at bedtime. ) 30 tablet 11 Taking   No current facility-administered medications for this visit.        Allergies as of 07/09/2017      Reactions   Lisinopril Swelling   Lips swelled   Naproxen Itching, Other (See Comments)   "hurts my stomach"                  Medication List             Accurate as of 07/09/17 11:34 AM. Always use your most recent med list.           aspirin EC 81 MG tablet Take 81 mg by mouth at bedtime.   cyclobenzaprine 10 MG tablet Commonly known as:  FLEXERIL Take 1 tablet (10 mg total) by mouth 3 (three) times daily.   HYDROcodone-acetaminophen 5-325 MG tablet Commonly known as:  NORCO/VICODIN TAKE (1) TABLET BY MOUTH EVERY 6 HOURS AS NEEDED FOR MODERATE PAIN. MUST LAST 30 DAYS.   ibuprofen 400 MG tablet Commonly known  as:  ADVIL,MOTRIN Take 1 tablet (400 mg total) by mouth every 6 (six) hours as needed.   losartan-hydrochlorothiazide 50-12.5 MG tablet Commonly known as:  HYZAAR Take 1 tablet by mouth daily.   multivitamin with minerals Tabs tablet Take 1 tablet by mouth daily. One-A-Day   nabumetone 750 MG tablet Commonly known as:  RELAFEN Take 1 tablet (750 mg total) by mouth 2 (two) times daily. One by mouth twice a day after eating.   pravastatin 40 MG tablet Commonly known as:  PRAVACHOL Take 1 tablet (40 mg total) by mouth daily.        ROS:  A comprehensive review of systems was negative except for: Respiratory: positive for wheezing Musculoskeletal: positive for back pain and arthritis  Blood pressure (!) 171/83, pulse 63, temperature 98.7 F (37.1 C), resp. rate 18, height 5\' 6"  (1.676 m), weight 218 lb (98.9 kg). Physical Exam  Constitutional: She is oriented to person, place, and time and well-developed, well-nourished, and in no distress.  HENT:  Head: Normocephalic.  Eyes: Pupils are equal, round, and reactive to light.  Neck: Normal range of motion.  Cardiovascular: Normal rate and regular rhythm.  Pulmonary/Chest: Effort normal and breath sounds normal.  Right and left breast without mass, no nipple drainage or retraction, no axillary adenopathy  Abdominal: Soft. She exhibits no distension. There is no tenderness.  Musculoskeletal: Normal range of motion.  Neurological: She is alert and oriented to person, place, and time.  Skin: Skin is warm and dry.  Psychiatric: Mood, memory, affect and judgment normal.  Vitals reviewed.   Results: Screening Mammogram 06/11/2017 IMPRESSION: 1. In the right breast at 3 o'clock, there is an indeterminate mass measuring 6 mm. Though this has benign features, this has increased in size compared to prior mammograms.  2. No evidence of right axillary lymphadenopathy.  RECOMMENDATION: Ultrasound-guided biopsy is  recommended for the right breast mass. This has been scheduled for 06/18/2017 at 1 p.m.  I have discussed the findings and recommendations with the patient. Results were also provided in writing at the conclusion of the visit. If applicable, a reminder letter will be sent to the patient regarding the next appointment.  BI-RADS CATEGORY 4: Suspicious.  US guided Biopsy 06/18/2017- CLINICAL DATA: Post ultrasound-guided biopsy of a mass in the right breast at the 3 o'clock position.  EXAM: DIAGNOSTIC RIGHT MAMMOGRAM POST ULTRASOUND BIOPSY  COMPARISON: Previous  exam(s).  FINDINGS: Mammographic images were obtained following ultrasound guided biopsy of a mass in the lower inner right breast at the approximate 3:00 position. A wing shaped biopsy marking clip is present at the site of the biopsied mass in the lower inner right breast.  IMPRESSION: Wing shaped biopsy marking clip appropriately positioned at site of biopsied mass in the lower inner right breast.  Final Assessment: Post Procedure Mammograms for Marker Placement  Pathology: Diagnosis Breast, right, needle core biopsy, 3:00 - INTRADUCTAL PAPILLOMA. - NO MALIGNANCY IDENTIFIED.  ADDENDUM REPORT: 06/25/2017 13:38  ADDENDUM: Pathology of the right breast biopsy revealed an intraductal papilloma. No malignancy identified.  This was found to be concordant by Dr. Shelly Bombard.  Recommendation: Surgical referral.  At the patient's request, results and recommendations were relayed to the patient by phone by Jetta Lout, [place] on 06/19/17. The patient stated she did well following the biopsy with no bleeding or bruising. Post biopsy instructions were reviewed with the patient. All of her questions were answered. She was encouraged to contact the imaging department of [facility] with any further questions or concerns. A referral was made for the patient to [facility] for 07/09/17/at  11:15 AM by Jetta Lout, RRA. Appointment information was relayed to the patient by phone on 06/25/17.  Addendum by Jetta Lout, RRA on 06/25/17.  Breast Cancer Risk Calculator- 1.5% average risk of cancer in 5 years; 7.4% risk of lifetime cancer, just slightly above average risk (7.1%)  Assessment & Plan:  Jeanette Compton is a 62 y.o. female with an intraductal papilloma of the right breast. We have discussed that with a US guided biopsy that only part of the area is biopsied and that there could be other pathology that is cancerous, precancerous, atypical in the region that would change her management. Given this we would recommend either a surgical excisional biopsy versus close surveillance mammogram/ [place] if she is unwilling to undergo surgery or unable to undergo surgery.    She wants to proceed with the biopsy and make and does not want to undergo more aggressive surveillance.  We have discussed the risk and benefits of breast biopsy including, infection, bleeding, contour changes to the breast, seroma formation to help the contour and possible need for additional surgery pending the pathology. She understands and wants to proceed with excisional biopsy following needle localization.   All questions were answered to the satisfaction of the patient.   Virl Cagey 07/09/2017, 11:34 AM

## 2017-07-09 NOTE — Patient Instructions (Addendum)
Intraductal Papilloma, benign breast tissue on the US guided biopsy. Will need a surgical biospy of the area to prove no precancerous, cancerous, or other concerning finding.   Breast Cancer Risk Calculator- 1.5% average risk of cancer in 5 years; 7.4% risk of lifetime cancer, just slightly above average risk (7.1%) Breast Biopsy A breast biopsy is a procedure in which a sample of suspicious breast tissue is removed from your breast. Following the procedure, the tissue or liquid that is removed from the breast is examined under a microscope to see if cancerous cells are present. You may need a breast biopsy if you have:  Any undiagnosed breast mass (tumor).  Nipple abnormalities, dimpling, crusting, or ulcerations.  Abnormal discharge from the nipple, especially blood.  Redness, swelling, and pain of the breast.  Calcium deposits (calcifications) or abnormalities seen on a mammogram, ultrasound results, or MRI results.  Suspicious changes in the breast seen on your mammogram.  If the breast abnormality is found to be cancerous (malignant), a breast biopsy can help to determine what the best treatment is for you. There are many different types of breast biopsies. Talk with your health care provider about your options and which type is best for you. Tell a health care provider about:  Any allergies you have.  All medicines you are taking, including vitamins, herbs, eye drops, creams, and over-the-counter medicines.  Any problems you or family members have had with anesthetic medicines.  Any blood disorders you have.  Any surgeries you have had.  Any medical conditions you have.  Whether you are pregnant or may be pregnant. What are the risks? Generally, this is a safe procedure. However, problems may occur, including:  Bleeding.  Infection.  Discomfort. This is temporary.  Allergic reactions to medicines.  Bruising and swelling of the breast.  Alteration in the shape of  the breast.  Damage to other tissues.  Not finding the lump or abnormality.  Needing more surgery.  What happens before the procedure?  Plan to have someone take you home after the procedure.  Do not use any tobacco products, such as cigarettes, chewing tobacco, and e-cigarettes. If you need help quitting, ask your health care provider.  Do not drink alcohol for 24 hours before the procedure.  Ask your health care provider about: ? Changing or stopping your regular medicines. This is especially important if you are taking diabetes medicines or blood thinners. ? Taking medicines such as aspirin and ibuprofen. These medicines can thin your blood. Do not take these medicines before your procedure if your health care provider instructs you not to.  Wear a good support bra to the procedure.  Ask your health care provider how your surgical site will be marked or identified.  You may be given antibiotic medicine to help prevent infection.  Your health care provider may perform a procedure to place a wire (needle localization) or a seed that gives off radiation (radioactive seed localization) in the breast lump. A mammogram, ultrasound, MRI, or a combination of these techniques will be done during this procedure to identify the location of the breast abnormality. The imaging technique used will depend on the type of biopsy you are having. The wire or seed will help the health care provider locate the lump when performing the biopsy, especially if the lump cannot be felt.  Surgical Biopsy This method requires an incision in the breast to remove part or all of the suspicious tissue. After the tissue is removed, the skin over  the area will be closed with sutures and covered with a dressing. There are two types of surgical biopsies:  Incisional biopsy. The surgeon will remove part of the breast lump.  Excisional biopsy. The surgeon will attempt to remove the whole breast lump or as much of it as  possible.  After any of these procedures, the tissue or liquid that was removed will be examined under a microscope. What happens after the procedure?  You will be taken to the recovery area. If you are doing well and have no problems, you will be allowed to go home.  You may notice bruising on your breast. This is normal.  You may have a pressure dressing applied on your breast for 24-48 hours. A pressure dressing is a bandage that is wrapped tightly around the chest to stop fluid from collecting underneath tissues. You may also be advised to wear a supportive bra during this time.  Do not drive for 24 hours if you received a sedative. This information is not intended to replace advice given to you by your health care provider. Make sure you discuss any questions you have with your health care provider. Document Released: 05/07/2005 Document Revised: 09/15/2015 Document Reviewed: 02/08/2015 Elsevier Interactive Patient Education  [employer].

## 2017-07-09 NOTE — Progress Notes (Signed)
[facility] History and Physical  Reason for Referral:Intraductal papilloma of the right breast  Referring Physician: Dr. Shelly Bombard, Dr. Meda Coffee   Chief Complaint    breast papilloma      Jeanette Compton is a 62 y.o. female.  HPI: Jeanette Compton presents after a screening mammogram demonstrated a concerning lesion on the right breast. She underwent a US guided biopsy and this demonstrated a intraductal papilloma. She has no other breast complaints. The patient has no history of any masses, lumps, bumps, nipple changes or discharge. She had menarche at age 56, and her first pregnancy at age 56. She is G3P3. She did not breastfeed her children.  She has no history of any family breast cancer. She had a hystertectomy.  She has had a previous biopsy in 2014 that showed a fibroadenoma of the right breast but no atypia. She has not had any chest radiation.   Past Medical History:  Diagnosis Date  . Arthritis    knee , ankle and back  . Bronchitis   . Cataract   . Hyperlipidemia   . Hypertension   . Neuralgia, post-herpetic 11/13/2016  . Thyroid disease     Past Surgical History:  Procedure Laterality Date  . ABDOMINAL HYSTERECTOMY     fibroid  . BREAST SURGERY    . COLONOSCOPY  09/13/2011   Procedure: COLONOSCOPY;  Surgeon: Rogene Houston, MD;  Location: [facility];  Service: Endoscopy;  Laterality: N/A;  1030    Family History  Problem Relation Age of Onset  . Stroke Mother   . Diabetes Mother   . Alcohol abuse Father   . Early death Father   . Early death Sister        pneumonia  . Cancer Brother   . Alzheimer's disease Sister   . COPD Brother   . Colon cancer Neg Hx     Social History   Tobacco Use  . Smoking status: Former Smoker    Packs/day: 0.50    Years: 3.00    Pack years: 1.50  . Smokeless tobacco: Never Used  Substance Use Topics  . Alcohol use: No  . Drug use: No    Medications: I have reviewed the patient's current medications. Current  Outpatient Medications  Medication Sig Dispense Refill Last Dose  . aspirin EC 81 MG tablet Take 81 mg by mouth at bedtime.   Taking  . cyclobenzaprine (FLEXERIL) 10 MG tablet Take 1 tablet (10 mg total) by mouth 3 (three) times daily. 20 tablet 0   . HYDROcodone-acetaminophen (NORCO/VICODIN) 5-325 MG tablet TAKE (1) TABLET BY MOUTH EVERY 6 HOURS AS NEEDED FOR MODERATE PAIN. MUST LAST 30 DAYS. 75 tablet 0   . ibuprofen (ADVIL,MOTRIN) 400 MG tablet Take 1 tablet (400 mg total) by mouth every 6 (six) hours as needed. 30 tablet 0   . losartan-hydrochlorothiazide (HYZAAR) 50-12.5 MG tablet Take 1 tablet by mouth daily. 90 tablet 3 Taking  . Multiple Vitamin (MULTIVITAMIN WITH MINERALS) TABS tablet Take 1 tablet by mouth daily. One-A-Day   Taking  . nabumetone (RELAFEN) 750 MG tablet Take 1 tablet (750 mg total) by mouth 2 (two) times daily. One by mouth twice a day after eating. 60 tablet 5 Taking  . pravastatin (PRAVACHOL) 40 MG tablet Take 1 tablet (40 mg total) by mouth daily. (Patient taking differently: Take 40 mg by mouth at bedtime. ) 30 tablet 11 Taking   No current facility-administered medications for this visit.    Allergies  as of 07/09/2017      Reactions   Lisinopril Swelling   Lips swelled   Naproxen Itching, Other (See Comments)   "hurts my stomach"      Medication List        Accurate as of 07/09/17 11:34 AM. Always use your most recent med list.          aspirin EC 81 MG tablet Take 81 mg by mouth at bedtime.   cyclobenzaprine 10 MG tablet Commonly known as:  FLEXERIL Take 1 tablet (10 mg total) by mouth 3 (three) times daily.   HYDROcodone-acetaminophen 5-325 MG tablet Commonly known as:  NORCO/VICODIN TAKE (1) TABLET BY MOUTH EVERY 6 HOURS AS NEEDED FOR MODERATE PAIN. MUST LAST 30 DAYS.   ibuprofen 400 MG tablet Commonly known as:  ADVIL,MOTRIN Take 1 tablet (400 mg total) by mouth every 6 (six) hours as needed.   losartan-hydrochlorothiazide 50-12.5 MG  tablet Commonly known as:  HYZAAR Take 1 tablet by mouth daily.   multivitamin with minerals Tabs tablet Take 1 tablet by mouth daily. One-A-Day   nabumetone 750 MG tablet Commonly known as:  RELAFEN Take 1 tablet (750 mg total) by mouth 2 (two) times daily. One by mouth twice a day after eating.   pravastatin 40 MG tablet Commonly known as:  PRAVACHOL Take 1 tablet (40 mg total) by mouth daily.        ROS:  A comprehensive review of systems was negative except for: Respiratory: positive for wheezing Musculoskeletal: positive for back pain and arthritis  Blood pressure (!) 171/83, pulse 63, temperature 98.7 F (37.1 C), resp. rate 18, height 5\' 6"  (1.676 m), weight 218 lb (98.9 kg). Physical Exam  Constitutional: She is oriented to person, place, and time and well-developed, well-nourished, and in no distress.  HENT:  Head: Normocephalic.  Eyes: Pupils are equal, round, and reactive to light.  Neck: Normal range of motion.  Cardiovascular: Normal rate and regular rhythm.  Pulmonary/Chest: Effort normal and breath sounds normal.  Right and left breast without mass, no nipple drainage or retraction, no axillary adenopathy  Abdominal: Soft. She exhibits no distension. There is no tenderness.  Musculoskeletal: Normal range of motion.  Neurological: She is alert and oriented to person, place, and time.  Skin: Skin is warm and dry.  Psychiatric: Mood, memory, affect and judgment normal.  Vitals reviewed.   Results: Screening Mammogram 06/11/2017 IMPRESSION: 1. In the right breast at 3 o'clock, there is an indeterminate mass measuring 6 mm. Though this has benign features, this has increased in size compared to prior mammograms.  2.  No evidence of right axillary lymphadenopathy.  RECOMMENDATION: Ultrasound-guided biopsy is recommended for the right breast mass. This has been scheduled for 06/18/2017 at 1 p.m.  I have discussed the findings and recommendations with  the patient. Results were also provided in writing at the conclusion of the visit. If applicable, a reminder letter will be sent to the patient regarding the next appointment.  BI-RADS CATEGORY  4: Suspicious.  US guided Biopsy 06/18/2017- CLINICAL DATA:  Post ultrasound-guided biopsy of a mass in the right breast at the 3 o'clock position.  EXAM: DIAGNOSTIC RIGHT MAMMOGRAM POST ULTRASOUND BIOPSY  COMPARISON:  Previous exam(s).  FINDINGS: Mammographic images were obtained following ultrasound guided biopsy of a mass in the lower inner right breast at the approximate 3:00 position. A wing shaped biopsy marking clip is present at the site of the biopsied mass in the lower inner right breast.  IMPRESSION: Wing shaped biopsy marking clip appropriately positioned at site of biopsied mass in the lower inner right breast.  Final Assessment: Post Procedure Mammograms for Marker Placement  Pathology: Diagnosis Breast, right, needle core biopsy, 3:00 - INTRADUCTAL PAPILLOMA. - NO MALIGNANCY IDENTIFIED.  ADDENDUM REPORT: 06/25/2017 13:38  ADDENDUM: Pathology of the right breast biopsy revealed an intraductal papilloma. No malignancy identified.  This was found to be concordant by Dr. Shelly Bombard.  Recommendation: Surgical referral.  At the patient's request, results and recommendations were relayed to the patient by phone by Jetta Lout, [place] on 06/19/17. The patient stated she did well following the biopsy with no bleeding or bruising. Post biopsy instructions were reviewed with the patient. All of her questions were answered. She was encouraged to contact the imaging department of [facility] with any further questions or concerns. A referral was made for the patient to [facility] for 07/09/17/at 11:15 AM by Jetta Lout, RRA. Appointment information was relayed to the patient by phone on 06/25/17.  Addendum by Jetta Lout, RRA on  06/25/17.  Breast Cancer Risk Calculator- 1.5% average risk of cancer in 5 years; 7.4% risk of lifetime cancer, just slightly above average risk (7.1%)  Assessment & Plan:  Jeanette Compton is a 62 y.o. female with an intraductal papilloma of the right breast. We have discussed that with a US guided biopsy that only part of the area is biopsied and that there could be other pathology that is cancerous, precancerous, atypical in the region that would change her management. Given this we would recommend either a surgical excisional biopsy versus close surveillance mammogram/ [place] if she is unwilling to undergo surgery or unable to undergo surgery.    She wants to proceed with the biopsy and make and does not want to undergo more aggressive surveillance.  We have discussed the risk and benefits of breast biopsy including, infection, bleeding, contour changes to the breast, seroma formation to help the contour and possible need for additional surgery pending the pathology. She understands and wants to proceed with excisional biopsy following needle localization.   All questions were answered to the satisfaction of the patient.   Virl Cagey 07/09/2017, 11:34 AM

## 2017-07-24 NOTE — Patient Instructions (Signed)
Jeanette Compton  07/24/2017     @PREFPERIOPPHARMACY @   Your procedure is scheduled on  07/31/2017 .  Report to [facility]  Call this number if you have problems the morning of surgery:  [phone]   Remember:  Do not eat food or drink liquids after midnight.  Take these medicines the morning of surgery with A SIP OF WATER  Flexaril, hydrocodone, losartan.   Do not wear jewelry, make-up or nail polish.  Do not wear lotions, powders, or perfumes, or deodorant.  Do not shave 48 hours prior to surgery.  Men may shave face and neck.  Do not bring valuables to the hospital.  [facility] is not responsible for any belongings or valuables.  Contacts, dentures or bridgework may not be worn into surgery.  Leave your suitcase in the car.  After surgery it may be brought to your room.  For patients admitted to the hospital, discharge time will be determined by your treatment team.  Patients discharged the day of surgery will not be allowed to drive home.   Name and phone number of your driver:   family Special instructions:  None  Please read over the following fact sheets that you were given. Anesthesia Post-op Instructions and Care and Recovery After Surgery       Breast Biopsy A breast biopsy is a test during which a sample of tissue is taken from your breast. The breast tissue is looked at under a microscope for cancer cells. What happens before the procedure?  Plan to have someone take you home after the test.  Do not use tobacco products. These include cigarettes, chewing tobacco, or e-cigarettes. If you need help quitting, ask your doctor.  Do not drink alcohol for 24 hours before the test.  Ask your doctor about: ? Changing or stopping your normal medicines. This is important if you take diabetes medicines or blood thinners. ? Taking medicines such as aspirin and ibuprofen. These medicines can thin your blood. Do not take these medicines  before your procedure if your doctor tells you not to.  Wear a good support bra to the test.  Ask your doctor how your surgical site will be marked or identified.  You may be given antibiotic medicine to help prevent infection.  You may be checked for extra fluid in your body (lymphedema).  Your doctor may place a wire or a seed in the lump. The wire or seed gives off radiation. This will help your doctor to see the lump during the biopsy. What happens during the procedure? You may be given the following:  A medicine to numb the breast area (local anesthetic).  A medicine to help you relax (sedative).  There are different types of breast biopsies. Each type is described below. Fine-Needle Aspiration  A needle will be put into the breast lump.  The needle will take out fluid and cells from the lump. Core-Needle Biopsy  A needle will be put into the breast lump.  The needle will be put into your breast several times.  The needle will remove breast tissue. Stereotactic Biopsy  You will lie on a table on your belly. Your breast will pass through a hole in the table. Your breast will be held in place.  X-rays and a computer will be used to locate the breast lump.  A needle will be used to remove tissue samples from your  breast. Vacuum-Assisted Biopsy  A small cut (incision) will be made in your breast.  A biopsy device will be put through the cut and into the breast tissue.  The biopsy device will draw abnormal breast tissue into the biopsy device.  A large tissue sample will often be removed.  No stitches will be needed. Ultrasound-Guided Core-Needle Biopsy  Ultrasound imaging will help guide the needle into the area of the breast that is not normal.  A cut will be made in the breast. The needle will be put into the breast lump.  Tissue samples will be taken out. Surgical Biopsy  A cut will be made in the breast to remove tissue.  The cut will be closed with  stitches and covered with a bandage.  There are two types: ? Incisional biopsy. Your doctor will remove part of the breast lump. ? Excisional biopsy. Your doctor will try to remove the whole breast lump or as much as possible. All tissue or fluid samples will be looked at under a microscope. What happens after the procedure?  You will be able to go home when you are doing well and you are not having problems.  You may have bruising on your breast. This is normal.  A pressure bandage (dressing) may be put on your breast. It may be left on for 24-48 hours. This type of bandage is wrapped tightly around your chest. It helps to stop fluid from building up under tissues. You may also need to wear a supportive bra during this time.  Do not drive for 24 hours if you received a sedative. This information is not intended to replace advice given to you by your health care provider. Make sure you discuss any questions you have with your health care provider. Document Released: 07/30/2011 Document Revised: 01/12/2016 Document Reviewed: 02/08/2015 Elsevier Interactive Patient Education  2018 [employer].  Breast Biopsy, Care After These instructions give you information about caring for yourself after your procedure. Your doctor may also give you more specific instructions. Call your doctor if you have any problems or questions after your procedure. Follow these instructions at home: Medicines  Take over-the-counter and prescription medicines only as told by your doctor.  Do not drive for 24 hours if you received a sedative.  Do not drink alcohol while taking pain medicine.  Do not drive or use heavy machinery while taking prescription pain medicine. Biopsy Site Care   Follow instructions from your doctor about how to take care of your cut from surgery (incision) or puncture area. Make sure you: ? Wash your hands with soap and water before you change your bandage. If you cannot use soap and  water, use hand sanitizer. ? Change any bandages (dressings) as told by your doctor. ? Leave any stitches (sutures), skin glue, or skin tape (adhesive) strips in place. They may need to stay in place for 2 weeks or longer. If tape strips get loose and curl up, you may trim the loose edges. Do not remove tape strips completely unless your doctor says it is okay.  If you have stitches, keep them dry when you take a bath or a shower.  Check your cut or puncture area every day for signs of infection. Check for: ? More redness, swelling, or pain. ? More fluid or blood. ? Warmth. ? Pus or a bad smell.  Protect the biopsy area. Do not let the area get bumped. Activity  Avoid activities that could pull the biopsy site open. ?  Avoid stretching. ? Avoid reaching. ? Avoid exercise. ? Avoid sports. ? Avoid lifting anything that is heavier than 3 pounds (1.4 kg).  Return to your normal activities as told by your doctor. Ask your doctor what activities are safe for you. General instructions  Continue your normal diet.  Wear a good support bra for as long as told by your doctor.  Get checked for extra fluid in your body (lymphedema) as often as told by your doctor.  Keep all follow-up visits as told by your doctor. This is important. Contact a health care provider if:  You have more redness, swelling, or pain at the biopsy site.  You have more fluid or blood coming from your biopsy site.  Your biopsy site feels warm to the touch.  You have pus or a bad smell coming from the biopsy site.  Your biopsy site breaks open after the stitches, staples, or skin tape strips have been removed.  You have a rash.  You have a fever. Get help right away if:  You have more bleeding (more than a small spot) from the biopsy site.  You have trouble breathing.  You have red streaks around the biopsy site. This information is not intended to replace advice given to you by your health care provider.  Make sure you discuss any questions you have with your health care provider. Document Released: 03/03/2009 Document Revised: 01/12/2016 Document Reviewed: 02/08/2015 Elsevier Interactive Patient Education  2018 [place] Anesthesia, Adult General anesthesia is the use of medicines to make a person "go to sleep" (be unconscious) for a medical procedure. General anesthesia is often recommended when a procedure:  Is long.  Requires you to be still or in an unusual position.  Is major and can cause you to lose blood.  Is impossible to do without general anesthesia.  The medicines used for general anesthesia are called general anesthetics. In addition to making you sleep, the medicines:  Prevent pain.  Control your blood pressure.  Relax your muscles.  Tell a health care provider about:  Any allergies you have.  All medicines you are taking, including vitamins, herbs, eye drops, creams, and over-the-counter medicines.  Any problems you or family members have had with anesthetic medicines.  Types of anesthetics you have had in the past.  Any bleeding disorders you have.  Any surgeries you have had.  Any medical conditions you have.  Any history of heart or lung conditions, such as heart failure, sleep apnea, or chronic obstructive pulmonary disease (COPD).  Whether you are pregnant or may be pregnant.  Whether you use tobacco, alcohol, marijuana, or street drugs.  Any history of Armed forces logistics/support/administrative officer.  Any history of depression or anxiety. What are the risks? Generally, this is a safe procedure. However, problems may occur, including:  Allergic reaction to anesthetics.  Lung and heart problems.  Inhaling food or liquids from your stomach into your lungs (aspiration).  Injury to nerves.  Waking up during your procedure and being unable to move (rare).  Extreme agitation or a state of mental confusion (delirium) when you wake up from the  anesthetic.  Air in the bloodstream, which can lead to stroke.  These problems are more likely to develop if you are having a major surgery or if you have an advanced medical condition. You can prevent some of these complications by answering all of your health care provider's questions thoroughly and by following all pre-procedure instructions. General anesthesia can cause side effects,  including:  Nausea or vomiting  A sore throat from the breathing tube.  Feeling cold or shivery.  Feeling tired, washed out, or achy.  Sleepiness or drowsiness.  Confusion or agitation.  What happens before the procedure? Staying hydrated Follow instructions from your health care provider about hydration, which may include:  Up to 2 hours before the procedure - you may continue to drink clear liquids, such as water, clear fruit juice, black coffee, and plain tea.  Eating and drinking restrictions Follow instructions from your health care provider about eating and drinking, which may include:  8 hours before the procedure - stop eating heavy meals or foods such as meat, fried foods, or fatty foods.  6 hours before the procedure - stop eating light meals or foods, such as toast or cereal.  6 hours before the procedure - stop drinking milk or drinks that contain milk.  2 hours before the procedure - stop drinking clear liquids.  Medicines  Ask your health care provider about: ? Changing or stopping your regular medicines. This is especially important if you are taking diabetes medicines or blood thinners. ? Taking medicines such as aspirin and ibuprofen. These medicines can thin your blood. Do not take these medicines before your procedure if your health care provider instructs you not to. ? Taking new dietary supplements or medicines. Do not take these during the week before your procedure unless your health care provider approves them.  If you are told to take a medicine or to continue  taking a medicine on the day of the procedure, take the medicine with sips of water. General instructions   Ask if you will be going home the same day, the following day, or after a longer hospital stay. ? Plan to have someone take you home. ? Plan to have someone stay with you for the first 24 hours after you leave the hospital or clinic.  For 3-6 weeks before the procedure, try not to use any tobacco products, such as cigarettes, chewing tobacco, and e-cigarettes.  You may brush your teeth on the morning of the procedure, but make sure to spit out the toothpaste. What happens during the procedure?  You will be given anesthetics through a mask and through an IV tube in one of your veins.  You may receive medicine to help you relax (sedative).  As soon as you are asleep, a breathing tube may be used to help you breathe.  An anesthesia specialist will stay with you throughout the procedure. He or she will help keep you comfortable and safe by continuing to give you medicines and adjusting the amount of medicine that you get. He or she will also watch your blood pressure, pulse, and oxygen levels to make sure that the anesthetics do not cause any problems.  If a breathing tube was used to help you breathe, it will be removed before you wake up. The procedure may vary among health care providers and hospitals. What happens after the procedure?  You will wake up, often slowly, after the procedure is complete, usually in a recovery area.  Your blood pressure, heart rate, breathing rate, and blood oxygen level will be monitored until the medicines you were given have worn off.  You may be given medicine to help you calm down if you feel anxious or agitated.  If you will be going home the same day, your health care provider may check to make sure you can stand, drink, and urinate.  Your health  care providers will treat your pain and side effects before you go home.  Do not drive for 24  hours if you received a sedative.  You may: ? Feel nauseous and vomit. ? Have a sore throat. ? Have mental slowness. ? Feel cold or shivery. ? Feel sleepy. ? Feel tired. ? Feel sore or achy, even in parts of your body where you did not have surgery. This information is not intended to replace advice given to you by your health care provider. Make sure you discuss any questions you have with your health care provider. Document Released: 08/14/2007 Document Revised: 10/18/2015 Document Reviewed: 04/21/2015 Elsevier Interactive Patient Education  2018 [place] Anesthesia, Adult, Care After These instructions provide you with information about caring for yourself after your procedure. Your health care provider may also give you more specific instructions. Your treatment has been planned according to current medical practices, but problems sometimes occur. Call your health care provider if you have any problems or questions after your procedure. What can I expect after the procedure? After the procedure, it is common to have:  Vomiting.  A sore throat.  Mental slowness.  It is common to feel:  Nauseous.  Cold or shivery.  Sleepy.  Tired.  Sore or achy, even in parts of your body where you did not have surgery.  Follow these instructions at home: For at least 24 hours after the procedure:  Do not: ? Participate in activities where you could fall or become injured. ? Drive. ? Use heavy machinery. ? Drink alcohol. ? Take sleeping pills or medicines that cause drowsiness. ? Make important decisions or sign legal documents. ? Take care of children on your own.  Rest. Eating and drinking  If you vomit, drink water, juice, or soup when you can drink without vomiting.  Drink enough fluid to keep your urine clear or pale yellow.  Make sure you have little or no nausea before eating solid foods.  Follow the diet recommended by your health care  provider. General instructions  Have a responsible adult stay with you until you are awake and alert.  Return to your normal activities as told by your health care provider. Ask your health care provider what activities are safe for you.  Take over-the-counter and prescription medicines only as told by your health care provider.  If you smoke, do not smoke without supervision.  Keep all follow-up visits as told by your health care provider. This is important. Contact a health care provider if:  You continue to have nausea or vomiting at home, and medicines are not helpful.  You cannot drink fluids or start eating again.  You cannot urinate after 8-12 hours.  You develop a skin rash.  You have fever.  You have increasing redness at the site of your procedure. Get help right away if:  You have difficulty breathing.  You have chest pain.  You have unexpected bleeding.  You feel that you are having a life-threatening or urgent problem. This information is not intended to replace advice given to you by your health care provider. Make sure you discuss any questions you have with your health care provider. Document Released: 08/13/2000 Document Revised: 10/10/2015 Document Reviewed: 04/21/2015 Elsevier Interactive Patient Education  [employer].

## 2017-07-26 ENCOUNTER — Other Ambulatory Visit: Payer: Self-pay

## 2017-07-26 ENCOUNTER — Encounter (HOSPITAL_COMMUNITY)
Admission: RE | Admit: 2017-07-26 | Discharge: 2017-07-26 | Disposition: A | Discharge status: Home / Self Care | Payer: Medicare Other | Source: Ambulatory Visit | Attending: General Surgery | Admitting: General Surgery

## 2017-07-26 ENCOUNTER — Encounter (HOSPITAL_COMMUNITY): Payer: Self-pay

## 2017-07-26 DIAGNOSIS — Z01818 Encounter for other preprocedural examination: Secondary | ICD-10-CM | POA: Diagnosis not present

## 2017-07-26 DIAGNOSIS — R9431 Abnormal electrocardiogram [ECG] [EKG]: Secondary | ICD-10-CM | POA: Insufficient documentation

## 2017-07-26 LAB — BASIC METABOLIC PANEL
Anion gap: 10 (ref 5–15)
BUN: 14 mg/dL (ref 6–20)
CO2: 27 mmol/L (ref 22–32)
Calcium: 9.4 mg/dL (ref 8.9–10.3)
Chloride: 104 mmol/L (ref 101–111)
Creatinine, Ser: 0.75 mg/dL (ref 0.44–1.00)
Glucose, Bld: 153 mg/dL — ABNORMAL HIGH (ref 65–99)
POTASSIUM: 3.9 mmol/L (ref 3.5–5.1)
SODIUM: 141 mmol/L (ref 135–145)

## 2017-07-26 LAB — CBC WITH DIFFERENTIAL/PLATELET
BASOS ABS: 0 10*3/uL (ref 0.0–0.1)
BASOS PCT: 1 %
EOS ABS: 0.1 10*3/uL (ref 0.0–0.7)
EOS PCT: 1 %
HCT: 41.2 % (ref 36.0–46.0)
HEMOGLOBIN: 13.1 g/dL (ref 12.0–15.0)
LYMPHS ABS: 2.5 10*3/uL (ref 0.7–4.0)
Lymphocytes Relative: 42 %
MCH: 28.2 pg (ref 26.0–34.0)
MCHC: 31.8 g/dL (ref 30.0–36.0)
MCV: 88.6 fL (ref 78.0–100.0)
Monocytes Absolute: 0.4 10*3/uL (ref 0.1–1.0)
Monocytes Relative: 7 %
NEUTROS PCT: 49 %
Neutro Abs: 2.9 10*3/uL (ref 1.7–7.7)
PLATELETS: 307 10*3/uL (ref 150–400)
RBC: 4.65 MIL/uL (ref 3.87–5.11)
RDW: 14.3 % (ref 11.5–15.5)
WBC: 6 10*3/uL (ref 4.0–10.5)

## 2017-07-29 ENCOUNTER — Encounter: Payer: Self-pay | Admitting: Family Medicine

## 2017-07-31 ENCOUNTER — Ambulatory Visit (HOSPITAL_COMMUNITY): Payer: Medicare Other

## 2017-07-31 ENCOUNTER — Ambulatory Visit (HOSPITAL_COMMUNITY)
Admission: RE | Admit: 2017-07-31 | Discharge: 2017-07-31 | Disposition: A | Discharge status: Home / Self Care | Payer: Medicare Other | Source: Ambulatory Visit | Attending: General Surgery | Admitting: General Surgery

## 2017-07-31 ENCOUNTER — Encounter (HOSPITAL_COMMUNITY): Payer: Self-pay

## 2017-07-31 ENCOUNTER — Ambulatory Visit (HOSPITAL_COMMUNITY): Payer: Medicare Other | Admitting: Certified Registered"

## 2017-07-31 ENCOUNTER — Telehealth: Payer: Self-pay | Admitting: Orthopaedic Surgery

## 2017-07-31 ENCOUNTER — Encounter (HOSPITAL_COMMUNITY)
Admission: RE | Disposition: A | Discharge status: Home / Self Care | Payer: Self-pay | Source: Ambulatory Visit | Attending: General Surgery

## 2017-07-31 DIAGNOSIS — E785 Hyperlipidemia, unspecified: Secondary | ICD-10-CM | POA: Diagnosis not present

## 2017-07-31 DIAGNOSIS — M19079 Primary osteoarthritis, unspecified ankle and foot: Secondary | ICD-10-CM | POA: Diagnosis not present

## 2017-07-31 DIAGNOSIS — Z79899 Other long term (current) drug therapy: Secondary | ICD-10-CM | POA: Diagnosis not present

## 2017-07-31 DIAGNOSIS — Z820 Family history of epilepsy and other diseases of the nervous system: Secondary | ICD-10-CM | POA: Insufficient documentation

## 2017-07-31 DIAGNOSIS — Z8669 Personal history of other diseases of the nervous system and sense organs: Secondary | ICD-10-CM | POA: Diagnosis not present

## 2017-07-31 DIAGNOSIS — Z886 Allergy status to analgesic agent status: Secondary | ICD-10-CM | POA: Diagnosis not present

## 2017-07-31 DIAGNOSIS — Z823 Family history of stroke: Secondary | ICD-10-CM | POA: Diagnosis not present

## 2017-07-31 DIAGNOSIS — D241 Benign neoplasm of right breast: Secondary | ICD-10-CM | POA: Insufficient documentation

## 2017-07-31 DIAGNOSIS — M1710 Unilateral primary osteoarthritis, unspecified knee: Secondary | ICD-10-CM | POA: Diagnosis not present

## 2017-07-31 DIAGNOSIS — N6091 Unspecified benign mammary dysplasia of right breast: Secondary | ICD-10-CM | POA: Diagnosis not present

## 2017-07-31 DIAGNOSIS — N6310 Unspecified lump in the right breast, unspecified quadrant: Secondary | ICD-10-CM

## 2017-07-31 DIAGNOSIS — Z825 Family history of asthma and other chronic lower respiratory diseases: Secondary | ICD-10-CM | POA: Diagnosis not present

## 2017-07-31 DIAGNOSIS — Z7982 Long term (current) use of aspirin: Secondary | ICD-10-CM | POA: Diagnosis not present

## 2017-07-31 DIAGNOSIS — Z833 Family history of diabetes mellitus: Secondary | ICD-10-CM | POA: Diagnosis not present

## 2017-07-31 DIAGNOSIS — Z811 Family history of alcohol abuse and dependence: Secondary | ICD-10-CM | POA: Insufficient documentation

## 2017-07-31 DIAGNOSIS — Z87891 Personal history of nicotine dependence: Secondary | ICD-10-CM | POA: Insufficient documentation

## 2017-07-31 DIAGNOSIS — M479 Spondylosis, unspecified: Secondary | ICD-10-CM | POA: Diagnosis not present

## 2017-07-31 DIAGNOSIS — Z809 Family history of malignant neoplasm, unspecified: Secondary | ICD-10-CM | POA: Insufficient documentation

## 2017-07-31 DIAGNOSIS — Z90710 Acquired absence of both cervix and uterus: Secondary | ICD-10-CM | POA: Diagnosis not present

## 2017-07-31 DIAGNOSIS — I10 Essential (primary) hypertension: Secondary | ICD-10-CM | POA: Diagnosis not present

## 2017-07-31 HISTORY — PX: BREAST BIOPSY: SHX20

## 2017-07-31 SURGERY — BREAST BIOPSY WITH NEEDLE LOCALIZATION
Anesthesia: General | Laterality: Right

## 2017-07-31 MED ORDER — PROPOFOL 10 MG/ML IV BOLUS
INTRAVENOUS | Status: AC
  Filled 2017-07-31: qty 20

## 2017-07-31 MED ORDER — ONDANSETRON HCL 4 MG/2ML IJ SOLN
INTRAMUSCULAR | Status: AC
  Filled 2017-07-31: qty 2

## 2017-07-31 MED ORDER — CHLORHEXIDINE GLUCONATE CLOTH 2 % EX PADS: 6.0000 | MEDICATED_PAD | Freq: Once | CUTANEOUS | Status: DC

## 2017-07-31 MED ORDER — CEFAZOLIN SODIUM-DEXTROSE 2-4 GM/100ML-% IV SOLN
2.0000 g | INTRAVENOUS | Status: AC
  Administered 2017-07-31: 2 g via INTRAVENOUS
  Filled 2017-07-31: qty 100

## 2017-07-31 MED ORDER — BUPIVACAINE LIPOSOME 1.3 % IJ SUSP
INTRAMUSCULAR | Status: DC | PRN
  Administered 2017-07-31: 20 mL

## 2017-07-31 MED ORDER — OXYCODONE HCL 5 MG PO TABS: 5.0000 mg | ORAL_TABLET | Freq: Three times a day (TID) | ORAL | 0 refills | Status: DC | PRN

## 2017-07-31 MED ORDER — FENTANYL CITRATE (PF) 100 MCG/2ML IJ SOLN: 25.0000 ug | INTRAMUSCULAR | Status: DC | PRN

## 2017-07-31 MED ORDER — ONDANSETRON HCL 4 MG/2ML IJ SOLN
4.0000 mg | Freq: Once | INTRAMUSCULAR | Status: AC
  Administered 2017-07-31: 4 mg via INTRAVENOUS

## 2017-07-31 MED ORDER — LACTATED RINGERS IV SOLN
INTRAVENOUS | Status: DC
  Administered 2017-07-31: 09:00:00 via INTRAVENOUS

## 2017-07-31 MED ORDER — FENTANYL CITRATE (PF) 100 MCG/2ML IJ SOLN
INTRAMUSCULAR | Status: DC | PRN
  Administered 2017-07-31 (×2): 25 ug via INTRAVENOUS

## 2017-07-31 MED ORDER — MIDAZOLAM HCL 2 MG/2ML IJ SOLN
1.0000 mg | INTRAMUSCULAR | Status: AC
  Administered 2017-07-31: 2 mg via INTRAVENOUS

## 2017-07-31 MED ORDER — SODIUM CHLORIDE 0.9 % IR SOLN
Status: DC | PRN
  Administered 2017-07-31: 1000 mL

## 2017-07-31 MED ORDER — PROPOFOL 10 MG/ML IV BOLUS
INTRAVENOUS | Status: DC | PRN
  Administered 2017-07-31: 140 mg via INTRAVENOUS

## 2017-07-31 MED ORDER — BUPIVACAINE LIPOSOME 1.3 % IJ SUSP
INTRAMUSCULAR | Status: AC
  Filled 2017-07-31: qty 20

## 2017-07-31 MED ORDER — MIDAZOLAM HCL 2 MG/2ML IJ SOLN
INTRAMUSCULAR | Status: AC
  Filled 2017-07-31: qty 2

## 2017-07-31 MED ORDER — FENTANYL CITRATE (PF) 100 MCG/2ML IJ SOLN
INTRAMUSCULAR | Status: AC
Start: 2017-07-31 — End: ?
  Filled 2017-07-31: qty 2

## 2017-07-31 MED ORDER — LIDOCAINE HCL (PF) 2 % IJ SOLN
INTRAMUSCULAR | Status: AC
  Filled 2017-07-31: qty 10

## 2017-07-31 SURGICAL SUPPLY — 36 items
ADH SKN CLS APL DERMABOND .7 (GAUZE/BANDAGES/DRESSINGS) ×1
BAG HAMPER (MISCELLANEOUS) ×3 IMPLANT
BLADE SURG 15 STRL LF DISP TIS (BLADE) ×1 IMPLANT
BLADE SURG 15 STRL SS (BLADE) ×3
CHLORAPREP W/TINT 26ML (MISCELLANEOUS) ×3 IMPLANT
CLOTH BEACON ORANGE TIMEOUT ST (SAFETY) ×3 IMPLANT
COVER LIGHT HANDLE STERIS (MISCELLANEOUS) ×6 IMPLANT
DECANTER SPIKE VIAL GLASS SM (MISCELLANEOUS) ×3 IMPLANT
DERMABOND ADVANCED (GAUZE/BANDAGES/DRESSINGS) ×2
DERMABOND ADVANCED .7 DNX12 (GAUZE/BANDAGES/DRESSINGS) IMPLANT
ELECT REM PT RETURN 9FT ADLT (ELECTROSURGICAL) ×3
ELECTRODE REM PT RTRN 9FT ADLT (ELECTROSURGICAL) ×1 IMPLANT
FORMALIN 10 PREFIL 120ML (MISCELLANEOUS) ×3 IMPLANT
GLOVE BIO SURGEON STRL SZ 6.5 (GLOVE) ×4 IMPLANT
GLOVE BIO SURGEONS STRL SZ 6.5 (GLOVE) ×2
GLOVE BIOGEL M 6.5 STRL (GLOVE) ×2 IMPLANT
GLOVE BIOGEL PI IND STRL 6.5 (GLOVE) ×1 IMPLANT
GLOVE BIOGEL PI IND STRL 7.0 (GLOVE) ×1 IMPLANT
GLOVE BIOGEL PI INDICATOR 6.5 (GLOVE) ×2
GLOVE BIOGEL PI INDICATOR 7.0 (GLOVE) ×2
GLOVE ECLIPSE 6.5 STRL STRAW (GLOVE) ×2 IMPLANT
GLOVE ECLIPSE 7.0 STRL STRAW (GLOVE) ×2 IMPLANT
GOWN STRL REUS W/TWL LRG LVL3 (GOWN DISPOSABLE) ×9 IMPLANT
KIT TURNOVER KIT A (KITS) ×3 IMPLANT
MANIFOLD NEPTUNE II (INSTRUMENTS) ×3 IMPLANT
NDL HYPO 25X1 1.5 SAFETY (NEEDLE) ×1 IMPLANT
NEEDLE HYPO 25X1 1.5 SAFETY (NEEDLE) ×3 IMPLANT
NS IRRIG 1000ML POUR BTL (IV SOLUTION) ×3 IMPLANT
PACK MINOR (CUSTOM PROCEDURE TRAY) ×3 IMPLANT
PAD ARMBOARD 7.5X6 YLW CONV (MISCELLANEOUS) ×3 IMPLANT
SET BASIN LINEN APH (SET/KITS/TRAYS/PACK) ×3 IMPLANT
SUT SILK 2 0 SH (SUTURE) ×3 IMPLANT
SUT VIC AB 3-0 SH 27 (SUTURE) ×3
SUT VIC AB 3-0 SH 27X BRD (SUTURE) ×1 IMPLANT
SUT VIC AB 4-0 PS2 27 (SUTURE) ×3 IMPLANT
SYR CONTROL 10ML LL (SYRINGE) ×3 IMPLANT

## 2017-07-31 NOTE — Anesthesia Procedure Notes (Signed)
Procedure Name: LMA Insertion Date/Time: 07/31/2017 9:45 AM Performed by: Verlin Grills, CRNA Pre-anesthesia Checklist: Patient identified, Emergency Drugs available, Suction available and Patient being monitored Patient Re-evaluated:Patient Re-evaluated prior to induction Oxygen Delivery Method: Circle system utilized Preoxygenation: Pre-oxygenation with 100% oxygen Induction Type: IV induction Ventilation: Mask ventilation without difficulty LMA: LMA inserted LMA Size: 4.0 Number of attempts: 1 Placement Confirmation: positive ETCO2 and breath sounds checked- equal and bilateral Tube secured with: Tape Dental Injury: Teeth and Oropharynx as per pre-operative assessment

## 2017-07-31 NOTE — Anesthesia Postprocedure Evaluation (Signed)
Anesthesia Post Note  Patient: Jeanette Compton  Procedure(s) Performed: EXCISIONAL BREAST BIOPSY WITH NEEDLE LOCALIZATION (Right )  Patient location during evaluation: PACU Anesthesia Type: General Level of consciousness: awake and alert, oriented and patient cooperative Pain management: satisfactory to patient Vital Signs Assessment: post-procedure vital signs reviewed and stable Respiratory status: spontaneous breathing Cardiovascular status: blood pressure returned to baseline and stable Postop Assessment: no headache and adequate PO intake Anesthetic complications: no     Last Vitals:  Vitals:   07/31/17 1045 07/31/17 1100  BP: 135/78   Pulse: (!) 57 (!) 52  Resp: 11 10  Temp:    SpO2: 97% 97%    Last Pain:  Vitals:   07/31/17 1037  TempSrc:   PainSc: 0-No pain                 Zackery Brine Terri Piedra

## 2017-07-31 NOTE — Discharge Instructions (Signed)
Discharge Instructions: Shower per your regular routine. Take tylenol and ibuprofen as needed for pain control, alternating every 4-6 hours.  Take Roxicodone for breakthrough pain. Take colace for constipation related to narcotic pain medication. Do not pick at the dermabond glue on your incision sites.  Breast Biopsy, Care After These instructions give you information about caring for yourself after your procedure. Your doctor may also give you more specific instructions. Call your doctor if you have any problems or questions after your procedure. Follow these instructions at home: Medicines  Take over-the-counter and prescription medicines only as told by your doctor.  Do not drive for 24 hours if you received a sedative.  Do not drink alcohol while taking pain medicine.  Do not drive or use heavy machinery while taking prescription pain medicine. Biopsy Site Care   Follow instructions from your doctor about how to take care of your cut from surgery (incision) or puncture area. Make sure you: ? Wash your hands with soap and water before you change your bandage. If you cannot use soap and water, use hand sanitizer. ? Change any bandages (dressings) as told by your doctor. ? Leave any stitches (sutures), skin glue, or skin tape (adhesive) strips in place. They may need to stay in place for 2 weeks or longer. If tape strips get loose and curl up, you may trim the loose edges. Do not remove tape strips completely unless your doctor says it is okay.  If you have stitches, keep them dry when you take a bath or a shower.  Check your cut or puncture area every day for signs of infection. Check for: ? More redness, swelling, or pain. ? More fluid or blood. ? Warmth. ? Pus or a bad smell.  Protect the biopsy area. Do not let the area get bumped. Activity  Avoid activities that could pull the biopsy site open. ? Avoid stretching. ? Avoid reaching. ? Avoid exercise. ? Avoid  sports. ? Avoid lifting anything that is heavier than 3 pounds (1.4 kg) for the next few days.   Return to your normal activities as told by your doctor. Ask your doctor what activities are safe for you. General instructions  Continue your normal diet.  Wear a good support bra for as long as told by your doctor.  Get checked for extra fluid in your body (lymphedema) as often as told by your doctor.  Keep all follow-up visits as told by your doctor. This is important. Contact a health care provider if:  You have more redness, swelling, or pain at the biopsy site.  You have more fluid or blood coming from your biopsy site.  Your biopsy site feels warm to the touch.  You have pus or a bad smell coming from the biopsy site.  Your biopsy site breaks open after the stitches, staples, or skin tape strips have been removed.  You have a rash.  You have a fever. Get help right away if:  You have more bleeding (more than a small spot) from the biopsy site.  You have trouble breathing.  You have red streaks around the biopsy site. This information is not intended to replace advice given to you by your health care provider. Make sure you discuss any questions you have with your health care provider. Document Released: 03/03/2009 Document Revised: 01/12/2016 Document Reviewed: 02/08/2015 Elsevier Interactive Patient Education  2018 [employer].

## 2017-07-31 NOTE — Interval H&P Note (Signed)
History and Physical Interval Note:  07/31/2017 9:08 AM  Jeanette Compton  has presented today for surgery, with the diagnosis of intraductalpapilloma of right breast  The various methods of treatment have been discussed with the patient and family. After consideration of risks, benefits and other options for treatment, the patient has consented to  Procedure(s): EXCISIONAL BREAST BIOPSY WITH NEEDLE LOCALIZATION (Right) as a surgical intervention .  The patient's history has been reviewed, patient examined, no change in status, stable for surgery.  I have reviewed the patient's chart and labs.  Questions were answered to the patient's satisfaction.    No additional questions. Right side marked.   Virl Cagey

## 2017-07-31 NOTE — Telephone Encounter (Signed)
Jeanette Compton called today requesting pain medication from Dr. Luna Glasgow.  She stated that she had surgery earlier today and that doctor did give her some pain medication.  I told her that I did not think she could get pain medication from 2 different physicians on the same day.   Also, It appears that Dr. Luna Glasgow released her on her last visit back in January.  At that time, she had no pain at all in her hip.  I told her to check with the doctor that gave her the pain medication today and see if he would give her give additional pain medication once she has taken all of that.  If not, she will probably need to call us to set up an appointment for her hip.    She will call if she needs to schedule an appointment

## 2017-07-31 NOTE — Anesthesia Preprocedure Evaluation (Signed)
Anesthesia Evaluation  Patient identified by MRN, date of birth, ID band Patient awake    Reviewed: Allergy & Precautions, NPO status , Patient's Chart, lab work & pertinent test results  Airway Mallampati: II  TM Distance: >3 FB Neck ROM: Full    Dental  (+) Edentulous Upper, Edentulous Lower   Pulmonary former smoker,    breath sounds clear to auscultation       Cardiovascular hypertension, Pt. on medications  Rhythm:Regular Rate:Normal     Neuro/Psych  Neuromuscular disease ( Neuralgia, post-herpetic 11/13/2016 )    GI/Hepatic negative GI ROS, Neg liver ROS,   Endo/Other    Renal/GU negative Renal ROS     Musculoskeletal   Abdominal   Peds  Hematology   Anesthesia Other Findings   Reproductive/Obstetrics                             Anesthesia Physical Anesthesia Plan  ASA: II  Anesthesia Plan: General   Post-op Pain Management:    Induction: Intravenous  PONV Risk Score and Plan:   Airway Management Planned: LMA  Additional Equipment:   Intra-op Plan:   Post-operative Plan: Extubation in OR  Informed Consent: I have reviewed the patients History and Physical, chart, labs and discussed the procedure including the risks, benefits and alternatives for the proposed anesthesia with the patient or authorized representative who has indicated his/her understanding and acceptance.     Plan Discussed with:   Anesthesia Plan Comments:         Anesthesia Quick Evaluation

## 2017-07-31 NOTE — Transfer of Care (Signed)
Immediate Anesthesia Transfer of Care Note  Patient: Jeanette Compton  Procedure(s) Performed: EXCISIONAL BREAST BIOPSY WITH NEEDLE LOCALIZATION (Right )  Patient Location: PACU  Anesthesia Type:General  Level of Consciousness: awake, oriented, drowsy and patient cooperative  Airway & Oxygen Therapy: Patient Spontanous Breathing and Patient connected to nasal cannula oxygen  Post-op Assessment: Report given to RN, Post -op Vital signs reviewed and stable and Patient moving all extremities  Post vital signs: Reviewed and stable  Last Vitals:  Vitals:   07/31/17 0920 07/31/17 0925  BP: (!) 143/74 119/68  Pulse:    Resp: 15 (!) 21  Temp:    SpO2: 97% 97%    Last Pain:  Vitals:   07/31/17 0737  TempSrc: Oral         Complications: No apparent anesthesia complications

## 2017-07-31 NOTE — Addendum Note (Signed)
Addendum  created 07/31/17 1216 by Charmaine Downs, CRNA   Charge Capture section accepted

## 2017-07-31 NOTE — Op Note (Signed)
[facility] Operative Note  07/31/17  Preoperative Diagnosis: Right breast papilloma    Postoperative Diagnosis: Same   Procedure(s) Performed:  Excisional biopsy of right breast mass following needle localization    Surgeon: Lanell Matar. Constance Haw, MD   Assistants: No qualified resident was available   Anesthesia: General endotracheal   Anesthesiologist: Lerry Liner, MD    Specimens:   Right breast mass (radiology confirmed clip in place; short superior, long lateral) Deep; Superficial, Inferior, Superior, Medial, and Lateral margins    Estimated Blood Loss: Minimal   Blood Replacement: None    Complications: None   Wound Class:Clean   Operative Indications: Jeanette Compton is a 62 yo with a right breast biopsy that demonstrated a papilloma. She was sent to my office for discussion of this finding. Given the low risk of this lesion harboring atypia or cancer, the patient decided she want to pursue excisional biopsy and not do additional imaging and surveillance.  After a discussion of the risk and benefits of the excision biopsy, including bleeding, infection, breast indention, intentional seroma formation, and risk of needing more surgery, she opted to proceed.   Findings:Normal right breast tissue with needle localization wire in place    Procedure: The patient was taken to the operating room and placed supine. General endotracheal anesthesia was induced. Intravenous antibiotics were administered per protocol.  The right chest was prepared and draped in the usual sterile fashion.   A circumareolar incision was made at the area of the needle localization. I reviewed the mammogram following the needle localization to determine the trajectory of the biopsy.  Skin flaps were formed and the needle was brought into the field. Using an Alis clamp, the needle and specimen were carefully dissected out with a generous margin.  The specimen was marked short superior, long  lateral, and sent to radiology with confirmation that the clip was in the specimen.    Additional representative margins were taken at the deep, superficial, superior, inferior, medial and lateral margins.  The cavity was hemostatic and was irrigated. Xparel was injected. The deep dermal layer was closed with a running 3-0 Vicryl and the skin was closed with a running 4-0 Monocryl and dermabond.    Final inspection revealed acceptable hemostasis. All counts were correct at the end of the case. The patient was awakened from anesthesia and extubate without complication.  The patient went to the PACU in stable condition.   Jeanette Labrum, MD [facility] [address] [place], [place] [address] [phone] (office)

## 2017-08-01 ENCOUNTER — Encounter (HOSPITAL_COMMUNITY): Payer: Self-pay | Admitting: General Surgery

## 2017-08-02 NOTE — Addendum Note (Signed)
Addendum  created 08/02/17 0747 by Vista Deck, CRNA   Charge Capture section accepted

## 2017-08-15 ENCOUNTER — Encounter: Payer: Self-pay | Admitting: General Surgery

## 2017-08-15 ENCOUNTER — Ambulatory Visit (INDEPENDENT_AMBULATORY_CARE_PROVIDER_SITE_OTHER): Payer: Self-pay | Admitting: General Surgery

## 2017-08-15 VITALS — BP 159/79 | HR 76 | Temp 98.4°F | Resp 18 | Ht 66.0 in | Wt 217.0 lb

## 2017-08-15 DIAGNOSIS — D241 Benign neoplasm of right breast: Secondary | ICD-10-CM

## 2017-08-15 NOTE — Patient Instructions (Signed)
Continue with annual mammograms.

## 2017-08-15 NOTE — Progress Notes (Signed)
[facility] Note   HPI:  62 y.o. Female presents to clinic for post-op follow-up evaluation after excisional breast biopsy for papilloma. She is doing well. No complaints.   Review of Systems:  Right hip pain No fevers or chills  All other review of systems: otherwise negative   Pathology: Diagnosis 1. Breast, lumpectomy, right - DUCTAL PAPILLOMA. - FIBROCYSTIC CHANGES WITH USUAL DUCTAL HYPERPLASIA. - PREVIOUS CORE BIOPSY SITE AND BIOPSY CLIP. - NO EVIDENCE OF MALIGNANCY. 2. Breast, excision, right deep - BENIGN BREAST TISSUE. - NO EVIDENCE OF MALIGNANCY. - FINAL DEEP MARGIN CLEAR. 3. Breast, excision, right superior - BENIGN ADIPOSE TISSUE. - NO EVIDENCE OF MALIGNANCY. - FINAL SUPERIOR MARGIN CLEAR. 4. Breast, excision, right lateral - BENIGN BREAST TISSUE. - NO EVIDENCE OF MALIGNANCY. - FINAL LATERAL MARGIN CLEAR. 5. Breast, excision, right medial - BENIGN BREAST TISSUE. - NO EVIDENCE OF MALIGNANCY. - FINAL MEDIAL MARGIN CLEAR. 6. Breast, excision, right inferior - BENIGN BREAST TISSUE. - NO EVIDENCE OF MALIGNANCY. - FINAL INFERIOR MARGIN CLEAR. 7. Breast, excision, right superior - BENIGN BREAST TISSUE. - NO EVIDENCE OF MALIGNANCY. - FINAL SUPERIOR MARGIN CLEAR. 1 of Vital Signs:  BP (!) 159/79   Pulse 76   Temp 98.4 F (36.9 C)   Resp 18   Ht 5\' 6"  (1.676 m)   Wt 217 lb (98.4 kg)   BMI 35.02 kg/m    Physical Exam:  Physical Exam  Constitutional: She is well-developed, well-nourished, and in no distress.  HENT:  Head: Normocephalic.  Cardiovascular: Normal rate.  Pulmonary/Chest:  Right breat incision c/d/i with no erythema or drainage, some induration from scarring  Vitals reviewed.   Laboratory studies: None    Imaging:  None    Assessment:  62 y.o. yo Female with a intraductal papilloma and excisional biopsy without signs of any malignancy.   Plan:  - Doing well, scar will soften   - Needs to get pain meds for hip from  PCP or ortho  - Follow up PRN    All of the above recommendations were discussed with the patient, and all of patient's questions were answered to her expressed satisfaction.  Curlene Labrum, MD [facility] [address] [place], [place] [address] [phone] (office)

## 2017-08-28 ENCOUNTER — Ambulatory Visit (INDEPENDENT_AMBULATORY_CARE_PROVIDER_SITE_OTHER): Payer: Medicare Other | Admitting: Orthopedic Surgery

## 2017-08-28 ENCOUNTER — Ambulatory Visit (INDEPENDENT_AMBULATORY_CARE_PROVIDER_SITE_OTHER): Payer: Medicare Other

## 2017-08-28 ENCOUNTER — Encounter: Payer: Self-pay | Admitting: Orthopedic Surgery

## 2017-08-28 VITALS — BP 145/76 | HR 64 | Ht 66.0 in | Wt 214.0 lb

## 2017-08-28 DIAGNOSIS — M47816 Spondylosis without myelopathy or radiculopathy, lumbar region: Secondary | ICD-10-CM

## 2017-08-28 DIAGNOSIS — M25551 Pain in right hip: Secondary | ICD-10-CM | POA: Diagnosis not present

## 2017-08-28 DIAGNOSIS — M1611 Unilateral primary osteoarthritis, right hip: Secondary | ICD-10-CM | POA: Diagnosis not present

## 2017-08-28 MED ORDER — HYDROCODONE-ACETAMINOPHEN 5-325 MG PO TABS: 1.0000 | ORAL_TABLET | Freq: Four times a day (QID) | ORAL | 0 refills | Status: DC | PRN

## 2017-08-28 NOTE — Progress Notes (Signed)
Progress Note   Patient ID: Jeanette Compton, female   DOB: 1956-03-26, 62 y.o.   MRN: [id number]  Chief Complaint  Patient presents with  . Hip Pain    right hip pain with change of positions (sitting to standing)    62 year old female seen 1 in June had arthritis of the hip she also has diagnosis of arthritis of the spine presents with right hip pain increasing over the last 4-6 weeks she has trouble getting out of a chair she has trouble in different positioning.  Her pain is in the groin lateral hip and anterior lateral thigh.  Pain is dull and she would like some hydrocodone for the pain and she is not on any anti-inflammatory medication not on any Tylenol.  Naprosyn hurts her stomach and lisinopril causes lip swelling    Past Medical History:  Diagnosis Date  . Arthritis    knee , ankle and back  . Bronchitis   . Cataract   . Hyperlipidemia   . Hypertension   . Neuralgia, post-herpetic 11/13/2016  . Thyroid disease      Review of Systems  Musculoskeletal: Positive for back pain. Negative for myalgias.  Neurological: Negative for tingling.   Current Meds  Medication Sig  . Ascorbic Acid (VITAMIN C PO) Take 1 tablet by mouth daily.  Marland Kitchen aspirin EC 81 MG tablet Take 81 mg by mouth at bedtime.  . Calcium Carbonate (CALCIUM 600 PO) Take 600 mg by mouth daily.  . Cholecalciferol (VITAMIN D3 PO) Take 1 capsule by mouth daily.  . cyclobenzaprine (FLEXERIL) 10 MG tablet Take 1 tablet (10 mg total) by mouth 3 (three) times daily. (Patient taking differently: Take 10 mg by mouth 3 (three) times daily as needed for muscle spasms. )  . HYDROcodone-acetaminophen (NORCO/VICODIN) 5-325 MG tablet TAKE (1) TABLET BY MOUTH EVERY 6 HOURS AS NEEDED FOR MODERATE PAIN. MUST LAST 30 DAYS. (Patient taking differently: Take 1 tablet by mouth every 6 (six) hours as needed for moderate pain. )  . losartan-hydrochlorothiazide (HYZAAR) 50-12.5 MG tablet Take 1 tablet by mouth daily.  . Multiple Vitamin  (MULTIVITAMIN WITH MINERALS) TABS tablet Take 1 tablet by mouth daily. One-A-Day  . nabumetone (RELAFEN) 750 MG tablet Take 1 tablet (750 mg total) by mouth 2 (two) times daily. One by mouth twice a day after eating.  . pravastatin (PRAVACHOL) 40 MG tablet Take 1 tablet (40 mg total) by mouth daily. (Patient taking differently: Take 40 mg by mouth at bedtime. )    Allergies  Allergen Reactions  . Lisinopril Swelling and Other (See Comments)    Lips swelled  . Naproxen Itching and Other (See Comments)    "hurts my stomach"     BP (!) 145/76   Pulse 64   Ht 5\' 6"  (1.676 m)   Wt 214 lb (97.1 kg)   BMI 34.54 kg/m   Physical Exam  Constitutional: She is oriented to person, place, and time. She appears well-developed and well-nourished.  Musculoskeletal:       Legs: Neurological: She is alert and oriented to person, place, and time. She displays no atrophy and no tremor. No sensory deficit. Gait abnormal. Coordination normal.  Limping favoring the right leg  Psychiatric: She has a normal mood and affect. Judgment normal.  Vitals reviewed.    Medical decision-making Encounter Diagnoses  Name Primary?  . Right hip pain Yes  . Primary osteoarthritis of right hip   . Lumbar spondylosis    Prior x-rays show  osteoarthritis of the right hip with femoral head neck offset abnormalities and pistol-grip deformity with joint space narrowing  New x-rays are ordered today to assess the joint  I offered her several options regarding her hip pain which include but are not limited to   a cane in the left hand Anti-inflammatory medication Tramadol Physical therapy Joint injection Opioid therapy but this was discouraged Hip replacement   After discussion and looking at the x-rays the patient has opted for hip replacement surgery.  She will get a week's worth of hydrocodone.  She will see Dr. 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

## 2017-08-28 NOTE — Addendum Note (Signed)
Addended byCandice Camp on: 08/28/2017 10:25 AM   Modules accepted: Orders

## 2017-09-04 ENCOUNTER — Ambulatory Visit (INDEPENDENT_AMBULATORY_CARE_PROVIDER_SITE_OTHER): Payer: Medicare Other | Admitting: Orthopaedic Surgery

## 2017-09-10 ENCOUNTER — Encounter: Payer: Medicare Other | Admitting: Family Medicine

## 2017-09-11 ENCOUNTER — Ambulatory Visit (INDEPENDENT_AMBULATORY_CARE_PROVIDER_SITE_OTHER): Payer: Medicare Other | Admitting: Orthopaedic Surgery

## 2017-09-11 ENCOUNTER — Encounter (INDEPENDENT_AMBULATORY_CARE_PROVIDER_SITE_OTHER): Payer: Self-pay | Admitting: Orthopaedic Surgery

## 2017-09-11 DIAGNOSIS — M1611 Unilateral primary osteoarthritis, right hip: Secondary | ICD-10-CM

## 2017-09-11 NOTE — Progress Notes (Signed)
Office Visit Note   Patient: Jeanette Compton           Date of Birth: Feb 03, 1956           MRN: [id number] Visit Date: 09/11/2017              Requested by: Carole Civil, [address] [place], [place] [address] PCP: Patient, No Pcp Per   Assessment & Plan: Visit Diagnoses:  1. Unilateral primary osteoarthritis, right hip     Plan: The patient does have severe arthritis in the right hip and is had conservative treatment and pain for well over a year now.  At this point she does recently with hip replacement surgery.  We went over hip x-rays and talked about hip replacement surgery in general.  I had a detailed discussion with her about the risk and benefits of surgery.  We talked about her intraoperative and postoperative course.  At this point she does wish to have this done in the summer.  I will have our surgery scheduler give her a call to work on scheduling the date.  All questions concerns were answered and addressed.  Follow-Up Instructions: Return for 2 weeks post-op.   Orders:  No orders of the defined types were placed in this encounter.  No orders of the defined types were placed in this encounter.     Procedures: No procedures performed   Clinical Data: No additional findings.   Subjective: Chief Complaint  Patient presents with  . Right Hip - Pain  The patient I am seeing for the first time and she is referred from Dr. Arther Abbott in [facility] to evaluate her for hip replacement surgery.  She has seen both Dr. Aline Brochure and Dr. Luna Glasgow before.  She has well-documented known osteoarthritis and degenerative joint disease of her right hip.  She has had pain for over a year now.  She is worked on activity modification as well as taking anti-inflammatories.  Occasionally she has tried a cane in her opposite hand.  She is worked on weight loss as well.  Her pain is daily and is gotten to be 10 out of 10.  Hurts when she lays down at  night and is hard for her to straighten her leg out sometimes because of this.  Her pain is mainly in her groin on the right side.  She is not a diabetic.  At this point her right hip pain is detrimental effect directives the living, quality of life, mobility.  HPI  Review of Systems She currently denies any headache, chest pain, shortness of breath, fever, chills, nausea, vomiting.  Objective: Vital Signs: There were no vitals taken for this visit.  Physical Exam She is alert and oriented x3 and in no acute distress Ortho Exam Examination of her left hip is normal examination of her right hip she has significant pain in the groin with attempts of internal or external rotation.  There is no blocks to rotation but her hip is quite stiff.  When I had her lie flat she is significant amount of pain trying to straighten the knee out. Specialty Comments:  No specialty comments available.  Imaging: No results found. X-rays independently reviewed with consistent show significant arthritis of the right hip joint.  There is significantly narrow superior lateral joint space with particular osteophytes and sclerotic changes as well.  PMFS History: Patient Active Problem List   Diagnosis Date Noted  . Unilateral primary osteoarthritis, right  hip 09/11/2017  . Papilloma of right breast   . Neuralgia, post-herpetic 11/13/2016  . Elevated blood sugar 11/13/2016  . History of colonic polyps 10/05/2016  . HLD (hyperlipidemia) 09/10/2016  . Essential hypertension 09/10/2016  . Primary vitiligo 09/10/2016  . Osteoarthritis of knee 09/10/2016  . Chronic lower back pain 09/10/2016   Past Medical History:  Diagnosis Date  . Arthritis    knee , ankle and back  . Bronchitis   . Cataract   . Hyperlipidemia   . Hypertension   . Neuralgia, post-herpetic 11/13/2016  . Thyroid disease     Family History  Problem Relation Age of Onset  . Stroke Mother   . Diabetes Mother   . Alcohol abuse Father     . Early death Father   . Early death Sister        pneumonia  . Cancer Brother   . Alzheimer's disease Sister   . COPD Brother   . Colon cancer Neg Hx     Past Surgical History:  Procedure Laterality Date  . ABDOMINAL HYSTERECTOMY     fibroid  . BREAST BIOPSY Right 07/31/2017   Procedure: EXCISIONAL BREAST BIOPSY WITH NEEDLE LOCALIZATION;  Surgeon: Virl Cagey, MD;  Location: [facility];  Service: General;  Laterality: Right;  . BREAST SURGERY Right   . COLONOSCOPY  09/13/2011   Procedure: COLONOSCOPY;  Surgeon: Rogene Houston, MD;  Location: [facility];  Service: Endoscopy;  Laterality: N/A;  1030   Social History   Occupational History  . Occupation: disability  Tobacco Use  . Smoking status: Former Smoker    Packs/day: 0.50    Years: 3.00    Pack years: 1.50    Types: Cigarettes    Last attempt to quit: 07/20/2002    Years since quitting: 15.1  . Smokeless tobacco: Never Used  Substance and Sexual Activity  . Alcohol use: No  . Drug use: Yes    Types: Marijuana    Comment: a few weeks ago  . Sexual activity: Never    Birth control/protection: Surgical

## 2017-09-18 ENCOUNTER — Other Ambulatory Visit: Payer: Self-pay | Admitting: Orthopedic Surgery

## 2017-09-18 DIAGNOSIS — M1611 Unilateral primary osteoarthritis, right hip: Secondary | ICD-10-CM

## 2017-09-18 MED ORDER — HYDROCODONE-ACETAMINOPHEN 5-325 MG PO TABS: 1.0000 | ORAL_TABLET | Freq: Four times a day (QID) | ORAL | 0 refills | Status: DC | PRN

## 2017-09-18 NOTE — Telephone Encounter (Signed)
Hydrocodone-Acetaminophen  5/325 mg  Qty  30 Tablets  Take 1 tablet by mouth very 6 (six) hours as needed for moderate pain.  PATIENT USES [facility]  Patient states she is scheduled for surgery in June.

## 2017-11-06 ENCOUNTER — Encounter: Payer: Self-pay | Admitting: Orthopaedic Surgery

## 2017-11-06 ENCOUNTER — Ambulatory Visit (INDEPENDENT_AMBULATORY_CARE_PROVIDER_SITE_OTHER): Payer: Medicare Other | Admitting: Orthopaedic Surgery

## 2017-11-06 VITALS — BP 188/98 | HR 78 | Ht 66.0 in | Wt 218.0 lb

## 2017-11-06 DIAGNOSIS — M25551 Pain in right hip: Secondary | ICD-10-CM

## 2017-11-06 DIAGNOSIS — M1611 Unilateral primary osteoarthritis, right hip: Secondary | ICD-10-CM

## 2017-11-06 MED ORDER — HYDROCODONE-ACETAMINOPHEN 5-325 MG PO TABS: 1.0000 | ORAL_TABLET | Freq: Four times a day (QID) | ORAL | 0 refills | Status: DC | PRN

## 2017-11-06 NOTE — Progress Notes (Signed)
Patient Jeanette Compton, female DOB:[birth year], 62 y.o. SWF:[id number]  Chief Complaint  Patient presents with  . Follow-up    Right hip pain    HPI  Jeanette Compton is a 62 y.o. female who has marked pain of the right hip secondary to significant DJD.  She is scheduled for hip replacement surgery next month by Dr. Ninfa Linden.  She has no new trauma. HPI  Body mass index is 35.19 kg/m.  ROS  Review of Systems  HENT: Negative for congestion.   Respiratory: Negative for cough and shortness of breath.   Cardiovascular: Negative for chest pain and leg swelling.  Endocrine: Positive for cold intolerance.  Musculoskeletal: Positive for arthralgias, gait problem and joint swelling.  Allergic/Immunologic: Positive for environmental allergies.  Neurological: Negative for numbness.  All other systems reviewed and are negative.   Past Medical History:  Diagnosis Date  . Arthritis    knee , ankle and back  . Bronchitis   . Cataract   . Hyperlipidemia   . Hypertension   . Neuralgia, post-herpetic 11/13/2016  . Thyroid disease     Past Surgical History:  Procedure Laterality Date  . ABDOMINAL HYSTERECTOMY     fibroid  . BREAST BIOPSY Right 07/31/2017   Procedure: EXCISIONAL BREAST BIOPSY WITH NEEDLE LOCALIZATION;  Surgeon: Virl Cagey, MD;  Location: [facility];  Service: General;  Laterality: Right;  . BREAST SURGERY Right   . COLONOSCOPY  09/13/2011   Procedure: COLONOSCOPY;  Surgeon: Rogene Houston, MD;  Location: [facility];  Service: Endoscopy;  Laterality: N/A;  1030    Family History  Problem Relation Age of Onset  . Stroke Mother   . Diabetes Mother   . Alcohol abuse Father   . Early death Father   . Early death Sister        pneumonia  . Cancer Brother   . Alzheimer's disease Sister   . COPD Brother   . Colon cancer Neg Hx     Social History Social History   Tobacco Use  . Smoking status: Former Smoker    Packs/day: 0.50    Years: 3.00    Pack  years: 1.50    Types: Cigarettes    Last attempt to quit: 07/20/2002    Years since quitting: 15.3  . Smokeless tobacco: Never Used  Substance Use Topics  . Alcohol use: No  . Drug use: Yes    Types: Marijuana    Comment: a few weeks ago    Allergies  Allergen Reactions  . Lisinopril Swelling and Other (See Comments)    Lips swelled  . Naproxen Itching and Other (See Comments)    "hurts my stomach"    Current Outpatient Medications  Medication Sig Dispense Refill  . Ascorbic Acid (VITAMIN C PO) Take 1 tablet by mouth daily.    Marland Kitchen aspirin EC 81 MG tablet Take 81 mg by mouth at bedtime.    . Calcium Carbonate (CALCIUM 600 PO) Take 600 mg by mouth daily.    . Cholecalciferol (VITAMIN D3 PO) Take 1 capsule by mouth daily.    . cyclobenzaprine (FLEXERIL) 10 MG tablet Take 1 tablet (10 mg total) by mouth 3 (three) times daily. (Patient taking differently: Take 10 mg by mouth 3 (three) times daily as needed for muscle spasms. ) 20 tablet 0  . HYDROcodone-acetaminophen (NORCO/VICODIN) 5-325 MG tablet Take 1 tablet by mouth every 6 (six) hours as needed for moderate pain. 30 tablet 0  . ibuprofen (  ADVIL,MOTRIN) 400 MG tablet Take 1 tablet (400 mg total) by mouth every 6 (six) hours as needed. (Patient not taking: Reported on 07/18/2017) 30 tablet 0  . losartan-hydrochlorothiazide (HYZAAR) 50-12.5 MG tablet Take 1 tablet by mouth daily. 90 tablet 3  . Multiple Vitamin (MULTIVITAMIN WITH MINERALS) TABS tablet Take 1 tablet by mouth daily. One-A-Day    . nabumetone (RELAFEN) 750 MG tablet Take 1 tablet (750 mg total) by mouth 2 (two) times daily. One by mouth twice a day after eating. 60 tablet 5  . pravastatin (PRAVACHOL) 40 MG tablet Take 1 tablet (40 mg total) by mouth daily. (Patient taking differently: Take 40 mg by mouth at bedtime. ) 30 tablet 11   No current facility-administered medications for this visit.      Physical Exam  Blood pressure (!) 188/98, pulse 78, height 5\' 6"  (1.676  m), weight 218 lb (98.9 kg).  Constitutional: overall normal hygiene, normal nutrition, well developed, normal grooming, normal body habitus. Assistive device:none  Musculoskeletal: gait and station Limp right, muscle tone and strength are normal, no tremors or atrophy is present.  .  Neurological: coordination overall normal.  Deep tendon reflex/nerve stretch intact.  Sensation normal.  Cranial nerves II-XII intact.   Skin:   Normal overall no scars, lesions, ulcers or rashes. No psoriasis.  Psychiatric: Alert and oriented x 3.  Recent memory intact, remote memory unclear.  Normal mood and affect. Well groomed.  Good eye contact.  Cardiovascular: overall no swelling, no varicosities, no edema bilaterally, normal temperatures of the legs and arms, no clubbing, cyanosis and good capillary refill.  Lymphatic: palpation is normal.  Right hip with limited motion and pain.  NV intact.  All other systems reviewed and are negative   The patient has been educated about the nature of the problem(s) and counseled on treatment options.  The patient appeared to understand what I have discussed and is in agreement with it.  Encounter Diagnoses  Name Primary?  . Right hip pain Yes  . Primary osteoarthritis of right hip     PLAN Call if any problems.  Precautions discussed.  Continue current medications.   Return to clinic prn   I have reviewed the [facility] web site prior to prescribing narcotic medicine for this patient.  Electronically Signed Sanjuana Kava, MD 6/19/20192:23 PM

## 2017-11-08 ENCOUNTER — Other Ambulatory Visit (INDEPENDENT_AMBULATORY_CARE_PROVIDER_SITE_OTHER): Payer: Self-pay

## 2017-12-03 ENCOUNTER — Other Ambulatory Visit (INDEPENDENT_AMBULATORY_CARE_PROVIDER_SITE_OTHER): Payer: Self-pay | Admitting: Physician Assistant

## 2017-12-10 ENCOUNTER — Other Ambulatory Visit: Payer: Self-pay

## 2017-12-10 ENCOUNTER — Encounter (HOSPITAL_COMMUNITY): Payer: Self-pay

## 2017-12-10 ENCOUNTER — Encounter (HOSPITAL_COMMUNITY)
Admission: RE | Admit: 2017-12-10 | Discharge: 2017-12-10 | Disposition: A | Discharge status: Home / Self Care | Payer: Medicare Other | Source: Ambulatory Visit | Attending: Orthopaedic Surgery | Admitting: Orthopaedic Surgery

## 2017-12-10 DIAGNOSIS — Z888 Allergy status to other drugs, medicaments and biological substances status: Secondary | ICD-10-CM | POA: Diagnosis not present

## 2017-12-10 DIAGNOSIS — M1611 Unilateral primary osteoarthritis, right hip: Secondary | ICD-10-CM | POA: Diagnosis not present

## 2017-12-10 DIAGNOSIS — Z9049 Acquired absence of other specified parts of digestive tract: Secondary | ICD-10-CM | POA: Diagnosis not present

## 2017-12-10 DIAGNOSIS — Z87891 Personal history of nicotine dependence: Secondary | ICD-10-CM | POA: Diagnosis not present

## 2017-12-10 DIAGNOSIS — I10 Essential (primary) hypertension: Secondary | ICD-10-CM | POA: Diagnosis not present

## 2017-12-10 DIAGNOSIS — Z886 Allergy status to analgesic agent status: Secondary | ICD-10-CM | POA: Diagnosis not present

## 2017-12-10 DIAGNOSIS — M25751 Osteophyte, right hip: Secondary | ICD-10-CM | POA: Diagnosis not present

## 2017-12-10 DIAGNOSIS — Z86010 Personal history of colonic polyps: Secondary | ICD-10-CM | POA: Diagnosis not present

## 2017-12-10 DIAGNOSIS — L80 Vitiligo: Secondary | ICD-10-CM | POA: Diagnosis not present

## 2017-12-10 DIAGNOSIS — E785 Hyperlipidemia, unspecified: Secondary | ICD-10-CM | POA: Diagnosis not present

## 2017-12-10 LAB — BASIC METABOLIC PANEL
Anion gap: 10 (ref 5–15)
BUN: 15 mg/dL (ref 8–23)
CHLORIDE: 103 mmol/L (ref 98–111)
CO2: 28 mmol/L (ref 22–32)
Calcium: 9.9 mg/dL (ref 8.9–10.3)
Creatinine, Ser: 0.84 mg/dL (ref 0.44–1.00)
GFR calc non Af Amer: >60 mL/min (ref >60)
Glucose, Bld: 98 mg/dL (ref 70–99)
POTASSIUM: 4.6 mmol/L (ref 3.5–5.1)
SODIUM: 141 mmol/L (ref 135–145)

## 2017-12-10 LAB — CBC
HCT: 44.4 % (ref 36.0–46.0)
Hemoglobin: 14.6 g/dL (ref 12.0–15.0)
MCH: 28.2 pg (ref 26.0–34.0)
MCHC: 32.9 g/dL (ref 30.0–36.0)
MCV: 85.9 fL (ref 78.0–100.0)
PLATELETS: 355 10*3/uL (ref 150–400)
RBC: 5.17 MIL/uL — ABNORMAL HIGH (ref 3.87–5.11)
RDW: 14.3 % (ref 11.5–15.5)
WBC: 7 10*3/uL (ref 4.0–10.5)

## 2017-12-10 LAB — SURGICAL PCR SCREEN
MRSA, PCR: NEGATIVE
STAPHYLOCOCCUS AUREUS: NEGATIVE

## 2017-12-10 NOTE — Patient Instructions (Signed)
Jeanette Compton  12/10/2017   Your procedure is scheduled on: Friday 12/13/2017  Report to [facility] Main  Entrance              Report to admitting at  0530 AM    Call this number if you have problems the morning of surgery [phone]    Remember: Do not eat food or drink liquids :After Midnight.     Take these medicines the morning of surgery with A SIP OF WATER: Gabapentin (Neurontin)                                You may not have any metal on your body including hair pins and              piercings  Do not wear jewelry, make-up, lotions, powders or perfumes, deodorant             Do not wear nail polish.  Do not shave  48 hours prior to surgery.              .   Do not bring valuables to the hospital. [name].  Contacts, dentures or bridgework may not be worn into surgery.  Leave suitcase in the car. After surgery it may be brought to your room.                   Please read over the following fact sheets you were given: _____________________________________________________________________             [facility] - Preparing for Surgery Before surgery, you can play an important role.  Because skin is not sterile, your skin needs to be as free of germs as possible.  You can reduce the number of germs on your skin by washing with CHG (chlorahexidine gluconate) soap before surgery.  CHG is an antiseptic cleaner which kills germs and bonds with the skin to continue killing germs even after washing. Please DO NOT use if you have an allergy to CHG or antibacterial soaps.  If your skin becomes reddened/irritated stop using the CHG and inform your nurse when you arrive at Short Stay. Do not shave (including legs and underarms) for at least 48 hours prior to the first CHG shower.  You may shave your face/neck. Please follow these instructions carefully:  1.  Shower with CHG Soap the night before surgery  and the  morning of Surgery.  2.  If you choose to wash your hair, wash your hair first as usual with your  normal  shampoo.  3.  After you shampoo, rinse your hair and body thoroughly to remove the  shampoo.                           4.  Use CHG as you would any other liquid soap.  You can apply chg directly  to the skin and wash                       Gently with a scrungie or clean washcloth.  5.  Apply the CHG Soap to your body ONLY FROM THE NECK DOWN.   Do not use  on face/ open                           Wound or open sores. Avoid contact with eyes, ears mouth and genitals (private parts).                       Wash face,  Genitals (private parts) with your normal soap.             6.  Wash thoroughly, paying special attention to the area where your surgery  will be performed.  7.  Thoroughly rinse your body with warm water from the neck down.  8.  DO NOT shower/wash with your normal soap after using and rinsing off  the CHG Soap.                9.  Pat yourself dry with a clean towel.            10.  Wear clean pajamas.            11.  Place clean sheets on your bed the night of your first shower and do not  sleep with pets. Day of Surgery : Do not apply any lotions/deodorants the morning of surgery.  Please wear clean clothes to the hospital/surgery center.  FAILURE TO FOLLOW THESE INSTRUCTIONS MAY RESULT IN THE CANCELLATION OF YOUR SURGERY PATIENT SIGNATURE_________________________________  NURSE SIGNATURE__________________________________  ________________________________________________________________________   Adam Phenix  An incentive spirometer is a tool that can help keep your lungs clear and active. This tool measures how well you are filling your lungs with each breath. Taking long deep breaths may help reverse or decrease the chance of developing breathing (pulmonary) problems (especially infection) following:  A long period of time when you are unable to move or  be active. BEFORE THE PROCEDURE   If the spirometer includes an indicator to show your best effort, your nurse or respiratory therapist will set it to a desired goal.  If possible, sit up straight or lean slightly forward. Try not to slouch.  Hold the incentive spirometer in an upright position. INSTRUCTIONS FOR USE  1. Sit on the edge of your bed if possible, or sit up as far as you can in bed or on a chair. 2. Hold the incentive spirometer in an upright position. 3. Breathe out normally. 4. Place the mouthpiece in your mouth and seal your lips tightly around it. 5. Breathe in slowly and as deeply as possible, raising the piston or the ball toward the top of the column. 6. Hold your breath for 3-5 seconds or for as long as possible. Allow the piston or ball to fall to the bottom of the column. 7. Remove the mouthpiece from your mouth and breathe out normally. 8. Rest for a few seconds and repeat Steps 1 through 7 at least 10 times every 1-2 hours when you are awake. Take your time and take a few normal breaths between deep breaths. 9. The spirometer may include an indicator to show your best effort. Use the indicator as a goal to work toward during each repetition. 10. After each set of 10 deep breaths, practice coughing to be sure your lungs are clear. If you have an incision (the cut made at the time of surgery), support your incision when coughing by placing a pillow or rolled up towels firmly against it. Once you are able to get out of bed, walk around  indoors and cough well. You may stop using the incentive spirometer when instructed by your caregiver.  RISKS AND COMPLICATIONS  Take your time so you do not get dizzy or light-headed.  If you are in pain, you may need to take or ask for pain medication before doing incentive spirometry. It is harder to take a deep breath if you are having pain. AFTER USE  Rest and breathe slowly and easily.  It can be helpful to keep track of a log  of your progress. Your caregiver can provide you with a simple table to help with this. If you are using the spirometer at home, follow these instructions: [name] IF:   You are having difficultly using the spirometer.  You have trouble using the spirometer as often as instructed.  Your pain medication is not giving enough relief while using the spirometer.  You develop fever of 100.5 F (38.1 C) or higher. SEEK IMMEDIATE MEDICAL CARE IF:   You cough up bloody sputum that had not been present before.  You develop fever of 102 F (38.9 C) or greater.  You develop worsening pain at or near the incision site. MAKE SURE YOU:   Understand these instructions.  Will watch your condition.  Will get help right away if you are not doing well or get worse. Document Released: 09/17/2006 Document Revised: 07/30/2011 Document Reviewed: 11/18/2006 [facility] Patient Information 2014 [place], [place].   ________________________________________________________________________

## 2017-12-13 ENCOUNTER — Encounter (HOSPITAL_COMMUNITY): Payer: Self-pay | Admitting: *Deleted

## 2017-12-13 ENCOUNTER — Other Ambulatory Visit: Payer: Self-pay

## 2017-12-13 ENCOUNTER — Encounter (HOSPITAL_COMMUNITY)
Admission: RE | Disposition: A | Discharge status: Home Health | Payer: Self-pay | Source: Ambulatory Visit | Attending: Orthopaedic Surgery

## 2017-12-13 ENCOUNTER — Inpatient Hospital Stay (HOSPITAL_COMMUNITY): Payer: Medicare Other | Admitting: Anesthesiology

## 2017-12-13 ENCOUNTER — Inpatient Hospital Stay (HOSPITAL_COMMUNITY): Payer: Medicare Other

## 2017-12-13 ENCOUNTER — Inpatient Hospital Stay (HOSPITAL_COMMUNITY)
Admission: RE | Admit: 2017-12-13 | Discharge: 2017-12-15 | DRG: 470 | Disposition: A | Discharge status: Home Health | Payer: Medicare Other | Source: Ambulatory Visit | Attending: Orthopaedic Surgery | Admitting: Orthopaedic Surgery

## 2017-12-13 DIAGNOSIS — M25751 Osteophyte, right hip: Secondary | ICD-10-CM | POA: Diagnosis not present

## 2017-12-13 DIAGNOSIS — M1611 Unilateral primary osteoarthritis, right hip: Secondary | ICD-10-CM | POA: Diagnosis present

## 2017-12-13 DIAGNOSIS — Z87891 Personal history of nicotine dependence: Secondary | ICD-10-CM | POA: Diagnosis not present

## 2017-12-13 DIAGNOSIS — M1990 Unspecified osteoarthritis, unspecified site: Secondary | ICD-10-CM

## 2017-12-13 DIAGNOSIS — E785 Hyperlipidemia, unspecified: Secondary | ICD-10-CM | POA: Diagnosis present

## 2017-12-13 DIAGNOSIS — I10 Essential (primary) hypertension: Secondary | ICD-10-CM | POA: Diagnosis present

## 2017-12-13 DIAGNOSIS — Z886 Allergy status to analgesic agent status: Secondary | ICD-10-CM | POA: Diagnosis not present

## 2017-12-13 DIAGNOSIS — Z96641 Presence of right artificial hip joint: Secondary | ICD-10-CM | POA: Diagnosis not present

## 2017-12-13 DIAGNOSIS — Z86010 Personal history of colonic polyps: Secondary | ICD-10-CM | POA: Diagnosis not present

## 2017-12-13 DIAGNOSIS — Z888 Allergy status to other drugs, medicaments and biological substances status: Secondary | ICD-10-CM | POA: Diagnosis not present

## 2017-12-13 DIAGNOSIS — L80 Vitiligo: Secondary | ICD-10-CM | POA: Diagnosis present

## 2017-12-13 DIAGNOSIS — Z9049 Acquired absence of other specified parts of digestive tract: Secondary | ICD-10-CM

## 2017-12-13 DIAGNOSIS — Z471 Aftercare following joint replacement surgery: Secondary | ICD-10-CM | POA: Diagnosis not present

## 2017-12-13 DIAGNOSIS — E669 Obesity, unspecified: Secondary | ICD-10-CM | POA: Diagnosis present

## 2017-12-13 HISTORY — PX: TOTAL HIP ARTHROPLASTY: SHX124

## 2017-12-13 SURGERY — ARTHROPLASTY, HIP, TOTAL, ANTERIOR APPROACH
Anesthesia: General | Site: Hip | Laterality: Right

## 2017-12-13 MED ORDER — CEFAZOLIN SODIUM-DEXTROSE 2-4 GM/100ML-% IV SOLN
2.0000 g | INTRAVENOUS | Status: AC
  Administered 2017-12-13: 2 g via INTRAVENOUS
  Filled 2017-12-13: qty 100

## 2017-12-13 MED ORDER — POLYETHYLENE GLYCOL 3350 17 G PO PACK: 17.0000 g | PACK | Freq: Every day | ORAL | Status: DC | PRN

## 2017-12-13 MED ORDER — ONDANSETRON HCL 4 MG PO TABS
4.0000 mg | ORAL_TABLET | Freq: Four times a day (QID) | ORAL | Status: DC | PRN
  Administered 2017-12-14: 4 mg via ORAL
  Filled 2017-12-13: qty 1

## 2017-12-13 MED ORDER — ALUM & MAG HYDROXIDE-SIMETH 200-200-20 MG/5ML PO SUSP: 30.0000 mL | ORAL | Status: DC | PRN

## 2017-12-13 MED ORDER — BUPIVACAINE IN DEXTROSE 0.75-8.25 % IT SOLN
INTRATHECAL | Status: DC | PRN
  Administered 2017-12-13: 1.8 mL via INTRATHECAL

## 2017-12-13 MED ORDER — ONDANSETRON HCL 4 MG/2ML IJ SOLN: 4.0000 mg | Freq: Once | INTRAMUSCULAR | Status: DC | PRN

## 2017-12-13 MED ORDER — METHOCARBAMOL 500 MG PO TABS
500.0000 mg | ORAL_TABLET | Freq: Four times a day (QID) | ORAL | Status: DC | PRN
  Administered 2017-12-13 – 2017-12-14 (×3): 500 mg via ORAL
  Filled 2017-12-13 (×3): qty 1

## 2017-12-13 MED ORDER — DEXAMETHASONE SODIUM PHOSPHATE 10 MG/ML IJ SOLN
INTRAMUSCULAR | Status: DC | PRN
  Administered 2017-12-13: 8 mg via INTRAVENOUS

## 2017-12-13 MED ORDER — DOCUSATE SODIUM 100 MG PO CAPS
100.0000 mg | ORAL_CAPSULE | Freq: Two times a day (BID) | ORAL | Status: DC
Start: 2017-12-13 — End: 2017-12-15
  Administered 2017-12-13 – 2017-12-15 (×4): 100 mg via ORAL
  Filled 2017-12-13 (×4): qty 1

## 2017-12-13 MED ORDER — MENTHOL 3 MG MT LOZG: 1.0000 | LOZENGE | OROMUCOSAL | Status: DC | PRN

## 2017-12-13 MED ORDER — LOSARTAN POTASSIUM 50 MG PO TABS
50.0000 mg | ORAL_TABLET | Freq: Every day | ORAL | Status: DC
  Administered 2017-12-14 – 2017-12-15 (×2): 50 mg via ORAL
  Filled 2017-12-13 (×2): qty 1

## 2017-12-13 MED ORDER — CHLORHEXIDINE GLUCONATE 4 % EX LIQD: 60.0000 mL | Freq: Once | CUTANEOUS | Status: DC

## 2017-12-13 MED ORDER — PROPOFOL 10 MG/ML IV BOLUS
INTRAVENOUS | Status: AC
  Filled 2017-12-13: qty 60

## 2017-12-13 MED ORDER — KETOROLAC TROMETHAMINE 30 MG/ML IJ SOLN
30.0000 mg | Freq: Once | INTRAMUSCULAR | Status: AC
  Administered 2017-12-13: 30 mg via INTRAVENOUS
  Filled 2017-12-13: qty 1

## 2017-12-13 MED ORDER — METHOCARBAMOL 1000 MG/10ML IJ SOLN
500.0000 mg | Freq: Four times a day (QID) | INTRAVENOUS | Status: DC | PRN
  Administered 2017-12-13: 500 mg via INTRAVENOUS
  Filled 2017-12-13: qty 550

## 2017-12-13 MED ORDER — METOCLOPRAMIDE HCL 5 MG PO TABS
5.0000 mg | ORAL_TABLET | Freq: Three times a day (TID) | ORAL | Status: DC | PRN
  Administered 2017-12-14: 10 mg via ORAL
  Filled 2017-12-13: qty 2

## 2017-12-13 MED ORDER — DIPHENHYDRAMINE HCL 12.5 MG/5ML PO ELIX: 12.5000 mg | ORAL_SOLUTION | ORAL | Status: DC | PRN

## 2017-12-13 MED ORDER — CEFAZOLIN SODIUM-DEXTROSE 1-4 GM/50ML-% IV SOLN
1.0000 g | Freq: Four times a day (QID) | INTRAVENOUS | Status: AC
  Administered 2017-12-13 (×2): 1 g via INTRAVENOUS
  Filled 2017-12-13 (×2): qty 50

## 2017-12-13 MED ORDER — FENTANYL CITRATE (PF) 100 MCG/2ML IJ SOLN
INTRAMUSCULAR | Status: AC
  Administered 2017-12-13: 25 ug via INTRAVENOUS
  Filled 2017-12-13: qty 2

## 2017-12-13 MED ORDER — SODIUM CHLORIDE 0.9 % IV SOLN
INTRAVENOUS | Status: DC
  Administered 2017-12-13: 11:00:00 via INTRAVENOUS

## 2017-12-13 MED ORDER — PROPOFOL 500 MG/50ML IV EMUL
INTRAVENOUS | Status: DC | PRN
  Administered 2017-12-13: 75 ug/kg/min via INTRAVENOUS

## 2017-12-13 MED ORDER — LOSARTAN POTASSIUM-HCTZ 50-12.5 MG PO TABS: 1.0000 | ORAL_TABLET | Freq: Every day | ORAL | Status: DC

## 2017-12-13 MED ORDER — METOCLOPRAMIDE HCL 5 MG/ML IJ SOLN
5.0000 mg | Freq: Three times a day (TID) | INTRAMUSCULAR | Status: DC | PRN
  Filled 2017-12-13: qty 2

## 2017-12-13 MED ORDER — MIDAZOLAM HCL 2 MG/2ML IJ SOLN
INTRAMUSCULAR | Status: AC
  Filled 2017-12-13: qty 2

## 2017-12-13 MED ORDER — FENTANYL CITRATE (PF) 100 MCG/2ML IJ SOLN
INTRAMUSCULAR | Status: DC | PRN
  Administered 2017-12-13: 50 ug via INTRAVENOUS

## 2017-12-13 MED ORDER — TRANEXAMIC ACID 1000 MG/10ML IV SOLN
1000.0000 mg | INTRAVENOUS | Status: AC
  Administered 2017-12-13: 1000 mg via INTRAVENOUS
  Filled 2017-12-13: qty 10

## 2017-12-13 MED ORDER — HYDROMORPHONE HCL 1 MG/ML IJ SOLN
0.5000 mg | INTRAMUSCULAR | Status: DC | PRN
  Administered 2017-12-13 (×2): 0.5 mg via INTRAVENOUS
  Administered 2017-12-14: 1 mg via INTRAVENOUS
  Filled 2017-12-13 (×3): qty 1

## 2017-12-13 MED ORDER — OXYCODONE HCL 5 MG PO TABS
5.0000 mg | ORAL_TABLET | ORAL | Status: DC | PRN
  Administered 2017-12-13: 5 mg via ORAL
  Administered 2017-12-14 (×2): 10 mg via ORAL
  Administered 2017-12-14: 5 mg via ORAL
  Administered 2017-12-14: 10 mg via ORAL
  Administered 2017-12-14: 5 mg via ORAL
  Filled 2017-12-13 (×4): qty 2
  Filled 2017-12-13 (×2): qty 1

## 2017-12-13 MED ORDER — PHENYLEPHRINE HCL 10 MG/ML IJ SOLN
INTRAMUSCULAR | Status: AC
  Filled 2017-12-13: qty 1

## 2017-12-13 MED ORDER — PRAVASTATIN SODIUM 40 MG PO TABS
40.0000 mg | ORAL_TABLET | Freq: Every day | ORAL | Status: DC
  Administered 2017-12-13 – 2017-12-14 (×2): 40 mg via ORAL
  Filled 2017-12-13: qty 2
  Filled 2017-12-13 (×2): qty 1
  Filled 2017-12-13: qty 2

## 2017-12-13 MED ORDER — ASPIRIN 81 MG PO CHEW
81.0000 mg | CHEWABLE_TABLET | Freq: Two times a day (BID) | ORAL | Status: DC
  Administered 2017-12-13 – 2017-12-15 (×4): 81 mg via ORAL
  Filled 2017-12-13 (×4): qty 1

## 2017-12-13 MED ORDER — PANTOPRAZOLE SODIUM 40 MG PO TBEC
40.0000 mg | DELAYED_RELEASE_TABLET | Freq: Every day | ORAL | Status: DC
  Administered 2017-12-13 – 2017-12-15 (×3): 40 mg via ORAL
  Filled 2017-12-13 (×3): qty 1

## 2017-12-13 MED ORDER — SODIUM CHLORIDE 0.9 % IR SOLN
Status: DC | PRN
  Administered 2017-12-13: 1000 mL

## 2017-12-13 MED ORDER — FENTANYL CITRATE (PF) 100 MCG/2ML IJ SOLN
25.0000 ug | INTRAMUSCULAR | Status: DC | PRN
  Administered 2017-12-13 (×2): 25 ug via INTRAVENOUS

## 2017-12-13 MED ORDER — ZOLPIDEM TARTRATE 5 MG PO TABS: 5.0000 mg | ORAL_TABLET | Freq: Every evening | ORAL | Status: DC | PRN

## 2017-12-13 MED ORDER — FENTANYL CITRATE (PF) 100 MCG/2ML IJ SOLN
INTRAMUSCULAR | Status: AC
  Filled 2017-12-13: qty 2

## 2017-12-13 MED ORDER — EPHEDRINE 5 MG/ML INJ
INTRAVENOUS | Status: AC
  Filled 2017-12-13: qty 10

## 2017-12-13 MED ORDER — HYDROCHLOROTHIAZIDE 12.5 MG PO CAPS
12.5000 mg | ORAL_CAPSULE | Freq: Every day | ORAL | Status: DC
  Administered 2017-12-14 – 2017-12-15 (×2): 12.5 mg via ORAL
  Filled 2017-12-13 (×2): qty 1

## 2017-12-13 MED ORDER — ADULT MULTIVITAMIN W/MINERALS CH
1.0000 | ORAL_TABLET | Freq: Every day | ORAL | Status: DC
  Administered 2017-12-14 – 2017-12-15 (×2): 1 via ORAL
  Filled 2017-12-13 (×2): qty 1

## 2017-12-13 MED ORDER — ONDANSETRON HCL 4 MG/2ML IJ SOLN
INTRAMUSCULAR | Status: AC
  Filled 2017-12-13: qty 2

## 2017-12-13 MED ORDER — PROPOFOL 10 MG/ML IV BOLUS
INTRAVENOUS | Status: DC | PRN
  Administered 2017-12-13: 30 mg via INTRAVENOUS

## 2017-12-13 MED ORDER — MIDAZOLAM HCL 5 MG/5ML IJ SOLN
INTRAMUSCULAR | Status: DC | PRN
  Administered 2017-12-13: 2 mg via INTRAVENOUS

## 2017-12-13 MED ORDER — LACTATED RINGERS IV SOLN
INTRAVENOUS | Status: DC
  Administered 2017-12-13 (×3): via INTRAVENOUS

## 2017-12-13 MED ORDER — STERILE WATER FOR IRRIGATION IR SOLN
Status: DC | PRN
  Administered 2017-12-13: 2000 mL

## 2017-12-13 MED ORDER — OXYCODONE HCL 5 MG PO TABS
10.0000 mg | ORAL_TABLET | ORAL | Status: DC | PRN
  Administered 2017-12-13: 15 mg via ORAL
  Administered 2017-12-13: 10 mg via ORAL
  Administered 2017-12-13: 15 mg via ORAL
  Filled 2017-12-13: qty 2
  Filled 2017-12-13 (×2): qty 3

## 2017-12-13 MED ORDER — ONDANSETRON HCL 4 MG/2ML IJ SOLN
INTRAMUSCULAR | Status: DC | PRN
  Administered 2017-12-13: 4 mg via INTRAVENOUS

## 2017-12-13 MED ORDER — EPHEDRINE SULFATE-NACL 50-0.9 MG/10ML-% IV SOSY
PREFILLED_SYRINGE | INTRAVENOUS | Status: DC | PRN
Start: 2017-12-13 — End: 2017-12-13
  Administered 2017-12-13: 5 mg via INTRAVENOUS

## 2017-12-13 MED ORDER — DEXAMETHASONE SODIUM PHOSPHATE 10 MG/ML IJ SOLN
INTRAMUSCULAR | Status: AC
  Filled 2017-12-13: qty 1

## 2017-12-13 MED ORDER — SODIUM CHLORIDE 0.9 % IV SOLN
INTRAVENOUS | Status: DC | PRN
  Administered 2017-12-13: 40 ug/min via INTRAVENOUS

## 2017-12-13 MED ORDER — ACETAMINOPHEN 325 MG PO TABS
325.0000 mg | ORAL_TABLET | Freq: Four times a day (QID) | ORAL | Status: DC | PRN
  Administered 2017-12-14: 650 mg via ORAL
  Filled 2017-12-13: qty 2

## 2017-12-13 MED ORDER — ONDANSETRON HCL 4 MG/2ML IJ SOLN: 4.0000 mg | Freq: Four times a day (QID) | INTRAMUSCULAR | Status: DC | PRN

## 2017-12-13 MED ORDER — PHENOL 1.4 % MT LIQD
1.0000 | OROMUCOSAL | Status: DC | PRN
  Filled 2017-12-13: qty 177

## 2017-12-13 MED ORDER — GABAPENTIN 300 MG PO CAPS
300.0000 mg | ORAL_CAPSULE | Freq: Every day | ORAL | Status: DC
  Administered 2017-12-14 – 2017-12-15 (×2): 300 mg via ORAL
  Filled 2017-12-13 (×2): qty 1

## 2017-12-13 MED ORDER — 0.9 % SODIUM CHLORIDE (POUR BTL) OPTIME
TOPICAL | Status: DC | PRN
  Administered 2017-12-13: 1000 mL

## 2017-12-13 SURGICAL SUPPLY — 39 items
APL SKNCLS STERI-STRIP NONHPOA (GAUZE/BANDAGES/DRESSINGS)
BAG SPEC THK2 15X12 ZIP CLS (MISCELLANEOUS)
BAG ZIPLOCK 12X15 (MISCELLANEOUS) IMPLANT
BENZOIN TINCTURE PRP APPL 2/3 (GAUZE/BANDAGES/DRESSINGS) IMPLANT
BLADE SAW SGTL 18X1.27X75 (BLADE) ×2 IMPLANT
BLADE SAW SGTL 18X1.27X75MM (BLADE) ×1
CAPT HIP TOTAL 2 ×2 IMPLANT
CLOSURE WOUND 1/2 X4 (GAUZE/BANDAGES/DRESSINGS)
COVER PERINEAL POST (MISCELLANEOUS) ×3 IMPLANT
COVER SURGICAL LIGHT HANDLE (MISCELLANEOUS) ×3 IMPLANT
DRAPE STERI IOBAN 125X83 (DRAPES) ×3 IMPLANT
DRAPE U-SHAPE 47X51 STRL (DRAPES) ×6 IMPLANT
DRSG AQUACEL AG ADV 3.5X10 (GAUZE/BANDAGES/DRESSINGS) ×3 IMPLANT
DURAPREP 26ML APPLICATOR (WOUND CARE) ×3 IMPLANT
ELECT REM PT RETURN 15FT ADLT (MISCELLANEOUS) ×3 IMPLANT
GAUZE XEROFORM 1X8 LF (GAUZE/BANDAGES/DRESSINGS) ×2 IMPLANT
GLOVE BIO SURGEON STRL SZ7.5 (GLOVE) ×3 IMPLANT
GLOVE BIOGEL PI IND STRL 7.0 (GLOVE) IMPLANT
GLOVE BIOGEL PI IND STRL 8 (GLOVE) ×2 IMPLANT
GLOVE BIOGEL PI INDICATOR 7.0 (GLOVE) ×4
GLOVE BIOGEL PI INDICATOR 8 (GLOVE) ×4
GLOVE ECLIPSE 8.0 STRL XLNG CF (GLOVE) ×3 IMPLANT
GLOVE SURG SS PI 6.5 STRL IVOR (GLOVE) ×4 IMPLANT
GOWN STRL REUS W/TWL LRG LVL4 (GOWN DISPOSABLE) ×2 IMPLANT
GOWN STRL REUS W/TWL XL LVL3 (GOWN DISPOSABLE) ×8 IMPLANT
HANDPIECE INTERPULSE COAX TIP (DISPOSABLE) ×3
HOLDER FOLEY CATH W/STRAP (MISCELLANEOUS) ×3 IMPLANT
PACK ANTERIOR HIP CUSTOM (KITS) ×3 IMPLANT
SET HNDPC FAN SPRY TIP SCT (DISPOSABLE) ×1 IMPLANT
STAPLER VISISTAT 35W (STAPLE) IMPLANT
STRIP CLOSURE SKIN 1/2X4 (GAUZE/BANDAGES/DRESSINGS) IMPLANT
SUT ETHIBOND NAB CT1 #1 30IN (SUTURE) ×3 IMPLANT
SUT MNCRL AB 4-0 PS2 18 (SUTURE) IMPLANT
SUT VIC AB 0 CT1 36 (SUTURE) ×3 IMPLANT
SUT VIC AB 1 CT1 36 (SUTURE) ×3 IMPLANT
SUT VIC AB 2-0 CT1 27 (SUTURE) ×6
SUT VIC AB 2-0 CT1 TAPERPNT 27 (SUTURE) ×2 IMPLANT
TRAY FOLEY CATH 14FR (SET/KITS/TRAYS/PACK) ×2 IMPLANT
YANKAUER SUCT BULB TIP 10FT TU (MISCELLANEOUS) ×3 IMPLANT

## 2017-12-13 NOTE — Plan of Care (Signed)

## 2017-12-13 NOTE — Progress Notes (Signed)
Spoke to patient in rm she plans to d/c home. Has cane. [facility] @ [facility] rep Ronalee Belts following if [facility] needed can arrange once confirmed d/c plans.

## 2017-12-13 NOTE — Transfer of Care (Signed)
Immediate Anesthesia Transfer of Care Note  Patient: Jeanette Compton  Procedure(s) Performed: Procedure(s): RIGHT TOTAL HIP ARTHROPLASTY ANTERIOR APPROACH (Right)  Patient Location: PACU  Anesthesia Type:Spinal  Level of Consciousness:  sedated, patient cooperative and responds to stimulation  Airway & Oxygen Therapy:Patient Spontanous Breathing and Patient connected to face mask oxgen  Post-op Assessment:  Report given to PACU RN and Post -op Vital signs reviewed and stable  Post vital signs:  Reviewed and stable  Last Vitals:  Vitals:   12/13/17 0605  BP: 132/78  Pulse: 80  Resp: 18  Temp: 36.5 C  SpO2: 174%    Complications: No apparent anesthesia complications

## 2017-12-13 NOTE — H&P (Signed)
TOTAL HIP ADMISSION H&P  Patient is admitted for right total hip arthroplasty.  Subjective:  Chief Complaint: right hip pain  HPI: Jeanette Compton, 62 y.o. female, has a history of pain and functional disability in the right hip(s) due to arthritis and patient has failed non-surgical conservative treatments for greater than 12 weeks to include NSAID's and/or analgesics, corticosteriod injections, use of assistive devices, weight reduction as appropriate and activity modification.  Onset of symptoms was gradual starting 2 years ago with gradually worsening course since that time.The patient noted no past surgery on the right hip(s).  Patient currently rates pain in the right hip at 10 out of 10 with activity. Patient has night pain, worsening of pain with activity and weight bearing, trendelenberg gait, pain that interfers with activities of daily living and pain with passive range of motion. Patient has evidence of subchondral cysts, subchondral sclerosis, periarticular osteophytes and joint space narrowing by imaging studies. This condition presents safety issues increasing the risk of falls.  There is no current active infection.  Patient Active Problem List   Diagnosis Date Noted  . Unilateral primary osteoarthritis, right hip 09/11/2017  . Papilloma of right breast   . Neuralgia, post-herpetic 11/13/2016  . Elevated blood sugar 11/13/2016  . History of colonic polyps 10/05/2016  . HLD (hyperlipidemia) 09/10/2016  . Essential hypertension 09/10/2016  . Primary vitiligo 09/10/2016  . Osteoarthritis of knee 09/10/2016  . Chronic lower back pain 09/10/2016   Past Medical History:  Diagnosis Date  . Arthritis    knee , ankle and back  . Bronchitis   . Cataract   . Hyperlipidemia   . Hypertension   . Neuralgia, post-herpetic 11/13/2016  . Thyroid disease     Past Surgical History:  Procedure Laterality Date  . ABDOMINAL HYSTERECTOMY     fibroid  . BREAST BIOPSY Right 07/31/2017   Procedure: EXCISIONAL BREAST BIOPSY WITH NEEDLE LOCALIZATION;  Surgeon: Virl Cagey, MD;  Location: [facility] ORS;  Service: General;  Laterality: Right;  . BREAST SURGERY Right   . COLONOSCOPY  09/13/2011   Procedure: COLONOSCOPY;  Surgeon: Rogene Houston, MD;  Location: [facility] ENDO SUITE;  Service: Endoscopy;  Laterality: N/A;  1030    Current Facility-Administered Medications  Medication Dose Route Frequency Provider Last Rate Last Dose  . ceFAZolin (ANCEF) IVPB 2g/100 mL premix  2 g Intravenous On Call to OR Pete Pelt, PA-C      . chlorhexidine (HIBICLENS) 4 % liquid 4 application  60 mL Topical Once Pete Pelt, PA-C      . lactated ringers infusion   Intravenous Continuous Roberts Gaudy, MD 20 mL/hr at 12/13/17 0557    . tranexamic acid (CYKLOKAPRON) 1,000 mg in sodium chloride 0.9 % 100 mL IVPB  1,000 mg Intravenous To OR Pete Pelt, PA-C       Allergies  Allergen Reactions  . Lisinopril Swelling and Other (See Comments)    Lips swelled  . Naproxen Itching and Other (See Comments)    "hurts my stomach"    Social History   Tobacco Use  . Smoking status: Former Smoker    Packs/day: 0.50    Years: 3.00    Pack years: 1.50    Types: Cigarettes    Last attempt to quit: 07/20/2002    Years since quitting: 15.4  . Smokeless tobacco: Never Used  Substance Use Topics  . Alcohol use: No    Family History  Problem Relation Age of Onset  .  Stroke Mother   . Diabetes Mother   . Alcohol abuse Father   . Early death Father   . Early death Sister        pneumonia  . Cancer Brother   . Alzheimer's disease Sister   . COPD Brother   . Colon cancer Neg Hx      Review of Systems  Musculoskeletal: Positive for joint pain.  All other systems reviewed and are negative.   Objective:  Physical Exam  Constitutional: She is oriented to person, place, and time. She appears well-developed and well-nourished.  HENT:  Head: Normocephalic and atraumatic.  Eyes: Pupils are  equal, round, and reactive to light. EOM are normal.  Neck: Normal range of motion. Neck supple.  Cardiovascular: Normal rate and regular rhythm.  Respiratory: Effort normal and breath sounds normal.  GI: Soft. Bowel sounds are normal.  Musculoskeletal:       Right hip: She exhibits decreased range of motion, decreased strength, tenderness and bony tenderness.  Neurological: She is alert and oriented to person, place, and time.  Skin: Skin is warm and dry.  Psychiatric: She has a normal mood and affect.    Vital signs in last 24 hours: Weight:  [214 lb (97.1 kg)] 214 lb (97.1 kg) (07/26 0556)  Labs:   Estimated body mass index is 35.61 kg/m as calculated from the following:   Height as of this encounter: 5\' 5"  (1.651 m).   Weight as of this encounter: 214 lb (97.1 kg).   Imaging Review Plain radiographs demonstrate severe degenerative joint disease of the right hip(s). The bone quality appears to be excellent for age and reported activity level.    Preoperative templating of the joint replacement has been completed, documented, and submitted to the Operating Room personnel in order to optimize intra-operative equipment management.     Assessment/Plan:  End stage arthritis, right hip(s)  The patient history, physical examination, clinical judgement of the provider and imaging studies are consistent with end stage degenerative joint disease of the right hip(s) and total hip arthroplasty is deemed medically necessary. The treatment options including medical management, injection therapy, arthroscopy and arthroplasty were discussed at length. The risks and benefits of total hip arthroplasty were presented and reviewed. The risks due to aseptic loosening, infection, stiffness, dislocation/subluxation,  thromboembolic complications and other imponderables were discussed.  The patient acknowledged the explanation, agreed to proceed with the plan and consent was signed. Patient is being  admitted for inpatient treatment for surgery, pain control, PT, OT, prophylactic antibiotics, VTE prophylaxis, progressive ambulation and ADL's and discharge planning.The patient is planning to be discharged home with home health services

## 2017-12-13 NOTE — Evaluation (Signed)
Physical Therapy Evaluation Patient Details Name: Jeanette Compton MRN: [id number] DOB: Sep 28, 1955 Today's Date: 12/13/2017   History of Present Illness  s/p R THA anterior approach on 12/13/17. PMH includes HTN, OA, LBP.   Clinical Impression  Pt is a 62 YO female s/p R THA anterior approach on 12/13/17. Pt presents with R hip pain, decreased activity tolerance, and difficulty ambulating, which are limiting the pt from returning to PLOF. Acute PT indicated to address these deficits. Will continue to follow over the weekend.     Follow Up Recommendations Follow surgeon's recommendation for DC plan and follow-up therapies    Equipment Recommendations  Rolling walker with 5" wheels    Recommendations for Other Services       Precautions / Restrictions Precautions Precautions: Anterior Hip Required Braces or Orthoses: Knee Immobilizer - Right Knee Immobilizer - Right: Discontinue once straight leg raise with < 10 degree lag Restrictions Weight Bearing Restrictions: No Other Position/Activity Restrictions: WBAT       Mobility  Bed Mobility Overal bed mobility: Needs Assistance Bed Mobility: Supine to Sit;Sit to Supine     Supine to sit: Min assist Sit to supine: Min assist   General bed mobility comments: min assist for LE management, especially moving sit to supine, and pelvic translation to EOB.   Transfers Overall transfer level: Needs assistance Equipment used: Rolling walker (2 wheeled) Transfers: Sit to/from Stand Sit to Stand: Min assist         General transfer comment: verbal cues for power up from bed and hand placement. Min assist for power up and steadying upon standing.   Ambulation/Gait Ambulation/Gait assistance: Min guard Gait Distance (Feet): 80 Feet Assistive device: Rolling walker (2 wheeled) Gait Pattern/deviations: Step-to pattern;Decreased step length - right;Decreased step length - left;Decreased stance time - right;Decreased weight shift to  right Gait velocity: decreased       Stairs            Wheelchair Mobility    Modified Rankin (Stroke Patients Only)       Balance Overall balance assessment: No apparent balance deficits (not formally assessed)                                           Pertinent Vitals/Pain Pain Assessment: 0-10 Pain Score: 6  Pain Location: R hip  Pain Descriptors / Indicators: Grimacing;Aching;Sore Pain Intervention(s): Limited activity within patient's tolerance;Ice applied;Monitored during session;Premedicated before session    Home Living Family/patient expects to be discharged to:: Private residence Living Arrangements: Other relatives(grandchildren ) Available Help at Discharge: Family;Available 24 hours/day Type of Home: House Home Access: Stairs to enter Entrance Stairs-Rails: Right Entrance Stairs-Number of Steps: 5 Home Layout: One level Home Equipment: Cane - single point;Crutches      Prior Function Level of Independence: Independent         Comments: R hip pain for the past year. Pt reports not working. Pt has 9 grandchildren, 1 of which lives with her.      Hand Dominance        Extremity/Trunk Assessment   Upper Extremity Assessment Upper Extremity Assessment: Overall WFL for tasks assessed    Lower Extremity Assessment Lower Extremity Assessment: Overall WFL for tasks assessed       Communication   Communication: No difficulties  Cognition Arousal/Alertness: Awake/alert Behavior During Therapy: WFL for tasks assessed/performed Overall Cognitive Status: Within Functional  Limits for tasks assessed                                        General Comments      Exercises Total Joint Exercises Ankle Circles/Pumps: AROM;20 reps   Assessment/Plan    PT Assessment Patient needs continued PT services  PT Problem List Decreased mobility;Decreased activity tolerance;Pain       PT Treatment Interventions DME  instruction;Therapeutic activities;Gait training;Therapeutic exercise;Stair training;Patient/family education    PT Goals (Current goals can be found in the Care Plan section)  Acute Rehab PT Goals Patient Stated Goal: progress mobility (transfers, bed mobility, ambulation, stair use)  PT Goal Formulation: With patient/family Time For Goal Achievement: 12/20/17 Potential to Achieve Goals: Good    Frequency 7X/week   Barriers to discharge        Co-evaluation               AM-PAC PT "6 Clicks" Daily Activity  Outcome Measure Difficulty turning over in bed (including adjusting bedclothes, sheets and blankets)?: A Little Difficulty moving from lying on back to sitting on the side of the bed? : Unable Difficulty sitting down on and standing up from a chair with arms (e.g., wheelchair, bedside commode, etc,.)?: Unable Help needed moving to and from a bed to chair (including a wheelchair)?: A Little Help needed walking in hospital room?: A Little Help needed climbing 3-5 steps with a railing? : A Lot 6 Click Score: 13    End of Session Equipment Utilized During Treatment: Gait belt;Right knee immobilizer Activity Tolerance: Patient tolerated treatment well;No increased pain;Patient limited by fatigue;Patient limited by pain Patient left: in bed;with family/visitor present;with call bell/phone within reach;with SCD's reapplied Nurse Communication: Mobility status PT Visit Diagnosis: Difficulty in walking, not elsewhere classified (R26.2);Muscle weakness (generalized) (M62.81)    Time: 6803-2122 PT Time Calculation (min) (ACUTE ONLY): 32 min   Charges:   PT Evaluation $PT Eval Low Complexity: 1 Low PT Treatments $Gait Training: 8-22 mins        Ioanna Colquhoun D Tomeeka Plaugher PT DPT    Fahima Cifelli D Arwyn Besaw 12/13/2017, 4:38 PM

## 2017-12-13 NOTE — Anesthesia Postprocedure Evaluation (Signed)
Anesthesia Post Note  Patient: Jeanette Compton  Procedure(s) Performed: RIGHT TOTAL HIP ARTHROPLASTY ANTERIOR APPROACH (Right Hip)     Patient location during evaluation: PACU Anesthesia Type: Spinal Level of consciousness: oriented and awake and alert Pain management: pain level controlled Vital Signs Assessment: post-procedure vital signs reviewed and stable Respiratory status: spontaneous breathing, respiratory function stable and patient connected to nasal cannula oxygen Cardiovascular status: blood pressure returned to baseline and stable Postop Assessment: no headache, no backache, no apparent nausea or vomiting and spinal receding Anesthetic complications: no    Last Vitals:  Vitals:   12/13/17 1136 12/13/17 1245  BP: 139/79 134/79  Pulse: (!) 59 78  Resp: 16 14  Temp: 36.4 C 36.8 C  SpO2: 100% 100%    Last Pain:  Vitals:   12/13/17 1245  TempSrc: Oral  PainSc:                  Timm Bonenberger P Klein Willcox

## 2017-12-13 NOTE — Plan of Care (Signed)
  Problem: Health Behavior/Discharge Planning: Goal: Ability to manage health-related needs will improve Outcome: Progressing   Problem: Clinical Measurements: Goal: Ability to maintain clinical measurements within normal limits will improve Outcome: Progressing   Problem: Clinical Measurements: Goal: Will remain free from infection 12/13/2017 1655 by Hubert Azure, RN Outcome: Progressing 12/13/2017 1558 by Hubert Azure, RN Outcome: Progressing   Problem: Clinical Measurements: Goal: Diagnostic test results will improve 12/13/2017 1655 by Hubert Azure, RN Outcome: Progressing 12/13/2017 1558 by Hubert Azure, RN Outcome: Progressing   Problem: Clinical Measurements: Goal: Respiratory complications will improve 12/13/2017 1655 by Hubert Azure, RN Outcome: Progressing 12/13/2017 1558 by Hubert Azure, RN Outcome: Progressing

## 2017-12-13 NOTE — Brief Op Note (Signed)
12/13/2017  8:34 AM  PATIENT:  Jeanette Compton  62 y.o. female  PRE-OPERATIVE DIAGNOSIS:  osteoarthritis right hip  POST-OPERATIVE DIAGNOSIS:  osteoarthritis right hip  PROCEDURE:  Procedure(s): RIGHT TOTAL HIP ARTHROPLASTY ANTERIOR APPROACH (Right)  SURGEON:  Surgeon(s) and Role:    Mcarthur Rossetti, MD - Primary  PHYSICIAN ASSISTANT: Benita Stabile, PA-C  ANESTHESIA:   spinal  EBL:  200 mL   COUNTS:  YES  DICTATION: .Other Dictation: Dictation Number [id number]  PLAN OF CARE: Admit to inpatient   PATIENT DISPOSITION:  PACU - hemodynamically stable.   Delay start of Pharmacological VTE agent (>24hrs) due to surgical blood loss or risk of bleeding: no

## 2017-12-13 NOTE — Anesthesia Procedure Notes (Signed)
Spinal  Patient location during procedure: OR Start time: 12/13/2017 7:15 AM End time: 12/13/2017 7:22 AM Reason for block: at surgeon's request Staffing Resident/CRNA: Anne Fu, CRNA Performed: resident/CRNA  Preanesthetic Checklist Completed: patient identified, site marked, surgical consent, pre-op evaluation, timeout performed, IV checked, risks and benefits discussed and monitors and equipment checked Spinal Block Patient position: sitting Prep: DuraPrep Patient monitoring: heart rate, continuous pulse ox and blood pressure Approach: midline Location: L2-3 Injection technique: single-shot Needle Needle type: Pencan  Needle gauge: 24 G Needle length: 9 cm Assessment Sensory level: T6 Additional Notes Expiration date of kit checked and confirmed. Patient tolerated procedure well, without complications. X 2 attempt with noted clear CSF return.  Initial right paramedian converted to midline with success. Loss of motor and sensory on exam post injection.

## 2017-12-13 NOTE — Anesthesia Preprocedure Evaluation (Addendum)
Anesthesia Evaluation  Patient identified by MRN, date of birth, ID band Patient awake    Reviewed: Allergy & Precautions, NPO status , Patient's Chart, lab work & pertinent test results  Airway Mallampati: II  TM Distance: >3 FB Neck ROM: Full    Dental  (+) Lower Dentures, Upper Dentures   Pulmonary former smoker,    Pulmonary exam normal breath sounds clear to auscultation       Cardiovascular hypertension, Pt. on medications Normal cardiovascular exam Rhythm:Regular Rate:Normal  ECG: NSR, rate 69   Neuro/Psych negative neurological ROS  negative psych ROS   GI/Hepatic negative GI ROS, Neg liver ROS,   Endo/Other  negative endocrine ROS  Renal/GU negative Renal ROS     Musculoskeletal  (+) Arthritis , Osteoarthritis,    Abdominal (+) + obese,   Peds  Hematology HLD   Anesthesia Other Findings osteoarthritis right hip  Reproductive/Obstetrics                            Anesthesia Physical Anesthesia Plan  ASA: II  Anesthesia Plan: Spinal   Post-op Pain Management:    Induction: Intravenous  PONV Risk Score and Plan: 2 and Ondansetron, Dexamethasone, Treatment may vary due to age or medical condition and Midazolam  Airway Management Planned: Nasal Cannula  Additional Equipment:   Intra-op Plan:   Post-operative Plan:   Informed Consent: I have reviewed the patients History and Physical, chart, labs and discussed the procedure including the risks, benefits and alternatives for the proposed anesthesia with the patient or authorized representative who has indicated his/her understanding and acceptance.   Dental advisory given  Plan Discussed with: CRNA  Anesthesia Plan Comments:        Anesthesia Quick Evaluation

## 2017-12-14 LAB — BASIC METABOLIC PANEL
ANION GAP: 10 (ref 5–15)
BUN: 12 mg/dL (ref 8–23)
CO2: 28 mmol/L (ref 22–32)
Calcium: 9.1 mg/dL (ref 8.9–10.3)
Chloride: 103 mmol/L (ref 98–111)
Creatinine, Ser: 0.93 mg/dL (ref 0.44–1.00)
GFR calc Af Amer: >60 mL/min (ref >60)
GFR calc non Af Amer: >60 mL/min (ref >60)
GLUCOSE: 139 mg/dL — AB (ref 70–99)
Potassium: 4 mmol/L (ref 3.5–5.1)
Sodium: 141 mmol/L (ref 135–145)

## 2017-12-14 LAB — CBC
HEMATOCRIT: 36.8 % (ref 36.0–46.0)
Hemoglobin: 11.7 g/dL — ABNORMAL LOW (ref 12.0–15.0)
MCH: 27.8 pg (ref 26.0–34.0)
MCHC: 31.8 g/dL (ref 30.0–36.0)
MCV: 87.4 fL (ref 78.0–100.0)
Platelets: 300 10*3/uL (ref 150–400)
RBC: 4.21 MIL/uL (ref 3.87–5.11)
RDW: 14.6 % (ref 11.5–15.5)
WBC: 12.8 10*3/uL — ABNORMAL HIGH (ref 4.0–10.5)

## 2017-12-14 MED ORDER — ASPIRIN 81 MG PO CHEW: 81.0000 mg | CHEWABLE_TABLET | Freq: Two times a day (BID) | ORAL | 0 refills | Status: DC

## 2017-12-14 MED ORDER — METHOCARBAMOL 500 MG PO TABS: 500.0000 mg | ORAL_TABLET | Freq: Four times a day (QID) | ORAL | 0 refills | Status: DC | PRN

## 2017-12-14 MED ORDER — OXYCODONE HCL 5 MG PO TABS: 5.0000 mg | ORAL_TABLET | ORAL | 0 refills | Status: DC | PRN

## 2017-12-14 NOTE — Progress Notes (Signed)
   Subjective: 1 Day Post-Op Procedure(s) (LRB): RIGHT TOTAL HIP ARTHROPLASTY ANTERIOR APPROACH (Right) Patient reports pain as moderate.    Objective: Vital signs in last 24 hours: Temp:  [97.5 F (36.4 C)-98.6 F (37 C)] 98.6 F (37 C) (07/27 0539) Pulse Rate:  [56-84] 66 (07/27 0539) Resp:  [14-16] 16 (07/27 0539) BP: (106-144)/(62-88) 118/71 (07/27 0539) SpO2:  [97 %-100 %] 97 % (07/27 0539)  Intake/Output from previous day: 07/26 0701 - 07/27 0700 In: 4630 [P.O.:720; I.V.:3150; IV Piggyback:760] Out: 3500 [Urine:3300; Blood:200] Intake/Output this shift: Total I/O In: 120 [P.O.:120] Out: -   Recent Labs    12/14/17 0517  HGB 11.7*   Recent Labs    12/14/17 0517  WBC 12.8*  RBC 4.21  HCT 36.8  PLT 300   Recent Labs    12/14/17 0517  NA 141  K 4.0  CL 103  CO2 28  BUN 12  CREATININE 0.93  GLUCOSE 139*  CALCIUM 9.1   No results for input(s): LABPT, INR in the last 72 hours.  Neurologically intact No results found.  Assessment/Plan: 1 Day Post-Op Procedure(s) (LRB): RIGHT TOTAL HIP ARTHROPLASTY ANTERIOR APPROACH (Right) Up with therapy, possible home tomorrow.   Jeanette Compton 12/14/2017, 10:04 AM

## 2017-12-14 NOTE — Discharge Instructions (Signed)

## 2017-12-14 NOTE — Progress Notes (Signed)
OT Cancellation Note  Patient Details Name: TRISTYN PHARRIS MRN: [id number] DOB: [birth year]   Cancelled Treatment:    Reason Eval/Treat Not Completed: Other (comment)  Discussed pt with PT. Will provide OT education tomorrow prior to Dc as pt is feeling better.  Kari Baars, [place]  Payton Mccallum D 12/14/2017, 3:08 PM

## 2017-12-14 NOTE — Progress Notes (Signed)
Physical Therapy Treatment Patient Details Name: Jeanette Compton MRN: [id number] DOB: 11-26-1955 Today's Date: 12/14/2017    History of Present Illness s/p R THA anterior approach on 12/13/17. PMH includes HTN, OA, LBP.     PT Comments    Pt continues motivated to progress but ltd this pm by nausea.   Follow Up Recommendations  Follow surgeon's recommendation for DC plan and follow-up therapies     Equipment Recommendations  Rolling walker with 5" wheels    Recommendations for Other Services       Precautions / Restrictions Precautions Precautions: Anterior Hip Required Braces or Orthoses: Knee Immobilizer - Right Knee Immobilizer - Right: Discontinue once straight leg raise with < 10 degree lag Restrictions Weight Bearing Restrictions: No RLE Weight Bearing: Weight bearing as tolerated    Mobility  Bed Mobility Overal bed mobility: Needs Assistance Bed Mobility: Sit to Supine     Supine to sit: Min assist Sit to supine: Min assist   General bed mobility comments: cues for sequence and use of L LE to self assist  Transfers Overall transfer level: Needs assistance Equipment used: Rolling walker (2 wheeled) Transfers: Sit to/from [employer] Sit to Stand: Min assist Stand pivot transfers: Min assist       General transfer comment: verbal cues for power up from bed and hand placement. Min assist for power up and steadying upon standing.   Ambulation/Gait Ambulation/Gait assistance: Min guard Gait Distance (Feet): 75 Feet Assistive device: Rolling walker (2 wheeled) Gait Pattern/deviations: Step-to pattern;Decreased step length - right;Decreased step length - left;Decreased stance time - right;Decreased weight shift to right Gait velocity: decreased    General Gait Details: cues for sequence, posture and position from RW; distance ltd by onset nausea   Stairs             Wheelchair Mobility    Modified Rankin (Stroke Patients Only)       Balance Overall balance assessment: No apparent balance deficits (not formally assessed)                                          Cognition Arousal/Alertness: Awake/alert Behavior During Therapy: WFL for tasks assessed/performed Overall Cognitive Status: Within Functional Limits for tasks assessed                                        Exercises Total Joint Exercises Ankle Circles/Pumps: AROM;20 reps Quad Sets: AROM;Both;10 reps;Supine Heel Slides: AAROM;Right;20 reps;Supine Hip ABduction/ADduction: AAROM;Right;15 reps;Supine    General Comments        Pertinent Vitals/Pain Pain Assessment: 0-10 Pain Score: 5  Pain Location: R hip  Pain Descriptors / Indicators: Aching;Sore Pain Intervention(s): Limited activity within patient's tolerance;Monitored during session;Premedicated before session;Ice applied    Home Living                      Prior Function            PT Goals (current goals can now be found in the care plan section) Acute Rehab PT Goals Patient Stated Goal: progress mobility (transfers, bed mobility, ambulation, stair use)  PT Goal Formulation: With patient/family Time For Goal Achievement: 12/20/17 Potential to Achieve Goals: Good Progress towards PT goals: Progressing toward goals    Frequency  7X/week      PT Plan Current plan remains appropriate    Co-evaluation              AM-PAC PT "6 Clicks" Daily Activity  Outcome Measure  Difficulty turning over in bed (including adjusting bedclothes, sheets and blankets)?: A Little Difficulty moving from lying on back to sitting on the side of the bed? : Unable Difficulty sitting down on and standing up from a chair with arms (e.g., wheelchair, bedside commode, etc,.)?: Unable Help needed moving to and from a bed to chair (including a wheelchair)?: A Little Help needed walking in hospital room?: A Little Help needed climbing 3-5 steps with  a railing? : A Lot 6 Click Score: 13    End of Session Equipment Utilized During Treatment: Gait belt Activity Tolerance: Other (comment)(ltd by nausea) Patient left: in bed;with call bell/phone within reach;with family/visitor present Nurse Communication: Mobility status PT Visit Diagnosis: Difficulty in walking, not elsewhere classified (R26.2);Muscle weakness (generalized) (M62.81)     Time: 1400-1420 PT Time Calculation (min) (ACUTE ONLY): 20 min  Charges:  $Gait Training: 8-22 mins $Therapeutic Exercise: 8-22 mins                     Pg [phone]    Awesome Jared 12/14/2017, 2:38 PM

## 2017-12-14 NOTE — Care Management Note (Signed)
Case Management Note  Patient Details  Name: Jeanette Compton MRN: [id number] Date of Birth: 03/05/1956  Subjective/Objective:   Right THA                 Action/Plan: NCM spoke to pt and offered choice for [facility]. Pt agreeable to [facility]. (preoperatively arranged by surgeon's office). Pt requested RW and 3n1 bc for home. Contacted [facility] for DME to be delivered to room prior to dc.   Expected Discharge Date:                 Expected Discharge Plan:  [place]  In-House Referral:  NA  Discharge planning Services  CM Consult  Post Acute Care Choice:  Home Health Choice offered to:  Patient  DME Arranged:  3-N-1, Walker rolling DME Agency:  [place]:  PT [place]:  [facility]  Status of Service:  Completed, signed off  If discussed at [employer], dates discussed:    Additional Comments:  Erenest Rasher, RN 12/14/2017, 11:37 AM

## 2017-12-14 NOTE — Progress Notes (Signed)
Physical Therapy Treatment Patient Details Name: Jeanette Compton MRN: [id number] DOB: [birth year] Today's Date: 12/14/2017    History of Present Illness s/p R THA anterior approach on 12/13/17. PMH includes HTN, OA, LBP.     PT Comments    Pt motivated and progressing with mobility despite ongoing nausea.   Follow Up Recommendations  Follow surgeon's recommendation for DC plan and follow-up therapies     Equipment Recommendations  Rolling walker with 5" wheels    Recommendations for Other Services       Precautions / Restrictions Restrictions Weight Bearing Restrictions: No RLE Weight Bearing: Weight bearing as tolerated    Mobility  Bed Mobility Overal bed mobility: Needs Assistance Bed Mobility: Supine to Sit     Supine to sit: Min assist     General bed mobility comments: cues for sequence and use of L LE to self assist  Transfers Overall transfer level: Needs assistance Equipment used: Rolling walker (2 wheeled) Transfers: Sit to/from Stand Sit to Stand: Min assist         General transfer comment: verbal cues for power up from bed and hand placement. Min assist for power up and steadying upon standing.   Ambulation/Gait Ambulation/Gait assistance: Min guard Gait Distance (Feet): 120 Feet Assistive device: Rolling walker (2 wheeled) Gait Pattern/deviations: Step-to pattern;Decreased step length - right;Decreased step length - left;Decreased stance time - right;Decreased weight shift to right Gait velocity: decreased    General Gait Details: cues for sequence, posture and position from [employer]             Wheelchair Mobility    Modified Rankin (Stroke Patients Only)       Balance Overall balance assessment: No apparent balance deficits (not formally assessed)                                          Cognition Arousal/Alertness: Awake/alert Behavior During Therapy: WFL for tasks assessed/performed Overall Cognitive  Status: Within Functional Limits for tasks assessed                                        Exercises Total Joint Exercises Ankle Circles/Pumps: AROM;20 reps Quad Sets: AROM;Both;10 reps;Supine Heel Slides: AAROM;Right;20 reps;Supine Hip ABduction/ADduction: AAROM;Right;15 reps;Supine    General Comments        Pertinent Vitals/Pain Pain Assessment: 0-10 Pain Score: 5  Pain Location: R hip  Pain Descriptors / Indicators: Aching;Sore Pain Intervention(s): Limited activity within patient's tolerance;Monitored during session;Premedicated before session;Ice applied    Home Living                      Prior Function            PT Goals (current goals can now be found in the care plan section) Acute Rehab PT Goals Patient Stated Goal: progress mobility (transfers, bed mobility, ambulation, stair use)  PT Goal Formulation: With patient/family Time For Goal Achievement: 12/20/17 Potential to Achieve Goals: Good Progress towards PT goals: Progressing toward goals    Frequency    7X/week      PT Plan Current plan remains appropriate    Co-evaluation              AM-PAC PT "6 Clicks" Daily Activity  Outcome Measure  Difficulty turning over in bed (including adjusting bedclothes, sheets and blankets)?: A Little Difficulty moving from lying on back to sitting on the side of the bed? : Unable Difficulty sitting down on and standing up from a chair with arms (e.g., wheelchair, bedside commode, etc,.)?: Unable Help needed moving to and from a bed to chair (including a wheelchair)?: A Little Help needed walking in hospital room?: A Little Help needed climbing 3-5 steps with a railing? : A Lot 6 Click Score: 13    End of Session Equipment Utilized During Treatment: Gait belt Activity Tolerance: Patient tolerated treatment well Patient left: in chair;with call bell/phone within reach Nurse Communication: Mobility status PT Visit Diagnosis:  Difficulty in walking, not elsewhere classified (R26.2);Muscle weakness (generalized) (M62.81)     Time: 4492-0100 PT Time Calculation (min) (ACUTE ONLY): 35 min  Charges:  $Gait Training: 8-22 mins $Therapeutic Exercise: 8-22 mins                     Pg [phone]    Nance Mccombs 12/14/2017, 1:00 PM

## 2017-12-15 NOTE — Progress Notes (Addendum)
Physical Therapy Treatment Patient Details Name: Jeanette Compton MRN: [id number] DOB: April 09, 1956 Today's Date: 12/15/2017    History of Present Illness s/p R THA anterior approach on 12/13/17. PMH includes HTN, OA, LBP.     PT Comments    Pt continues to progress well. Reviewed exercises, gait training, and stair negotiation training. All education completed. Okay to d/c from PT standpoint.    Follow Up Recommendations  Follow surgeon's recommendation for DC plan and follow-up therapies     Equipment Recommendations  Rolling walker with 5" wheels    Recommendations for Other Services       Precautions / Restrictions Precautions Precautions: Fall Restrictions Weight Bearing Restrictions: No RLE Weight Bearing: Weight bearing as tolerated    Mobility  Bed Mobility Overal bed mobility: Needs Assistance Bed Mobility: Supine to Sit     Supine to sit: Supervision     General bed mobility comments: oob in recliner  Transfers Overall transfer level: Needs assistance Equipment used: Rolling walker (2 wheeled) Transfers: Sit to/from Stand Sit to Stand: Supervision Stand pivot transfers: Supervision       General transfer comment: Vc for hand placement. Increased time.   Ambulation/Gait Ambulation/Gait assistance: Min guard Gait Distance (Feet): 60 Feet Assistive device: Rolling walker (2 wheeled) Gait Pattern/deviations: Step-to pattern;Antalgic;Decreased stance time - right;Decreased step length - left     General Gait Details: close guard for safety. slow gait speed.    Stairs Stairs: Yes Stairs assistance: Min assist Stair Management: Step to pattern;One rail Right;With cane Number of Stairs: 2 General stair comments: up and over portable steps. VCs safety, technique, sequence. Assist to steady.    Wheelchair Mobility    Modified Rankin (Stroke Patients Only)       Balance Overall balance assessment: No apparent balance deficits (not formally  assessed)                                          Cognition Arousal/Alertness: Awake/alert Behavior During Therapy: WFL for tasks assessed/performed Overall Cognitive Status: Within Functional Limits for tasks assessed                                        Exercises Total Joint Exercises Ankle Circles/Pumps: AROM;Both;10 reps;Supine Quad Sets: AROM;Both;10 reps;Supine Heel Slides: AAROM;Right;10 reps;Supine Hip ABduction/ADduction: AAROM;Right;10 reps;Supine     General Comments        Pertinent Vitals/Pain Pain Assessment: 0-10 Pain Score: 5  Pain Location: R hip/thigh Pain Descriptors / Indicators: Aching;Sore Pain Intervention(s): Ice applied;Monitored during session;Repositioned    Home Living Family/patient expects to be discharged to:: Private residence Living Arrangements: Other relatives(grandchildren ) Available Help at Discharge: Family;Available 24 hours/day Type of Home: House Home Access: Stairs to enter Entrance Stairs-Rails: Right Home Layout: One level Home Equipment: Cane - single point;Crutches      Prior Function Level of Independence: Independent      Comments: R hip pain for the past year. Pt reports not working. Pt has 9 grandchildren, 1 of which lives with her.    PT Goals (current goals can now be found in the care plan section) Progress towards PT goals: Progressing toward goals    Frequency    7X/week      PT Plan Current plan remains appropriate    Co-evaluation  AM-PAC PT "6 Clicks" Daily Activity  Outcome Measure  Difficulty turning over in bed (including adjusting bedclothes, sheets and blankets)?: Unable Difficulty moving from lying on back to sitting on the side of the bed? : Unable Difficulty sitting down on and standing up from a chair with arms (e.g., wheelchair, bedside commode, etc,.)?: A Little Help needed moving to and from a bed to chair (including a  wheelchair)?: A Little Help needed walking in hospital room?: A Little Help needed climbing 3-5 steps with a railing? : A Little 6 Click Score:     End of Session Equipment Utilized During Treatment: Gait belt;Right knee immobilizer Activity Tolerance: Patient limited by pain Patient left: in chair;with call bell/phone within reach   PT Visit Diagnosis: Difficulty in walking, not elsewhere classified (R26.2);Muscle weakness (generalized) (M62.81)     Time: 7169-6789 PT Time Calculation (min) (ACUTE ONLY): 11 min  Charges:  $Gait Training: 8-22 mins                        Weston Anna, MPT Pager: [phone]

## 2017-12-15 NOTE — Progress Notes (Signed)
Dc paperwork given and explained in detail to patient; patient verbalizes understanding. Escorted to lobby in wheelchair to be discharged home with son.

## 2017-12-15 NOTE — Progress Notes (Signed)
   Subjective: 2 Days Post-Op Procedure(s) (LRB): RIGHT TOTAL HIP ARTHROPLASTY ANTERIOR APPROACH (Right) Patient reports pain as mild.    Objective: Vital signs in last 24 hours: Temp:  [98 F (36.7 C)-99.1 F (37.3 C)] 99.1 F (37.3 C) (07/27 2137) Pulse Rate:  [67-79] 79 (07/27 2137) Resp:  [14-16] 16 (07/27 2137) BP: (122-130)/(61-71) 126/66 (07/27 2137) SpO2:  [96 %-100 %] 97 % (07/27 2137)  Intake/Output from previous day: 07/27 0701 - 07/28 0700 In: 822.5 [P.O.:480; I.V.:342.5] Out: 750 [Urine:750] Intake/Output this shift: No intake/output data recorded.  Recent Labs    12/14/17 0517  HGB 11.7*   Recent Labs    12/14/17 0517  WBC 12.8*  RBC 4.21  HCT 36.8  PLT 300   Recent Labs    12/14/17 0517  NA 141  K 4.0  CL 103  CO2 28  BUN 12  CREATININE 0.93  GLUCOSE 139*  CALCIUM 9.1   No results for input(s): LABPT, INR in the last 72 hours.  Neurologically intact No results found.  Assessment/Plan: 2 Days Post-Op Procedure(s) (LRB): RIGHT TOTAL HIP ARTHROPLASTY ANTERIOR APPROACH (Right) Plan: discharge home.   Jeanette Compton 12/15/2017, 9:23 AM

## 2017-12-15 NOTE — Evaluation (Signed)
Occupational Therapy Evaluation Patient Details Name: Jeanette Compton MRN: [id number] DOB: Sep 22, 1955 Today's Date: 12/15/2017    History of Present Illness s/p R THA anterior approach on 12/13/17. PMH includes HTN, OA, LBP.    Clinical Impression   OT education complete. No further OT needs    Follow Up Recommendations  No OT follow up    Equipment Recommendations  3 in 1 bedside commode    Recommendations for Other Services       Precautions / Restrictions Precautions Precautions: Anterior Hip Restrictions Weight Bearing Restrictions: No RLE Weight Bearing: Weight bearing as tolerated      Mobility Bed Mobility Overal bed mobility: Needs Assistance Bed Mobility: Supine to Sit     Supine to sit: Supervision        Transfers Overall transfer level: Needs assistance Equipment used: Rolling walker (2 wheeled) Transfers: Sit to/from [employer] Transfers Sit to Stand: Supervision Stand pivot transfers: Supervision       General transfer comment: Vc for hand placement    Balance Overall balance assessment: No apparent balance deficits (not formally assessed)                                         ADL either performed or assessed with clinical judgement   ADL Overall ADL's : Needs assistance/impaired Eating/Feeding: Set up;Sitting   Grooming: Supervision/safety;Standing   Upper Body Bathing: Set up;Sitting   Lower Body Bathing: Minimal assistance;Sit to/from stand;Cueing for sequencing;Cueing for safety   Upper Body Dressing : Set up;Sitting   Lower Body Dressing: Minimal assistance;Sit to/from stand;Cueing for sequencing;Cueing for safety   Toilet Transfer: Supervision/safety;RW;Comfort height toilet   Toileting- Clothing Manipulation and Hygiene: Supervision/safety;Sit to/from Nurse, children's Details (indicate cue type and reason): verbalized safety Functional mobility during ADLs: Min guard General ADL  Comments: family will A as needed     Vision Patient Visual Report: No change from baseline       Perception     Praxis      Pertinent Vitals/Pain Pain Score: 3  Pain Location: R hip  Pain Descriptors / Indicators: Aching;Sore Pain Intervention(s): Limited activity within patient's tolerance     Hand Dominance     Extremity/Trunk Assessment             Communication Communication Communication: No difficulties   Cognition Arousal/Alertness: Awake/alert Behavior During Therapy: WFL for tasks assessed/performed Overall Cognitive Status: Within Functional Limits for tasks assessed                                     General Comments       Exercises     Shoulder Instructions      Home Living Family/patient expects to be discharged to:: Private residence Living Arrangements: Other relatives(grandchildren ) Available Help at Discharge: Family;Available 24 hours/day Type of Home: House Home Access: Stairs to enter [employer] of Steps: 5 Entrance Stairs-Rails: Right Home Layout: One level     Bathroom Shower/Tub: Teacher, early years/pre: Standard Bathroom Accessibility: Yes   Home Equipment: Cane - single point;Crutches          Prior Functioning/Environment Level of Independence: Independent        Comments: R hip pain for the past year. Pt reports not working. Pt has  9 grandchildren, 1 of which lives with her.         OT Problem List: Decreased activity tolerance;Pain;Obesity;Decreased knowledge of use of DME or AE       AM-PAC PT "6 Clicks" Daily Activity     Outcome Measure Help from another person eating meals?: None Help from another person taking care of personal grooming?: None Help from another person toileting, which includes using toliet, bedpan, or urinal?: A Little Help from another person bathing (including washing, rinsing, drying)?: A Little Help from another person to put on and taking off  regular upper body clothing?: None Help from another person to put on and taking off regular lower body clothing?: A Little 6 Click Score: 21   End of Session Nurse Communication: Mobility status  Activity Tolerance: Patient tolerated treatment well Patient left: in chair;with call bell/phone within reach  OT Visit Diagnosis: Muscle weakness (generalized) (M62.81)                Time: 8329-1916 OT Time Calculation (min): 24 min Charges:  OT General Charges $OT Visit: 1 Visit OT Evaluation $OT Eval Moderate Complexity: 1 Mod OT Treatments $Self Care/Home Management : 8-22 mins  [place], [place] [phone]  Payton Mccallum D 12/15/2017, 9:38 AM

## 2017-12-16 ENCOUNTER — Encounter (HOSPITAL_COMMUNITY): Payer: Self-pay | Admitting: Orthopaedic Surgery

## 2017-12-16 DIAGNOSIS — I10 Essential (primary) hypertension: Secondary | ICD-10-CM | POA: Diagnosis not present

## 2017-12-16 DIAGNOSIS — L80 Vitiligo: Secondary | ICD-10-CM | POA: Diagnosis not present

## 2017-12-16 DIAGNOSIS — Z7982 Long term (current) use of aspirin: Secondary | ICD-10-CM | POA: Diagnosis not present

## 2017-12-16 DIAGNOSIS — Z96641 Presence of right artificial hip joint: Secondary | ICD-10-CM | POA: Diagnosis not present

## 2017-12-16 DIAGNOSIS — Z87891 Personal history of nicotine dependence: Secondary | ICD-10-CM | POA: Diagnosis not present

## 2017-12-16 DIAGNOSIS — Z471 Aftercare following joint replacement surgery: Secondary | ICD-10-CM | POA: Diagnosis not present

## 2017-12-16 DIAGNOSIS — G8929 Other chronic pain: Secondary | ICD-10-CM | POA: Diagnosis not present

## 2017-12-16 DIAGNOSIS — M1710 Unilateral primary osteoarthritis, unspecified knee: Secondary | ICD-10-CM | POA: Diagnosis not present

## 2017-12-16 DIAGNOSIS — M479 Spondylosis, unspecified: Secondary | ICD-10-CM | POA: Diagnosis not present

## 2017-12-16 DIAGNOSIS — Z86018 Personal history of other benign neoplasm: Secondary | ICD-10-CM | POA: Diagnosis not present

## 2017-12-16 DIAGNOSIS — M19079 Primary osteoarthritis, unspecified ankle and foot: Secondary | ICD-10-CM | POA: Diagnosis not present

## 2017-12-16 DIAGNOSIS — Z86010 Personal history of colonic polyps: Secondary | ICD-10-CM | POA: Diagnosis not present

## 2017-12-16 DIAGNOSIS — B0229 Other postherpetic nervous system involvement: Secondary | ICD-10-CM | POA: Diagnosis not present

## 2017-12-16 NOTE — Op Note (Signed)
NAME: Jeanette Compton, Jeanette Compton MEDICAL RECORD ER:[id number] ACCOUNT [id number] DATE OF BIRTH:06-02-1955 FACILITY: [facility] LOCATION: [facility] PHYSICIAN:Bryelle Spiewak Kerry Fort, MD  OPERATIVE REPORT  DATE OF PROCEDURE:  12/13/2017  PREOPERATIVE DIAGNOSIS:  Primary osteoarthritis and degenerative joint disease, right hip.  POSTOPERATIVE DIAGNOSIS:  Primary osteoarthritis and degenerative joint disease, right hip.  PROCEDURE:  Right total hip arthroplasty through direct anterior approach.  IMPLANTS:  DePuy Sector Gription acetabular component size 50, size 32+0 polyethylene liner.  One screw in the acetabular component.  Size 13 Corail femoral component with varus offset, size 32+1 ceramic hip ball.  SURGEON:  [name], MD  ASSISTANT:  Erskine Emery, PA-C.  ANESTHESIA:  Spinal.  ANTIBIOTICS:  Two grams IV Ancef.  ESTIMATED BLOOD LOSS:  200 mL.  COMPLICATIONS:  None.  INDICATIONS:  The patient is a 62 year old female who is moderately obese with debilitating arthritis involving her right hip.  There is some arthritis in her left hip that is well.  Her right hip pain, though is daily.  It has detrimentally affected  activities of daily living, her quality of life and her mobility.  This has been well documented with x-rays showing severe arthritis in her right hip.  At this point, with failure of conservative treatment, she does wish to proceed with a total hip  arthroplasty through direct anterior approach.  I talked to her about the risk of acute blood loss anemia, nerve or vessel injury, fracture, infection, dislocation, DVT and implant failure and skin issues.  She understands the goals are to decrease pain,  improved mobility and overall improve quality of life.  DESCRIPTION OF PROCEDURE:  After informed consent was obtained and appropriate right hip was marked, she was brought to the operating room.  Spinal anesthesia was obtained while she was on her stretcher.  She was laid in  the supine position on the  stretcher.  I assessed her leg lengths and found them to be equal.  A Foley catheter was placed and then traction boots were placed on both her feet.  Next, she was placed supine on the Hana fracture table with a perineal post in place and both legs in a  line skeletal traction device and no traction applied.  Her right operative hip was prepped and draped with DuraPrep and sterile drapes.  A time-out was called and she was identified as the correct patient, correct right hip.  I then made an incision  just inferior and posterior to the anterior superior iliac spine and carried this obliquely down the leg, dissected down to the tensor fascia lata muscle.  Tensor fascia was then divided longitudinally to proceed with direct anterior approach to the hip.   We identified and cauterized circumflex vessels.  I then identified the hip capsule.  I opened the hip capsule in an L-type format, finding moderate joint effusion and significant periarticular osteophytes around the right hip.  I placed a Cobra  retractor on the medial and lateral femoral neck and then made our femoral neck cut with an oscillating saw just proximal to the lesser trochanter and completed this with an osteotome.  We placed a corkscrew guide in the femoral head and removed the  femoral head in its entirety and found it to be completely devoid of cartilage.  I then placed a bent Hohmann over the medial acetabular rim and removed remnants of the acetabular labrum and other debris.  I then began reaming under direct visualization  from a size 44 reamer and going in stepwise  increments up to a size 49, with all reamers regular under direct visualization, the last reamer under direct fluoroscopy, so I could obtain my depth of reaming my inclination and anteversion.  I then placed  the real DePuy Sector Gription acetabular component size 50 and a single screw.  I placed a 32+0 neutral polyethylene liner for that size 50  acetabular component.  Attention was then turned to the femur.  With the leg externally rotated to 120 degrees, I  extended and adducted, I was able to use a Mueller retractor medially and Homans retractor behind the greater trochanter.  I used a box cutting osteotome to enter the femoral canal and a bone rongeur to lateralize them.  I began broaching using the  Corail broaching system from a size 8 broach, going up to a size 13.  With the 13 in place, we tried a varus offset femoral neck based on her anatomy on plain films and placed a 32+1 trial hip ball.  We brought the right leg back over and upward and with  traction, internal rotation, reducing the pelvis.  We were pleased with the leg length, offset, range of motion and stability.  We then dislocated the hip and removed the trial components.  We were able to place the real size 13 Corail femoral component  with varus offset and the real size 32+0 ceramic hip ball.  We again reduced this in the acetabulum and it was stable.  We then irrigated the soft tissues with normal saline solution using pulsatile lavage.  I closed the joint capsule with interrupted  #1 Ethibond suture, followed by running 0 Vicryl and tensor fascia, 0 Vicryl in the deep tissue, 2-0 Vicryl subcutaneous tissue and interrupted staples on the skin.  Xeroform well-padded sterile dressing was applied.    She was taken off the Hana table and taken to recovery room in stable condition.  All final counts were correct.  There were no complications noted.    Of note, Benita Stabile, PA-C, assisted the entire case.  His assistance was crucial for facilitating all aspects of this case.  AN/NUANCE  D:12/13/2017 T:12/13/2017 JOB:[id number]

## 2017-12-16 NOTE — Discharge Summary (Signed)
Patient ID: KAIMANA NEUZIL MRN: [id number] DOB/AGE: 1956/02/01 62 y.o.  Admit date: 12/13/2017 Discharge date: 12/16/2017  Admission Diagnoses:  Principal Problem:   Unilateral primary osteoarthritis, right hip Active Problems:   Status post total replacement of right hip   Discharge Diagnoses:  Same  Past Medical History:  Diagnosis Date  . Arthritis    knee , ankle and back  . Bronchitis   . Cataract   . Hyperlipidemia   . Hypertension   . Neuralgia, post-herpetic 11/13/2016  . Thyroid disease     Surgeries: Procedure(s): RIGHT TOTAL HIP ARTHROPLASTY ANTERIOR APPROACH on 12/13/2017   Consultants:   Discharged Condition: Improved  Hospital Course: CRYSTALYN DELIA is an 62 y.o. female who was admitted 12/13/2017 for operative treatment ofUnilateral primary osteoarthritis, right hip. Patient has severe unremitting pain that affects sleep, daily activities, and work/hobbies. After pre-op clearance the patient was taken to the operating room on 12/13/2017 and underwent  Procedure(s): RIGHT TOTAL HIP ARTHROPLASTY ANTERIOR APPROACH.    Patient was given perioperative antibiotics:  Anti-infectives (From admission, onward)   Start     Dose/Rate Route Frequency Ordered Stop   12/13/17 1400  ceFAZolin (ANCEF) IVPB 1 g/50 mL premix     1 g 100 mL/hr over 30 Minutes Intravenous Every 6 hours 12/13/17 1051 12/13/17 2107   12/13/17 0600  ceFAZolin (ANCEF) IVPB 2g/100 mL premix     2 g 200 mL/hr over 30 Minutes Intravenous On call to O.R. 12/13/17 0537 12/13/17 0725       Patient was given sequential compression devices, early ambulation, and chemoprophylaxis to prevent DVT.  Patient benefited maximally from hospital stay and there were no complications.    Recent vital signs: No data found.   Recent laboratory studies:  Recent Labs    12/14/17 0517  WBC 12.8*  HGB 11.7*  HCT 36.8  PLT 300  NA 141  K 4.0  CL 103  CO2 28  BUN 12  CREATININE 0.93  GLUCOSE 139*  CALCIUM  9.1     Discharge Medications:   Allergies as of 12/15/2017      Reactions   Lisinopril Swelling, Other (See Comments)   Lips swelled   Naproxen Itching, Other (See Comments)   "hurts my stomach"      Medication List    STOP taking these medications   aspirin EC 81 MG tablet Replaced by:  aspirin 81 MG chewable tablet   HYDROcodone-acetaminophen 5-325 MG tablet Commonly known as:  NORCO/VICODIN     TAKE these medications   aspirin 81 MG chewable tablet Chew 1 tablet (81 mg total) by mouth 2 (two) times daily. Replaces:  aspirin EC 81 MG tablet   cyclobenzaprine 10 MG tablet Commonly known as:  FLEXERIL Take 1 tablet (10 mg total) by mouth 3 (three) times daily.   gabapentin 300 MG capsule Commonly known as:  NEURONTIN Take 300 mg by mouth daily.   HAIR SKIN AND NAILS FORMULA Tabs Take 2 tablets by mouth daily.   losartan-hydrochlorothiazide 50-12.5 MG tablet Commonly known as:  HYZAAR Take 1 tablet by mouth daily.   methocarbamol 500 MG tablet Commonly known as:  ROBAXIN Take 1 tablet (500 mg total) by mouth every 6 (six) hours as needed for muscle spasms.   multivitamin with minerals Tabs tablet Take 1 tablet by mouth daily. One-A-Day   nabumetone 750 MG tablet Commonly known as:  RELAFEN Take 1 tablet (750 mg total) by mouth 2 (two) times daily. One by  mouth twice a day after eating.   oxyCODONE 5 MG immediate release tablet Commonly known as:  Oxy IR/ROXICODONE Take 1-2 tablets (5-10 mg total) by mouth every 4 (four) hours as needed for moderate pain (pain score 4-6).   pravastatin 40 MG tablet Commonly known as:  PRAVACHOL Take 1 tablet (40 mg total) by mouth daily. What changed:  when to take this       Diagnostic Studies: Dg Pelvis Portable  Result Date: 12/13/2017 CLINICAL DATA:  Osteoarthritis of the right hip. Status post right total hip prosthesis insertion. EXAM: PORTABLE PELVIS 1-2 VIEWS COMPARISON:  Radiographs dated 08/28/2017 FINDINGS:  Single AP portable view of the pelvis demonstrates that the components of the right total hip prosthesis appear in excellent position in the AP projection. No fractures. Slight arthritic changes of the left hip. IMPRESSION: Satisfactory appearance of the right hip in the AP projection after total hip prosthesis insertion. Electronically Signed   By: Lorriane Shire M.D.   On: 12/13/2017 09:20   Dg C-arm 1-60 Min-no Report  Result Date: 12/13/2017 Fluoroscopy was utilized by the requesting physician.  No radiographic interpretation.   Dg Hip Operative Unilat W Or W/o Pelvis Right  Result Date: 12/13/2017 CLINICAL DATA:  62 year old female undergoing right anterior hip replacement. EXAM: OPERATIVE RIGHT HIP (WITH PELVIS IF PERFORMED) 2 VIEWS TECHNIQUE: Fluoroscopic spot image(s) were submitted for interpretation post-operatively. COMPARISON:  Right hip series 11/13/2016. FLUOROSCOPY TIME:  0 minutes 12 seconds FINDINGS: 2 intraoperative fluoroscopic AP spot views of the right hip and lower pelvis. Right total hip arthroplasty hardware in place and normally aligned in the AP view. No unexpected osseous changes are evident. Hardware appears intact. IMPRESSION: Right total hip arthroplasty with no adverse features. Electronically Signed   By: Genevie Ann M.D.   On: 12/13/2017 08:53    Disposition:     Follow-up Information    [facility] Follow up.   Specialty:  [place] Why:  [place] will call to arrange appt Contact information: [place] [address] [phone]        [facility] - Dme Follow up.   Why:  [place] Walker and 3n1 bedside commode. will delivered to room prior to discharge Contact information: [address] [place] [address] [phone]        Mcarthur Rossetti, MD Follow up in 2 week(s).   Specialty:  Orthopedic Surgery Contact information: [place] [address] [phone]            Signed: Mcarthur Rossetti 12/16/2017, 9:19 AM

## 2017-12-17 DIAGNOSIS — G8929 Other chronic pain: Secondary | ICD-10-CM | POA: Diagnosis not present

## 2017-12-17 DIAGNOSIS — Z87891 Personal history of nicotine dependence: Secondary | ICD-10-CM | POA: Diagnosis not present

## 2017-12-17 DIAGNOSIS — Z86018 Personal history of other benign neoplasm: Secondary | ICD-10-CM | POA: Diagnosis not present

## 2017-12-17 DIAGNOSIS — Z86010 Personal history of colonic polyps: Secondary | ICD-10-CM | POA: Diagnosis not present

## 2017-12-17 DIAGNOSIS — Z96641 Presence of right artificial hip joint: Secondary | ICD-10-CM | POA: Diagnosis not present

## 2017-12-17 DIAGNOSIS — M19079 Primary osteoarthritis, unspecified ankle and foot: Secondary | ICD-10-CM | POA: Diagnosis not present

## 2017-12-17 DIAGNOSIS — M479 Spondylosis, unspecified: Secondary | ICD-10-CM | POA: Diagnosis not present

## 2017-12-17 DIAGNOSIS — M1710 Unilateral primary osteoarthritis, unspecified knee: Secondary | ICD-10-CM | POA: Diagnosis not present

## 2017-12-17 DIAGNOSIS — Z471 Aftercare following joint replacement surgery: Secondary | ICD-10-CM | POA: Diagnosis not present

## 2017-12-17 DIAGNOSIS — I10 Essential (primary) hypertension: Secondary | ICD-10-CM | POA: Diagnosis not present

## 2017-12-17 DIAGNOSIS — Z7982 Long term (current) use of aspirin: Secondary | ICD-10-CM | POA: Diagnosis not present

## 2017-12-17 DIAGNOSIS — B0229 Other postherpetic nervous system involvement: Secondary | ICD-10-CM | POA: Diagnosis not present

## 2017-12-17 DIAGNOSIS — L80 Vitiligo: Secondary | ICD-10-CM | POA: Diagnosis not present

## 2017-12-24 DIAGNOSIS — G8929 Other chronic pain: Secondary | ICD-10-CM | POA: Diagnosis not present

## 2017-12-24 DIAGNOSIS — L80 Vitiligo: Secondary | ICD-10-CM | POA: Diagnosis not present

## 2017-12-24 DIAGNOSIS — B0229 Other postherpetic nervous system involvement: Secondary | ICD-10-CM | POA: Diagnosis not present

## 2017-12-24 DIAGNOSIS — M19079 Primary osteoarthritis, unspecified ankle and foot: Secondary | ICD-10-CM | POA: Diagnosis not present

## 2017-12-24 DIAGNOSIS — M1710 Unilateral primary osteoarthritis, unspecified knee: Secondary | ICD-10-CM | POA: Diagnosis not present

## 2017-12-24 DIAGNOSIS — Z86010 Personal history of colonic polyps: Secondary | ICD-10-CM | POA: Diagnosis not present

## 2017-12-24 DIAGNOSIS — I10 Essential (primary) hypertension: Secondary | ICD-10-CM | POA: Diagnosis not present

## 2017-12-24 DIAGNOSIS — Z7982 Long term (current) use of aspirin: Secondary | ICD-10-CM | POA: Diagnosis not present

## 2017-12-24 DIAGNOSIS — Z471 Aftercare following joint replacement surgery: Secondary | ICD-10-CM | POA: Diagnosis not present

## 2017-12-24 DIAGNOSIS — Z96641 Presence of right artificial hip joint: Secondary | ICD-10-CM | POA: Diagnosis not present

## 2017-12-24 DIAGNOSIS — Z86018 Personal history of other benign neoplasm: Secondary | ICD-10-CM | POA: Diagnosis not present

## 2017-12-24 DIAGNOSIS — Z87891 Personal history of nicotine dependence: Secondary | ICD-10-CM | POA: Diagnosis not present

## 2017-12-24 DIAGNOSIS — M479 Spondylosis, unspecified: Secondary | ICD-10-CM | POA: Diagnosis not present

## 2017-12-25 DIAGNOSIS — Z86010 Personal history of colonic polyps: Secondary | ICD-10-CM | POA: Diagnosis not present

## 2017-12-25 DIAGNOSIS — G8929 Other chronic pain: Secondary | ICD-10-CM | POA: Diagnosis not present

## 2017-12-25 DIAGNOSIS — M479 Spondylosis, unspecified: Secondary | ICD-10-CM | POA: Diagnosis not present

## 2017-12-25 DIAGNOSIS — Z96641 Presence of right artificial hip joint: Secondary | ICD-10-CM | POA: Diagnosis not present

## 2017-12-25 DIAGNOSIS — L80 Vitiligo: Secondary | ICD-10-CM | POA: Diagnosis not present

## 2017-12-25 DIAGNOSIS — I10 Essential (primary) hypertension: Secondary | ICD-10-CM | POA: Diagnosis not present

## 2017-12-25 DIAGNOSIS — M19079 Primary osteoarthritis, unspecified ankle and foot: Secondary | ICD-10-CM | POA: Diagnosis not present

## 2017-12-25 DIAGNOSIS — M1710 Unilateral primary osteoarthritis, unspecified knee: Secondary | ICD-10-CM | POA: Diagnosis not present

## 2017-12-25 DIAGNOSIS — Z87891 Personal history of nicotine dependence: Secondary | ICD-10-CM | POA: Diagnosis not present

## 2017-12-25 DIAGNOSIS — B0229 Other postherpetic nervous system involvement: Secondary | ICD-10-CM | POA: Diagnosis not present

## 2017-12-25 DIAGNOSIS — Z86018 Personal history of other benign neoplasm: Secondary | ICD-10-CM | POA: Diagnosis not present

## 2017-12-25 DIAGNOSIS — Z471 Aftercare following joint replacement surgery: Secondary | ICD-10-CM | POA: Diagnosis not present

## 2017-12-25 DIAGNOSIS — Z7982 Long term (current) use of aspirin: Secondary | ICD-10-CM | POA: Diagnosis not present

## 2017-12-26 ENCOUNTER — Ambulatory Visit (INDEPENDENT_AMBULATORY_CARE_PROVIDER_SITE_OTHER): Payer: Medicare Other | Admitting: Physician Assistant

## 2017-12-26 ENCOUNTER — Encounter (INDEPENDENT_AMBULATORY_CARE_PROVIDER_SITE_OTHER): Payer: Self-pay | Admitting: Physician Assistant

## 2017-12-26 DIAGNOSIS — Z96641 Presence of right artificial hip joint: Secondary | ICD-10-CM

## 2017-12-26 MED ORDER — OXYCODONE HCL 5 MG PO TABS: 5.0000 mg | ORAL_TABLET | ORAL | 0 refills | Status: DC | PRN

## 2017-12-26 MED ORDER — METHOCARBAMOL 500 MG PO TABS: 500.0000 mg | ORAL_TABLET | Freq: Four times a day (QID) | ORAL | 1 refills | Status: DC | PRN

## 2017-12-26 NOTE — Progress Notes (Signed)
HPI: [name] returns today 13 days status post right total hip arthroplasty.  She states she is overall doing well.  She is ambulating with a cane but states she often walks often leaves it.  She is been on 81 mg aspirin twice daily.  She is on 81 mg prior to surgery.  She denies any fevers chills shortness of breath chest pain or calf pain.  States that her back pain seems to improve since his surgery.  Physical exam: Right hip surgical incisions healing well no signs of infection.  Small seroma.  Right calf supple nontender.  Dorsiflexion plantarflexion ankle intact.  Impression: Status post right total hip arthroplasty 13 days  Plan: She is able to get the incision wet in shower.  She is work on TEFL teacher.  Follow-up with [place] in 1 month sooner if there is any questions concerns or signs of infection about the incision.  Refill on her oxycodone and Robaxin was given today.

## 2017-12-27 DIAGNOSIS — Z86010 Personal history of colonic polyps: Secondary | ICD-10-CM | POA: Diagnosis not present

## 2017-12-27 DIAGNOSIS — Z7982 Long term (current) use of aspirin: Secondary | ICD-10-CM | POA: Diagnosis not present

## 2017-12-27 DIAGNOSIS — M19079 Primary osteoarthritis, unspecified ankle and foot: Secondary | ICD-10-CM | POA: Diagnosis not present

## 2017-12-27 DIAGNOSIS — B0229 Other postherpetic nervous system involvement: Secondary | ICD-10-CM | POA: Diagnosis not present

## 2017-12-27 DIAGNOSIS — L80 Vitiligo: Secondary | ICD-10-CM | POA: Diagnosis not present

## 2017-12-27 DIAGNOSIS — Z87891 Personal history of nicotine dependence: Secondary | ICD-10-CM | POA: Diagnosis not present

## 2017-12-27 DIAGNOSIS — Z96641 Presence of right artificial hip joint: Secondary | ICD-10-CM | POA: Diagnosis not present

## 2017-12-27 DIAGNOSIS — Z471 Aftercare following joint replacement surgery: Secondary | ICD-10-CM | POA: Diagnosis not present

## 2017-12-27 DIAGNOSIS — M1710 Unilateral primary osteoarthritis, unspecified knee: Secondary | ICD-10-CM | POA: Diagnosis not present

## 2017-12-27 DIAGNOSIS — G8929 Other chronic pain: Secondary | ICD-10-CM | POA: Diagnosis not present

## 2017-12-27 DIAGNOSIS — I10 Essential (primary) hypertension: Secondary | ICD-10-CM | POA: Diagnosis not present

## 2017-12-27 DIAGNOSIS — Z86018 Personal history of other benign neoplasm: Secondary | ICD-10-CM | POA: Diagnosis not present

## 2017-12-27 DIAGNOSIS — M479 Spondylosis, unspecified: Secondary | ICD-10-CM | POA: Diagnosis not present

## 2018-01-23 ENCOUNTER — Ambulatory Visit (INDEPENDENT_AMBULATORY_CARE_PROVIDER_SITE_OTHER): Payer: Medicare Other | Admitting: Physician Assistant

## 2018-01-28 DIAGNOSIS — I10 Essential (primary) hypertension: Secondary | ICD-10-CM | POA: Diagnosis not present

## 2018-01-28 DIAGNOSIS — E039 Hypothyroidism, unspecified: Secondary | ICD-10-CM | POA: Diagnosis not present

## 2018-01-28 DIAGNOSIS — Z131 Encounter for screening for diabetes mellitus: Secondary | ICD-10-CM | POA: Diagnosis not present

## 2018-01-28 DIAGNOSIS — M79604 Pain in right leg: Secondary | ICD-10-CM | POA: Diagnosis not present

## 2018-01-28 DIAGNOSIS — E785 Hyperlipidemia, unspecified: Secondary | ICD-10-CM | POA: Diagnosis not present

## 2018-01-28 DIAGNOSIS — R0683 Snoring: Secondary | ICD-10-CM | POA: Diagnosis not present

## 2018-01-28 DIAGNOSIS — E119 Type 2 diabetes mellitus without complications: Secondary | ICD-10-CM | POA: Diagnosis not present

## 2018-01-28 DIAGNOSIS — Z23 Encounter for immunization: Secondary | ICD-10-CM | POA: Diagnosis not present

## 2018-01-28 DIAGNOSIS — E559 Vitamin D deficiency, unspecified: Secondary | ICD-10-CM | POA: Diagnosis not present

## 2018-01-28 DIAGNOSIS — R5383 Other fatigue: Secondary | ICD-10-CM | POA: Diagnosis not present

## 2018-01-29 ENCOUNTER — Ambulatory Visit (INDEPENDENT_AMBULATORY_CARE_PROVIDER_SITE_OTHER): Payer: Medicare Other | Admitting: Physician Assistant

## 2018-01-29 ENCOUNTER — Encounter (INDEPENDENT_AMBULATORY_CARE_PROVIDER_SITE_OTHER): Payer: Self-pay | Admitting: Physician Assistant

## 2018-01-29 DIAGNOSIS — Z96641 Presence of right artificial hip joint: Secondary | ICD-10-CM

## 2018-01-29 MED ORDER — OXYCODONE HCL 5 MG PO TABS: 5.0000 mg | ORAL_TABLET | ORAL | 0 refills | Status: DC | PRN

## 2018-01-29 NOTE — Progress Notes (Signed)
HPI: Jeanette Compton returns today 6 weeks status post right total hip arthroplasty.  States overall she is doing well she has pain whenever she first begins to ambulate.  She is using no assistive device.  She is asking for refill on oxycodone.  She has some discomfort/pain lateral aspect hip when she does she is trying to sleep.  States that her surgical incisions healing well with.  Physical exam: General well-developed well-nourished female no acute distress Right hip good range of motion with slight discomfort with extremes of internal rotation.  She has tenderness over the right greater trochanteric region.  Right calf supple nontender.  Impression: 6 weeks status post right total hip arthroplasty Right hip trochanteric bursitis  Plan: She will work on IT band stretching exercises as shown today.  See her back in 6 weeks check progress lack of.  She is given a refill on her oxycodone.  Any future refills for pain medicine associated with her hip replacement will need to be the Norco.  She understands this.

## 2018-02-25 DIAGNOSIS — E119 Type 2 diabetes mellitus without complications: Secondary | ICD-10-CM | POA: Diagnosis not present

## 2018-02-25 DIAGNOSIS — R0683 Snoring: Secondary | ICD-10-CM | POA: Diagnosis not present

## 2018-02-25 DIAGNOSIS — E785 Hyperlipidemia, unspecified: Secondary | ICD-10-CM | POA: Diagnosis not present

## 2018-03-12 ENCOUNTER — Ambulatory Visit (INDEPENDENT_AMBULATORY_CARE_PROVIDER_SITE_OTHER): Payer: Medicare Other | Admitting: Physician Assistant

## 2018-03-12 ENCOUNTER — Encounter (INDEPENDENT_AMBULATORY_CARE_PROVIDER_SITE_OTHER): Payer: Self-pay | Admitting: Physician Assistant

## 2018-03-12 VITALS — Ht 66.0 in | Wt 208.0 lb

## 2018-03-12 DIAGNOSIS — Z96641 Presence of right artificial hip joint: Secondary | ICD-10-CM

## 2018-03-12 MED ORDER — HYDROCODONE-ACETAMINOPHEN 5-325 MG PO TABS: 1.0000 | ORAL_TABLET | Freq: Four times a day (QID) | ORAL | 0 refills | Status: DC | PRN

## 2018-03-12 NOTE — Progress Notes (Signed)
HPI: Ms. Gonser returns today 12 weeks status post right total hip arthroplasty.  Still having pain lateral aspect of the hip some pain in the groin area.  States that weather changes made her pain worse.  She is asked for refill on oxycodone.  She does state she is stretching her IT band some in the mornings.  Physical exam: Right hip good internal and external rotation without significant pain.  She has tenderness over the right trochanteric region.  Calf supple nontender.  Ambulates without any assistive device.  Impression: Status post right total hip arthroplasty 12 weeks Right hip trochanteric bursitis  Plan: She will continue IT band stretching.  She is given a prescription for Norco which she is to use sparingly.  She will follow-up with [place] in 3 months at that point if she still having pain right trochanteric region consider a trochanteric injection.  Questions encouraged and answered at length.  She will continue work on range of motion strengthening the right hip.

## 2018-04-16 ENCOUNTER — Other Ambulatory Visit (INDEPENDENT_AMBULATORY_CARE_PROVIDER_SITE_OTHER): Payer: Self-pay | Admitting: Physician Assistant

## 2018-04-16 ENCOUNTER — Telehealth (INDEPENDENT_AMBULATORY_CARE_PROVIDER_SITE_OTHER): Payer: Self-pay | Admitting: Orthopaedic Surgery

## 2018-04-16 MED ORDER — HYDROCODONE-ACETAMINOPHEN 5-325 MG PO TABS: 1.0000 | ORAL_TABLET | Freq: Four times a day (QID) | ORAL | 0 refills | Status: DC | PRN

## 2018-04-16 NOTE — Telephone Encounter (Signed)
Patient called needing Rx refilled (Hydrocodone) Patient asked if the Rx can be sent to [facility] in [place] or would she need to pick up Rx. The number to contact patient [place] [phone]

## 2018-04-16 NOTE — Telephone Encounter (Signed)
Please advise 

## 2018-04-16 NOTE — Telephone Encounter (Signed)
Patient left a message inquiring about her refill on her medication.

## 2018-04-21 DIAGNOSIS — I10 Essential (primary) hypertension: Secondary | ICD-10-CM | POA: Diagnosis not present

## 2018-04-21 DIAGNOSIS — E559 Vitamin D deficiency, unspecified: Secondary | ICD-10-CM | POA: Diagnosis not present

## 2018-04-21 DIAGNOSIS — R5383 Other fatigue: Secondary | ICD-10-CM | POA: Diagnosis not present

## 2018-04-21 DIAGNOSIS — E039 Hypothyroidism, unspecified: Secondary | ICD-10-CM | POA: Diagnosis not present

## 2018-04-21 DIAGNOSIS — E119 Type 2 diabetes mellitus without complications: Secondary | ICD-10-CM | POA: Diagnosis not present

## 2018-05-22 DIAGNOSIS — E119 Type 2 diabetes mellitus without complications: Secondary | ICD-10-CM | POA: Diagnosis not present

## 2018-05-22 DIAGNOSIS — E039 Hypothyroidism, unspecified: Secondary | ICD-10-CM | POA: Diagnosis not present

## 2018-05-22 DIAGNOSIS — K649 Unspecified hemorrhoids: Secondary | ICD-10-CM | POA: Diagnosis not present

## 2018-05-22 DIAGNOSIS — K625 Hemorrhage of anus and rectum: Secondary | ICD-10-CM | POA: Diagnosis not present

## 2018-05-26 ENCOUNTER — Other Ambulatory Visit (HOSPITAL_COMMUNITY): Payer: Self-pay | Admitting: Internal Medicine

## 2018-05-26 DIAGNOSIS — Z1211 Encounter for screening for malignant neoplasm of colon: Secondary | ICD-10-CM | POA: Diagnosis not present

## 2018-05-26 DIAGNOSIS — E039 Hypothyroidism, unspecified: Secondary | ICD-10-CM | POA: Diagnosis not present

## 2018-05-26 DIAGNOSIS — K625 Hemorrhage of anus and rectum: Secondary | ICD-10-CM | POA: Diagnosis not present

## 2018-05-26 DIAGNOSIS — Z1231 Encounter for screening mammogram for malignant neoplasm of breast: Secondary | ICD-10-CM

## 2018-06-06 ENCOUNTER — Ambulatory Visit (HOSPITAL_COMMUNITY): Payer: Medicare Other

## 2018-06-06 ENCOUNTER — Ambulatory Visit (HOSPITAL_COMMUNITY)
Admission: RE | Admit: 2018-06-06 | Discharge: 2018-06-06 | Disposition: A | Discharge status: Home / Self Care | Payer: Medicare Other | Source: Ambulatory Visit | Attending: Internal Medicine | Admitting: Internal Medicine

## 2018-06-06 ENCOUNTER — Encounter (HOSPITAL_COMMUNITY): Payer: Self-pay

## 2018-06-06 DIAGNOSIS — Z1231 Encounter for screening mammogram for malignant neoplasm of breast: Secondary | ICD-10-CM | POA: Diagnosis not present

## 2018-06-10 ENCOUNTER — Telehealth (INDEPENDENT_AMBULATORY_CARE_PROVIDER_SITE_OTHER): Payer: Self-pay | Admitting: Orthopaedic Surgery

## 2018-06-10 NOTE — Telephone Encounter (Signed)
Patient called requesting refill of Hydrocodone.  Please call patient to advise. [phone]

## 2018-06-10 NOTE — Telephone Encounter (Signed)
Please advise 

## 2018-06-11 MED ORDER — TRAMADOL HCL 50 MG PO TABS: 50.0000 mg | ORAL_TABLET | Freq: Four times a day (QID) | ORAL | 0 refills | Status: DC | PRN

## 2018-06-11 NOTE — Telephone Encounter (Signed)
LMOM for patient letting her know the message below and to call back if she wanted me to call in the tramadol

## 2018-06-11 NOTE — Telephone Encounter (Signed)
No to hydrocodone.  It's been over 6 months since her surgery, so we need her off of that kind of med.  See if she would like Tramadol instead for short-term.

## 2018-06-11 NOTE — Telephone Encounter (Signed)
Patient called back stating that the Tramadol would be fine for her.  She would like that called into [facility] in [place].  CB#[phone].  Thank you.

## 2018-06-11 NOTE — Telephone Encounter (Signed)
Patient called back stating that the Tramadol would be fine

## 2018-06-12 ENCOUNTER — Ambulatory Visit (INDEPENDENT_AMBULATORY_CARE_PROVIDER_SITE_OTHER): Payer: Medicare Other | Admitting: Orthopaedic Surgery

## 2018-07-21 ENCOUNTER — Ambulatory Visit (INDEPENDENT_AMBULATORY_CARE_PROVIDER_SITE_OTHER): Payer: Medicare Other | Admitting: Internal Medicine

## 2018-07-24 DIAGNOSIS — E1169 Type 2 diabetes mellitus with other specified complication: Secondary | ICD-10-CM | POA: Diagnosis not present

## 2018-07-24 DIAGNOSIS — I10 Essential (primary) hypertension: Secondary | ICD-10-CM | POA: Diagnosis not present

## 2018-07-24 DIAGNOSIS — E039 Hypothyroidism, unspecified: Secondary | ICD-10-CM | POA: Diagnosis not present

## 2018-07-29 ENCOUNTER — Encounter (INDEPENDENT_AMBULATORY_CARE_PROVIDER_SITE_OTHER): Payer: Self-pay | Admitting: *Deleted

## 2018-07-29 ENCOUNTER — Ambulatory Visit (INDEPENDENT_AMBULATORY_CARE_PROVIDER_SITE_OTHER): Payer: Medicare Other | Admitting: Internal Medicine

## 2018-07-29 ENCOUNTER — Encounter (INDEPENDENT_AMBULATORY_CARE_PROVIDER_SITE_OTHER): Payer: Self-pay | Admitting: Internal Medicine

## 2018-07-29 VITALS — BP 155/84 | HR 76 | Temp 97.9°F | Ht 66.0 in | Wt 200.2 lb

## 2018-07-29 DIAGNOSIS — K625 Hemorrhage of anus and rectum: Secondary | ICD-10-CM

## 2018-07-29 DIAGNOSIS — R195 Other fecal abnormalities: Secondary | ICD-10-CM | POA: Insufficient documentation

## 2018-07-29 DIAGNOSIS — Z86010 Personal history of colonic polyps: Secondary | ICD-10-CM

## 2018-07-29 NOTE — Progress Notes (Signed)
Subjective:    Patient ID: Jeanette Compton, female    DOB: Mar 29, 1956, 63 y.o.   MRN: [id number]  HPI Referred by Dr. Anastasio Champion for rectal bleeding/positive FIT test.  She says when she is constipated she may see blood when she wipes. Symptoms for 4-5 months off and on. Bleeding only occurs when she is constipated. She takes Miralax as needed for constipation and may be a stool softener. Her last colonoscopy was in April  of 2013. Examination performed to cecum. 8 mm  polyp snared from ascending colon. Small polyp hepatic flexure was coagulated.  Twio diverticula at the ascending colon. 34mm polyp was tubular adenoma. One small polyp was coagulated. Next colonoscopy in 5  Yrs.    Hx of DM type 2, hypothyroidism, hypertension.    05/26/2018 H and H 12.7 and 39.1.    Review of Systems Past Medical History:  Diagnosis Date  . Arthritis    knee , ankle and back  . Bronchitis   . Cataract   . Hyperlipidemia   . Hypertension   . Neuralgia, post-herpetic 11/13/2016  . Thyroid disease     Past Surgical History:  Procedure Laterality Date  . ABDOMINAL HYSTERECTOMY     fibroid  . BREAST BIOPSY Right 07/31/2017   Procedure: EXCISIONAL BREAST BIOPSY WITH NEEDLE LOCALIZATION;  Surgeon: Virl Cagey, MD;  Location: [facility];  Service: General;  Laterality: Right;  . BREAST SURGERY Right   . COLONOSCOPY  09/13/2011   Procedure: COLONOSCOPY;  Surgeon: Rogene Houston, MD;  Location: [facility];  Service: Endoscopy;  Laterality: N/A;  1030  . TOTAL HIP ARTHROPLASTY Right 12/13/2017   Procedure: RIGHT TOTAL HIP ARTHROPLASTY ANTERIOR APPROACH;  Surgeon: Mcarthur Rossetti, MD;  Location: [facility];  Service: Orthopedics;  Laterality: Right;    Allergies  Allergen Reactions  . Lisinopril Swelling and Other (See Comments)    Lips swelled  . Naproxen Itching and Other (See Comments)    "hurts my stomach"    Current Outpatient Medications on File Prior to Visit  Medication Sig Dispense  Refill  . aspirin 81 MG chewable tablet Chew 1 tablet (81 mg total) by mouth 2 (two) times daily. 30 tablet 0  . losartan-hydrochlorothiazide (HYZAAR) 50-12.5 MG tablet Take 1 tablet by mouth daily. 90 tablet 3  . methocarbamol (ROBAXIN) 500 MG tablet Take 1 tablet (500 mg total) by mouth every 6 (six) hours as needed for muscle spasms. 40 tablet 1  . Multiple Vitamin (MULTIVITAMIN WITH MINERALS) TABS tablet Take 1 tablet by mouth daily. One-A-Day    . Multiple Vitamins-Minerals (HAIR SKIN AND NAILS FORMULA) TABS Take 2 tablets by mouth daily.    . traMADol (ULTRAM) 50 MG tablet Take 1-2 tablets (50-100 mg total) by mouth every 6 (six) hours as needed. 40 tablet 0   No current facility-administered medications on file prior to visit.         Objective:   Physical Exam Blood pressure (!) 155/84, pulse 76, temperature 97.9 F (36.6 C), height 5\' 6"  (1.676 m), weight 200 lb 3.2 oz (90.8 kg). Alert and oriented. Skin warm and dry. Oral mucosa is moist.   . Sclera anicteric, conjunctivae is pink. Thyroid not enlarged. No cervical lymphadenopathy. Lungs clear. Heart regular rate and rhythm.  Abdomen is soft. Bowel sounds are positive. No hepatomegaly. No abdominal masses felt. No tenderness.  No edema to lower extremities. Patient is alert and oriented.         Assessment &  Plan:  Positive FIT, rectal bleeding. Colon polyp.  Colonoscopy. The risks of bleeding, perforation and infection were reviewed with patient.

## 2018-07-29 NOTE — Patient Instructions (Signed)
The risks of bleeding, perforation and infection were reviewed with patient.  

## 2018-07-31 ENCOUNTER — Encounter (HOSPITAL_COMMUNITY)
Admission: RE | Disposition: A | Discharge status: Home / Self Care | Payer: Self-pay | Source: Home / Self Care | Attending: Internal Medicine

## 2018-07-31 ENCOUNTER — Other Ambulatory Visit: Payer: Self-pay

## 2018-07-31 ENCOUNTER — Ambulatory Visit (HOSPITAL_COMMUNITY)
Admission: RE | Admit: 2018-07-31 | Discharge: 2018-07-31 | Disposition: A | Discharge status: Home / Self Care | Payer: Medicare Other | Attending: Internal Medicine | Admitting: Internal Medicine

## 2018-07-31 ENCOUNTER — Encounter (HOSPITAL_COMMUNITY): Payer: Self-pay | Admitting: *Deleted

## 2018-07-31 DIAGNOSIS — Z90710 Acquired absence of both cervix and uterus: Secondary | ICD-10-CM | POA: Diagnosis not present

## 2018-07-31 DIAGNOSIS — Z79899 Other long term (current) drug therapy: Secondary | ICD-10-CM | POA: Diagnosis not present

## 2018-07-31 DIAGNOSIS — D123 Benign neoplasm of transverse colon: Secondary | ICD-10-CM

## 2018-07-31 DIAGNOSIS — M1990 Unspecified osteoarthritis, unspecified site: Secondary | ICD-10-CM | POA: Diagnosis not present

## 2018-07-31 DIAGNOSIS — Z86010 Personal history of colonic polyps: Secondary | ICD-10-CM | POA: Insufficient documentation

## 2018-07-31 DIAGNOSIS — D125 Benign neoplasm of sigmoid colon: Secondary | ICD-10-CM | POA: Diagnosis not present

## 2018-07-31 DIAGNOSIS — K5730 Diverticulosis of large intestine without perforation or abscess without bleeding: Secondary | ICD-10-CM | POA: Insufficient documentation

## 2018-07-31 DIAGNOSIS — K921 Melena: Secondary | ICD-10-CM | POA: Diagnosis present

## 2018-07-31 DIAGNOSIS — D127 Benign neoplasm of rectosigmoid junction: Secondary | ICD-10-CM

## 2018-07-31 DIAGNOSIS — Z96641 Presence of right artificial hip joint: Secondary | ICD-10-CM | POA: Insufficient documentation

## 2018-07-31 DIAGNOSIS — K648 Other hemorrhoids: Secondary | ICD-10-CM

## 2018-07-31 DIAGNOSIS — Z886 Allergy status to analgesic agent status: Secondary | ICD-10-CM | POA: Insufficient documentation

## 2018-07-31 DIAGNOSIS — K6289 Other specified diseases of anus and rectum: Secondary | ICD-10-CM

## 2018-07-31 DIAGNOSIS — Z7982 Long term (current) use of aspirin: Secondary | ICD-10-CM | POA: Insufficient documentation

## 2018-07-31 DIAGNOSIS — K644 Residual hemorrhoidal skin tags: Secondary | ICD-10-CM | POA: Insufficient documentation

## 2018-07-31 DIAGNOSIS — E785 Hyperlipidemia, unspecified: Secondary | ICD-10-CM | POA: Insufficient documentation

## 2018-07-31 DIAGNOSIS — K625 Hemorrhage of anus and rectum: Secondary | ICD-10-CM

## 2018-07-31 DIAGNOSIS — R195 Other fecal abnormalities: Secondary | ICD-10-CM

## 2018-07-31 DIAGNOSIS — I10 Essential (primary) hypertension: Secondary | ICD-10-CM | POA: Diagnosis not present

## 2018-07-31 DIAGNOSIS — Z888 Allergy status to other drugs, medicaments and biological substances status: Secondary | ICD-10-CM | POA: Insufficient documentation

## 2018-07-31 DIAGNOSIS — E079 Disorder of thyroid, unspecified: Secondary | ICD-10-CM | POA: Diagnosis not present

## 2018-07-31 DIAGNOSIS — Z87891 Personal history of nicotine dependence: Secondary | ICD-10-CM | POA: Insufficient documentation

## 2018-07-31 HISTORY — PX: POLYPECTOMY: SHX5525

## 2018-07-31 HISTORY — PX: COLONOSCOPY: SHX5424

## 2018-07-31 SURGERY — COLONOSCOPY
Anesthesia: Moderate Sedation

## 2018-07-31 MED ORDER — MEPERIDINE HCL 50 MG/ML IJ SOLN
INTRAMUSCULAR | Status: DC | PRN
  Administered 2018-07-31 (×2): 25 mg

## 2018-07-31 MED ORDER — MEPERIDINE HCL 50 MG/ML IJ SOLN
INTRAMUSCULAR | Status: AC
  Filled 2018-07-31: qty 1

## 2018-07-31 MED ORDER — SODIUM CHLORIDE 0.9 % IV SOLN
INTRAVENOUS | Status: DC
  Administered 2018-07-31: 11:00:00 via INTRAVENOUS

## 2018-07-31 MED ORDER — MIDAZOLAM HCL 5 MG/5ML IJ SOLN
INTRAMUSCULAR | Status: DC | PRN
  Administered 2018-07-31: 1 mg via INTRAVENOUS
  Administered 2018-07-31 (×2): 2 mg via INTRAVENOUS

## 2018-07-31 MED ORDER — MIDAZOLAM HCL 5 MG/5ML IJ SOLN
INTRAMUSCULAR | Status: AC
  Filled 2018-07-31: qty 10

## 2018-07-31 NOTE — Discharge Instructions (Signed)
No aspirin or NSAIDs for 1 week. Resume other medications as before. High-fiber diet. No driving for 24 hours. Physician will call with biopsy results.    Colon Polyps  Polyps are tissue growths inside the body. Polyps can grow in many places, including the large intestine (colon). A polyp may be a round bump or a mushroom-shaped growth. You could have one polyp or several. Most colon polyps are noncancerous (benign). However, some colon polyps can become cancerous over time. Finding and removing the polyps early can help prevent this. What are the causes? The exact cause of colon polyps is not known. What increases the risk? You are more likely to develop this condition if you:  Have a family history of colon cancer or colon polyps.  Are older than 34 or older than 45 if you are African American.  Have inflammatory bowel disease, such as ulcerative colitis or Crohn's disease.  Have certain hereditary conditions, such as: ? Familial adenomatous polyposis. ? Lynch syndrome. ? Turcot syndrome. ? Peutz-Jeghers syndrome.  Are overweight.  Smoke cigarettes.  Do not get enough exercise.  Drink too much alcohol.  Eat a diet that is high in fat and red meat and low in fiber.  Had childhood cancer that was treated with abdominal radiation. What are the signs or symptoms? Most polyps do not cause symptoms. If you have symptoms, they may include:  Blood coming from your rectum when having a bowel movement.  Blood in your stool. The stool may look dark red or black.  Abdominal pain.  A change in bowel habits, such as constipation or diarrhea. How is this diagnosed? This condition is diagnosed with a colonoscopy. This is a procedure in which a lighted, flexible scope is inserted into the anus and then passed into the colon to examine the area. Polyps are sometimes found when a colonoscopy is done as part of routine cancer screening tests. How is this treated? Treatment for  this condition involves removing any polyps that are found. Most polyps can be removed during a colonoscopy. Those polyps will then be tested for cancer. Additional treatment may be needed depending on the results of testing. Follow these instructions at home: Lifestyle  Maintain a healthy weight, or lose weight if recommended by your health care provider.  Exercise every day or as told by your health care provider.  Do not use any products that contain nicotine or tobacco, such as cigarettes and e-cigarettes. If you need help quitting, ask your health care provider.  If you drink alcohol, limit how much you have: ? 0-1 drink a day for women. ? 0-2 drinks a day for men.  Be aware of how much alcohol is in your drink. In the [place] one drink equals one 12 oz bottle of beer (355 mL), one 5 oz glass of wine (148 mL), or one 1 oz shot of hard liquor (44 mL). Eating and drinking   Eat foods that are high in fiber, such as fruits, vegetables, and whole grains.  Eat foods that are high in calcium and vitamin D, such as milk, cheese, yogurt, eggs, liver, fish, and broccoli.  Limit foods that are high in fat, such as fried foods and desserts.  Limit the amount of red meat and processed meat you eat, such as hot dogs, sausage, bacon, and lunch meats. General instructions  Keep all follow-up visits as told by your health care provider. This is important. ? This includes having regularly scheduled colonoscopies. ? Talk to your  health care provider about when you need a colonoscopy. Contact a health care provider if:  You have new or worsening bleeding during a bowel movement.  You have new or increased blood in your stool.  You have a change in bowel habits.  You lose weight for no known reason. Summary  Polyps are tissue growths inside the body. Polyps can grow in many places, including the colon.  Most colon polyps are noncancerous (benign), but some can become cancerous over  time.  This condition is diagnosed with a colonoscopy.  Treatment for this condition involves removing any polyps that are found. Most polyps can be removed during a colonoscopy. This information is not intended to replace advice given to you by your health care provider. Make sure you discuss any questions you have with your health care provider. Document Released: 02/01/2004 Document Revised: 08/22/2017 Document Reviewed: 08/22/2017 Elsevier Interactive Patient Education  2019 [employer].  Colonoscopy, Adult, Care After This sheet gives you information about how to care for yourself after your procedure. Your doctor may also give you more specific instructions. If you have problems or questions, call your doctor. What can I expect after the procedure? After the procedure, it is common to have:  A small amount of blood in your poop for 24 hours.  Some gas.  Mild cramping or bloating in your belly. Follow these instructions at home: General instructions  For the first 24 hours after the procedure: ? Do not drive or use machinery. ? Do not sign important documents. ? Do not drink alcohol. ? Do your daily activities more slowly than normal. ? Eat foods that are soft and easy to digest.  Take over-the-counter or prescription medicines only as told by your doctor. To help cramping and bloating:   Try walking around.  Put heat on your belly (abdomen) as told by your doctor. Use a heat source that your doctor recommends, such as a moist heat pack or a heating pad. ? Put a towel between your skin and the heat source. ? Leave the heat on for 20-30 minutes. ? Remove the heat if your skin turns bright red. This is especially important if you cannot feel pain, heat, or cold. You can get burned. Eating and drinking   Drink enough fluid to keep your pee (urine) clear or pale yellow.  Return to your normal diet as told by your doctor. Avoid heavy or fried foods that are hard to  digest.  Avoid drinking alcohol for as long as told by your doctor. Contact a doctor if:  You have blood in your poop (stool) 2-3 days after the procedure. Get help right away if:  You have more than a small amount of blood in your poop.  You see large clumps of tissue (blood clots) in your poop.  Your belly is swollen.  You feel sick to your stomach (nauseous).  You throw up (vomit).  You have a fever.  You have belly pain that gets worse, and medicine does not help your pain. Summary  After the procedure, it is common to have a small amount of blood in your poop. You may also have mild cramping and bloating in your belly.  For the first 24 hours after the procedure, do not drive or use machinery, do not sign important documents, and do not drink alcohol.  Get help right away if you have a lot of blood in your poop, feel sick to your stomach, have a fever, or have more  belly pain. This information is not intended to replace advice given to you by your health care provider. Make sure you discuss any questions you have with your health care provider. Document Released: 06/09/2010 Document Revised: 03/07/2017 Document Reviewed: 01/30/2016 Elsevier Interactive Patient Education  2019 [employer]  Hemorrhoids Hemorrhoids are swollen veins that may develop:  In the butt (rectum). These are called internal hemorrhoids.  Around the opening of the butt (anus). These are called external hemorrhoids. Hemorrhoids can cause pain, itching, or bleeding. Most of the time, they do not cause serious problems. They usually get better with diet changes, lifestyle changes, and other home treatments. What are the causes? This condition may be caused by:  Having trouble pooping (constipation).  Pushing hard (straining) to poop.  Watery poop (diarrhea).  Pregnancy.  Being very overweight (obese).  Sitting for long periods of time.  Heavy lifting or other activity that causes you to  strain.  Anal sex.  Riding a bike for a long period of time. What are the signs or symptoms? Symptoms of this condition include:  Pain.  Itching or soreness in the butt.  Bleeding from the butt.  Leaking poop.  Swelling in the area.  One or more lumps around the opening of your butt. How is this diagnosed? A doctor can often diagnose this condition by looking at the affected area. The doctor may also:  Do an exam that involves feeling the area with a gloved hand (digital rectal exam).  Examine the area inside your butt using a small tube (anoscope).  Order blood tests. This may be done if you have lost a lot of blood.  Have you get a test that involves looking inside the colon using a flexible tube with a camera on the end (sigmoidoscopy or colonoscopy). How is this treated? This condition can usually be treated at home. Your doctor may tell you to change what you eat, make lifestyle changes, or try home treatments. If these do not help, procedures can be done to remove the hemorrhoids or make them smaller. These may involve:  Placing rubber bands at the base of the hemorrhoids to cut off their blood supply.  Injecting medicine into the hemorrhoids to shrink them.  Shining a type of light energy onto the hemorrhoids to cause them to fall off.  Doing surgery to remove the hemorrhoids or cut off their blood supply. Follow these instructions at home: Eating and drinking   Eat foods that have a lot of fiber in them. These include whole grains, beans, nuts, fruits, and vegetables.  Ask your doctor about taking products that have added fiber (fibersupplements).  Reduce the amount of fat in your diet. You can do this by: ? Eating low-fat dairy products. ? Eating less red meat. ? Avoiding processed foods.  Drink enough fluid to keep your pee (urine) pale yellow. Managing pain and swelling   Take a warm-water bath (sitz bath) for 20 minutes to ease pain. Do this 3-4  times a day. You may do this in a bathtub or using a portable sitz bath that fits over the toilet.  If told, put ice on the painful area. It may be helpful to use ice between your warm baths. ? Put ice in a plastic bag. ? Place a towel between your skin and the bag. ? Leave the ice on for 20 minutes, 2-3 times a day. General instructions  Take over-the-counter and prescription medicines only as told by your doctor. ? Medicated creams  and medicines may be used as told.  Exercise often. Ask your doctor how much and what kind of exercise is best for you.  Go to the bathroom when you have the urge to poop. Do not wait.  Avoid pushing too hard when you poop.  Keep your butt dry and clean. Use wet toilet paper or moist towelettes after pooping.  Do not sit on the toilet for a long time.  Keep all follow-up visits as told by your doctor. This is important. Contact a doctor if you:  Have pain and swelling that do not get better with treatment or medicine.  Have trouble pooping.  Cannot poop.  Have pain or swelling outside the area of the hemorrhoids. Get help right away if you have:  Bleeding that will not stop. Summary  Hemorrhoids are swollen veins in the butt or around the opening of the butt.  They can cause pain, itching, or bleeding.  Eat foods that have a lot of fiber in them. These include whole grains, beans, nuts, fruits, and vegetables.  Take a warm-water bath (sitz bath) for 20 minutes to ease pain. Do this 3-4 times a day. This information is not intended to replace advice given to you by your health care provider. Make sure you discuss any questions you have with your health care provider. Document Released: 02/14/2008 Document Revised: 09/26/2017 Document Reviewed: 09/26/2017 Elsevier Interactive Patient Education  2019 [employer].

## 2018-07-31 NOTE — H&P (Signed)
Jeanette Compton is an 63 y.o. female.   Chief Complaint: Patient is here for colonoscopy. HPI: Patient 63 year old African-American female who has a history of colonic polyps.  Last colonoscopy was in 2013.  She was sent a recall letter about 2 years ago but decided not to have a colonoscopy.  Recently she is noted blood in his tissue with her bowel movements.  She was seen by her primary care physician and FIT was positive.  Therefore she has decided to proceed with colonoscopy.  She states that her daughter has had polyps removed. Family history is negative for CRC.  Past Medical History:  Diagnosis Date  . Arthritis    knee , ankle and back  . Bronchitis   . Cataract   . Hyperlipidemia   . Hypertension   . Neuralgia, post-herpetic 11/13/2016  . Thyroid disease         History of colonic adenomas.  Past Surgical History:  Procedure Laterality Date  . ABDOMINAL HYSTERECTOMY     fibroid  . BREAST BIOPSY Right 07/31/2017   Procedure: EXCISIONAL BREAST BIOPSY WITH NEEDLE LOCALIZATION;  Surgeon: Virl Cagey, MD;  Location: [facility];  Service: General;  Laterality: Right;  . BREAST SURGERY Right   . COLONOSCOPY  09/13/2011   Procedure: COLONOSCOPY;  Surgeon: Rogene Houston, MD;  Location: [facility];  Service: Endoscopy;  Laterality: N/A;  1030  . TOTAL HIP ARTHROPLASTY Right 12/13/2017   Procedure: RIGHT TOTAL HIP ARTHROPLASTY ANTERIOR APPROACH;  Surgeon: Mcarthur Rossetti, MD;  Location: [facility];  Service: Orthopedics;  Laterality: Right;    Family History  Problem Relation Age of Onset  . Stroke Mother   . Diabetes Mother   . Alcohol abuse Father   . Early death Father   . Early death Sister        pneumonia  . Cancer Brother   . Alzheimer's disease Sister   . COPD Brother   . Colon cancer Neg Hx    Social History:  reports that she quit smoking about 16 years ago. Her smoking use included cigarettes. She has a 1.50 pack-year smoking history. She has never used  smokeless tobacco. She reports current drug use. Drug: Marijuana. She reports that she does not drink alcohol.  Allergies:  Allergies  Allergen Reactions  . Lisinopril Swelling and Other (See Comments)    Lips swelled  . Naproxen Itching and Other (See Comments)    "hurts my stomach"    Medications Prior to Admission  Medication Sig Dispense Refill  . aspirin 81 MG chewable tablet Chew 1 tablet (81 mg total) by mouth 2 (two) times daily. 30 tablet 0  . Cholecalciferol (VITAMIN D3) 125 MCG (5000 UT) CAPS Take 1 capsule by mouth daily.    . hydrochlorothiazide (MICROZIDE) 12.5 MG capsule Take 12.5 mg by mouth daily.    Marland Kitchen losartan (COZAAR) 50 MG tablet Take 50 mg by mouth daily.    . Multiple Vitamin (MULTIVITAMIN WITH MINERALS) TABS tablet Take 1 tablet by mouth daily. One-A-Day    . Multiple Vitamins-Minerals (HAIR SKIN AND NAILS FORMULA) TABS Take 2 tablets by mouth daily.    Marland Kitchen thyroid (ARMOUR) 90 MG tablet Take 90 mg by mouth daily.      No results found for this or any previous visit (from the past 48 hour(s)). No results found.  ROS  Blood pressure (!) 144/76, pulse 62, temperature 98 F (36.7 C), temperature source Oral, resp. rate 13, SpO2 99 %.  Physical Exam  Constitutional: She appears well-developed and well-nourished.  HENT:  Mouth/Throat: Oropharynx is clear and moist.  Eyes: Conjunctivae are normal. No scleral icterus.  Neck: No thyromegaly present.  Cardiovascular: Normal rate, regular rhythm and normal heart sounds.  No murmur heard. Respiratory: Effort normal and breath sounds normal.  GI:  Abdomen is full.  Lower midline scar noted.  Abdomen is soft and nontender with organomegaly or masses.  Musculoskeletal:        General: No edema.  Lymphadenopathy:    She has no cervical adenopathy.  Neurological: She is alert.  Skin: Skin is warm and dry.     Assessment/Plan Hematochezia. Positive FIT test. Diagnostic colonoscopy.  Hildred Laser, MD 07/31/2018,  11:39 AM

## 2018-07-31 NOTE — Op Note (Signed)
[facility] Patient Name: Jeanette Compton Procedure Date: 07/31/2018 11:25 AM MRN: [id number] Date of Birth: [birth year] Attending MD: Hildred Laser , MD CSN: [id number] Age: 63 Admit Type: Outpatient Procedure:                Colonoscopy Indications:              Hematochezia, Positive fecal immunochemical test Providers:                Hildred Laser, MD, Janeece Riggers, RN, Aram Candela Referring MD:             Doree Albee , MD Medicines:                Meperidine 50 mg IV, Midazolam 6 mg IV Complications:            No immediate complications. Estimated Blood Loss:     Estimated blood loss was minimal. Procedure:                Pre-Anesthesia Assessment:                           - Prior to the procedure, a History and Physical                            was performed, and patient medications and                            allergies were reviewed. The patient's tolerance of                            previous anesthesia was also reviewed. The risks                            and benefits of the procedure and the sedation                            options and risks were discussed with the patient.                            All questions were answered, and informed consent                            was obtained. Prior Anticoagulants: The patient                            last took aspirin 2 days prior to the procedure.                            ASA Grade Assessment: II - A patient with mild                            systemic disease. After reviewing the risks and                            benefits, the patient was deemed in satisfactory  condition to undergo the procedure.                           After obtaining informed consent, the colonoscope                            was passed under direct vision. Throughout the                            procedure, the patient's blood pressure, pulse, and                            oxygen saturations  were monitored continuously. The                            PCF-[id number] ([id number]) scope was introduced through                            the anus and advanced to the the cecum, identified                            by appendiceal orifice and ileocecal valve. The                            colonoscopy was performed without difficulty. The                            patient tolerated the procedure well. The quality                            of the bowel preparation was good. The ileocecal                            valve, appendiceal orifice, and rectum were                            photographed. Scope In: 11:52:15 AM Scope Out: 74:82:70 PM Scope Withdrawal Time: 0 hours 20 minutes 42 seconds  Total Procedure Duration: 0 hours 24 minutes 33 seconds  Findings:      The perianal and digital rectal examinations were normal.      A 5 mm polyp was found in the proximal transverse colon. The polyp was       removed with a cold snare. Resection and retrieval were complete. The       pathology specimen was placed into Bottle Number 1.      A small polyp was found in the recto-sigmoid colon. The polyp was       sessile. Biopsies were taken with a cold forceps for histology. The       pathology specimen was placed into Bottle Number 1.      A 10 to 16 mm polyp was found in the distal sigmoid colon. The polyp was       pedunculated. The polyp was removed with a hot snare. Resection and       retrieval were complete. The pathology specimen was placed into Bottle  Number 2.      A few small-mouthed diverticula were found in the sigmoid colon and       hepatic flexure.      Internal hemorrhoids were found during retroflexion. The hemorrhoids       were small.      Anal papilla(e) were hypertrophied. Impression:               - One 5 mm polyp in the proximal transverse colon,                            removed with a cold snare. Resected and retrieved.                           - One small  polyp at the recto-sigmoid colon.                            Biopsied.                           - One 10 to 16 mm polyp in the distal sigmoid                            colon, removed with a hot snare. Resected and                            retrieved.                           - Diverticulosis in the sigmoid colon and at the                            hepatic flexure.                           - Internal hemorrhoids.                           - Small Anal papilla. Moderate Sedation:      Moderate (conscious) sedation was administered by the endoscopy nurse       and supervised by the endoscopist. The following parameters were       monitored: oxygen saturation, heart rate, blood pressure, CO2       capnography and response to care. Total physician intraservice time was       32 minutes. Recommendation:           - Written discharge instructions were provided to                            the patient.                           - Repeat colonoscopy is recommended. The                            colonoscopy date will be determined after pathology  results from today's exam become available for                            review. Procedure Code(s):        --- Professional ---                           [phone], Colonoscopy, flexible; with removal of                            tumor(s), polyp(s), or other lesion(s) by snare                            technique                           45380, 59, Colonoscopy, flexible; with biopsy,                            single or multiple                           99153, Moderate sedation; each additional 15                            minutes intraservice time                           G0500, Moderate sedation services provided by the                            same physician or other qualified health care                            professional performing a gastrointestinal                            endoscopic service that  sedation supports,                            requiring the presence of an independent trained                            observer to assist in the monitoring of the                            patient's level of consciousness and physiological                            status; initial 15 minutes of intra-service time;                            patient age 23 years or older (additional time may                            be reported with [phone], as  appropriate) Diagnosis Code(s):        --- Professional ---                           D12.3, Benign neoplasm of transverse colon (hepatic                            flexure or splenic flexure)                           D12.7, Benign neoplasm of rectosigmoid junction                           D12.5, Benign neoplasm of sigmoid colon                           K64.8, Other hemorrhoids                           K62.89, Other specified diseases of anus and rectum                           K92.1, Melena (includes Hematochezia)                           R19.5, Other fecal abnormalities                           K57.30, Diverticulosis of large intestine without                            perforation or abscess without bleeding CPT copyright 2018 American [employer]. All rights reserved. The codes documented in this report are preliminary and upon coder review may  be revised to meet current compliance requirements. Hildred Laser, MD Hildred Laser, MD 07/31/2018 12:26:18 PM This report has been signed electronically. Number of Addenda: 0

## 2018-08-05 ENCOUNTER — Encounter (HOSPITAL_COMMUNITY): Payer: Self-pay | Admitting: Internal Medicine

## 2018-08-08 ENCOUNTER — Encounter (INDEPENDENT_AMBULATORY_CARE_PROVIDER_SITE_OTHER): Payer: Self-pay

## 2018-09-04 ENCOUNTER — Ambulatory Visit (INDEPENDENT_AMBULATORY_CARE_PROVIDER_SITE_OTHER): Payer: Medicare Other | Admitting: Internal Medicine

## 2018-12-23 ENCOUNTER — Other Ambulatory Visit: Payer: Self-pay

## 2018-12-23 DIAGNOSIS — Z20822 Contact with and (suspected) exposure to covid-19: Secondary | ICD-10-CM

## 2018-12-24 LAB — NOVEL CORONAVIRUS, NAA: SARS-CoV-2, NAA: NOT DETECTED

## 2018-12-25 ENCOUNTER — Telehealth: Payer: Self-pay | Admitting: Internal Medicine

## 2018-12-25 NOTE — Telephone Encounter (Signed)
Advised patient her COVID 19 test results came back negative.  °

## 2019-01-10 IMAGING — DX DG HIP (WITH OR WITHOUT PELVIS) 2-3V*R*
3 series · 3 of 3 positions shown · non-contrast
Comparison: No recent .

CLINICAL DATA: Right hip pain.

EXAM:
DG HIP (WITH OR WITHOUT PELVIS) 2-3V RIGHT

[pelvis ap]
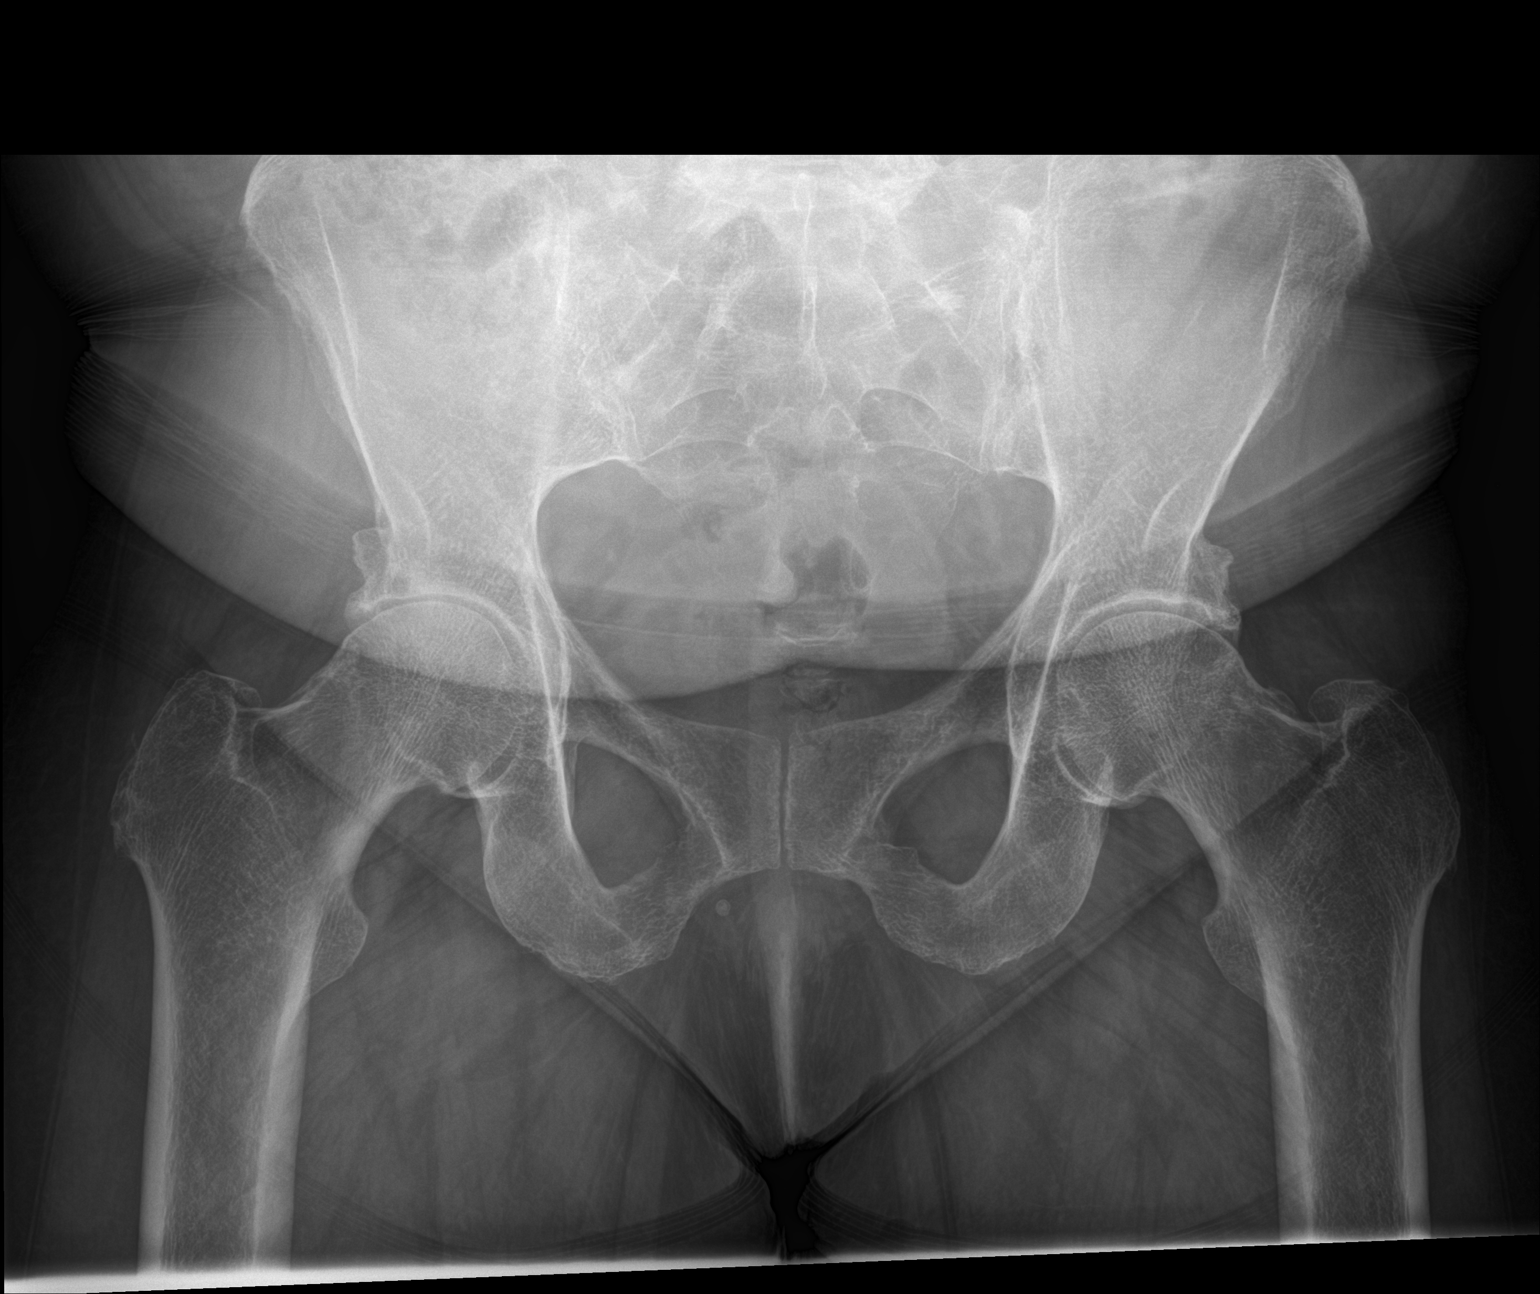

[hip ap]
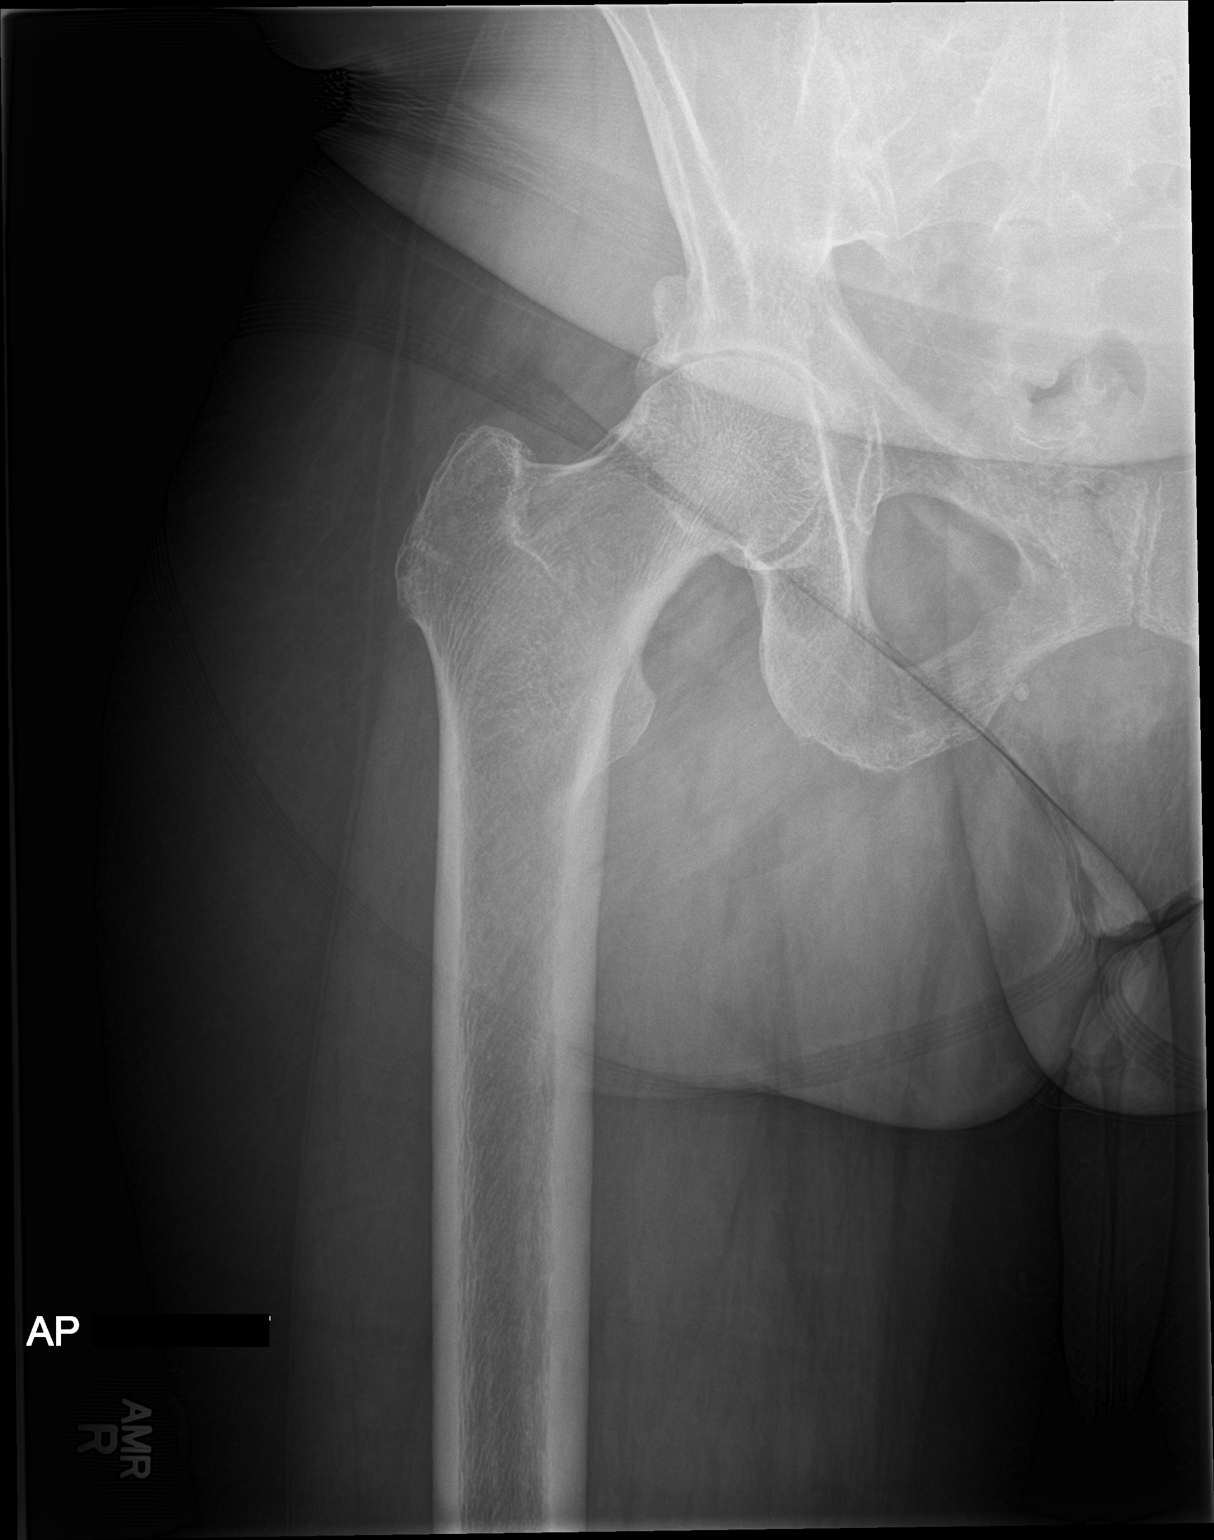

[hip lat]
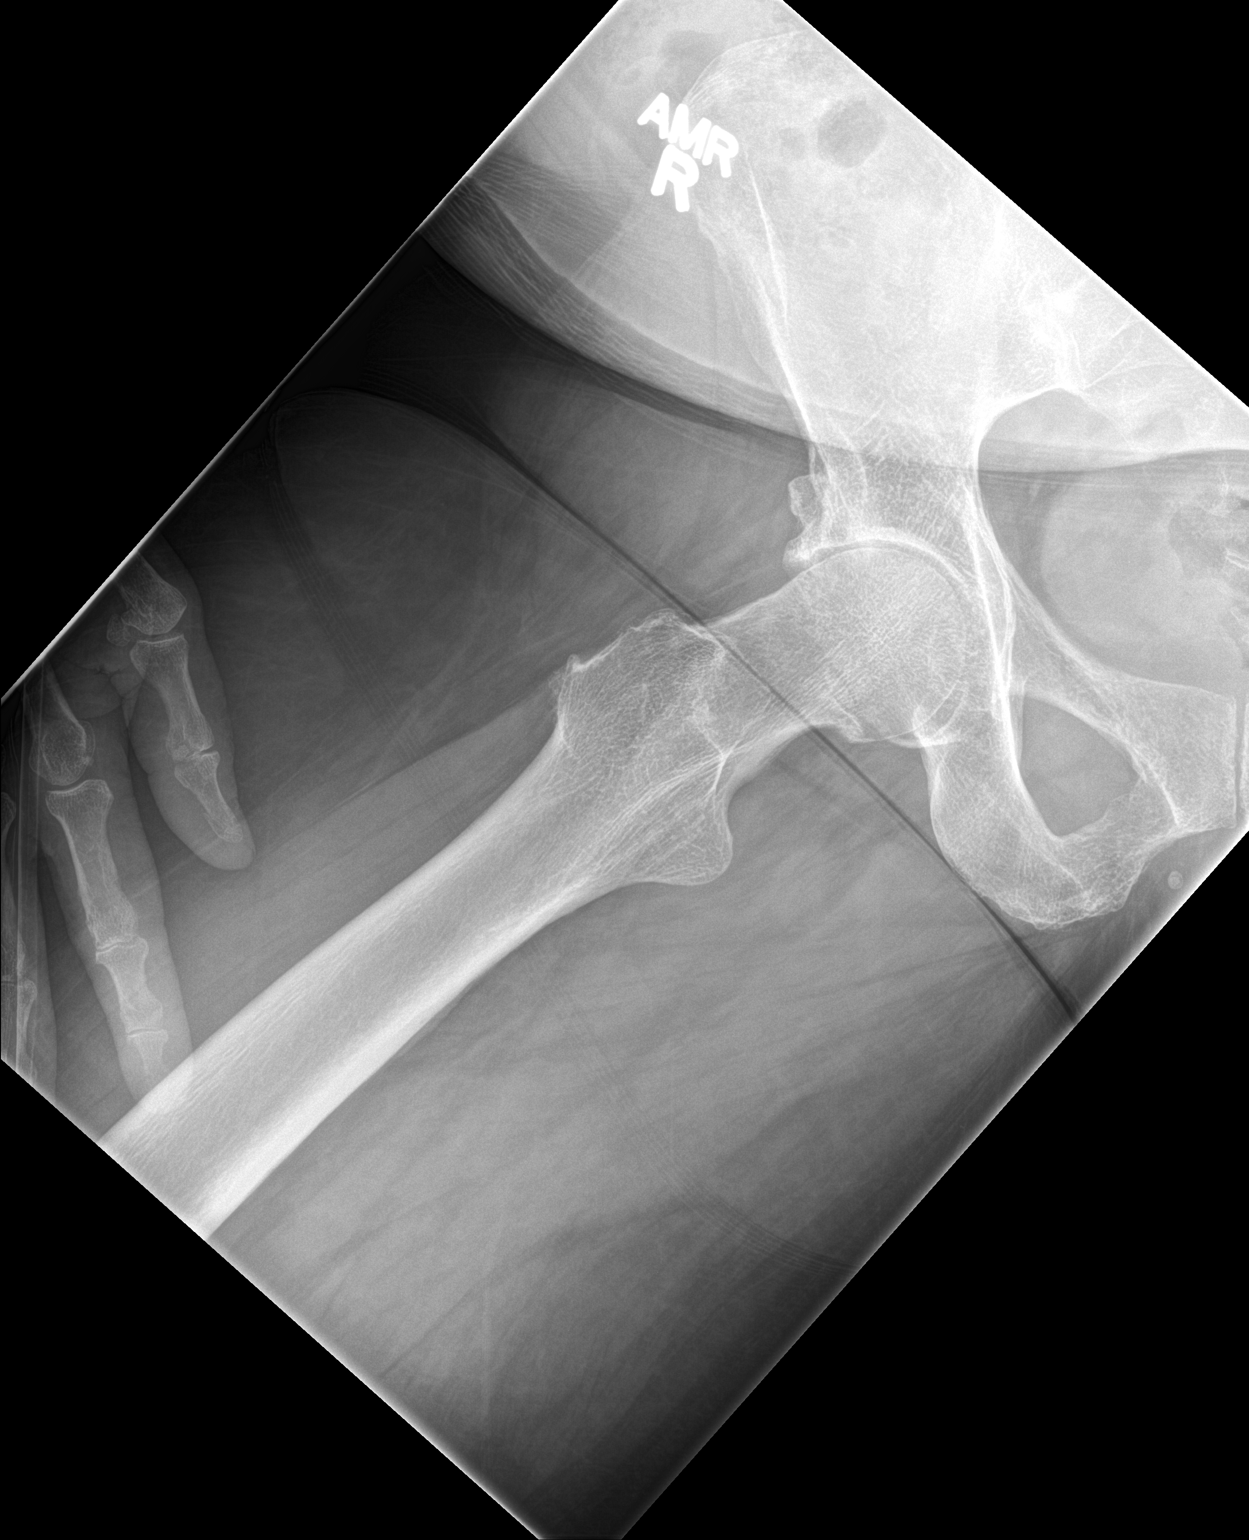

[3 of 3 positions shown; findings below may reference images not displayed]

FINDINGS: Degenerative changes lumbar spine and both hips. No acute bony
abnormality identified. No evidence of fracture or dislocation.
Sclerotic density none the sacrum most likely a bone island.
IMPRESSION: Degenerative changes lumbar spine and both hips. No acute
abnormality.

## 2019-02-10 ENCOUNTER — Other Ambulatory Visit: Payer: Self-pay

## 2019-02-10 ENCOUNTER — Ambulatory Visit (INDEPENDENT_AMBULATORY_CARE_PROVIDER_SITE_OTHER): Payer: Medicare Other | Admitting: Internal Medicine

## 2019-02-10 ENCOUNTER — Encounter (INDEPENDENT_AMBULATORY_CARE_PROVIDER_SITE_OTHER): Payer: Self-pay | Admitting: Internal Medicine

## 2019-02-10 VITALS — BP 150/88 | HR 72 | Ht 66.0 in | Wt 203.8 lb

## 2019-02-10 DIAGNOSIS — E559 Vitamin D deficiency, unspecified: Secondary | ICD-10-CM

## 2019-02-10 DIAGNOSIS — E119 Type 2 diabetes mellitus without complications: Secondary | ICD-10-CM

## 2019-02-10 DIAGNOSIS — E669 Obesity, unspecified: Secondary | ICD-10-CM | POA: Diagnosis not present

## 2019-02-10 DIAGNOSIS — E66811 Obesity, class 1: Secondary | ICD-10-CM

## 2019-02-10 DIAGNOSIS — I10 Essential (primary) hypertension: Secondary | ICD-10-CM

## 2019-02-10 DIAGNOSIS — E039 Hypothyroidism, unspecified: Secondary | ICD-10-CM

## 2019-02-10 DIAGNOSIS — E782 Mixed hyperlipidemia: Secondary | ICD-10-CM

## 2019-02-10 DIAGNOSIS — Z1159 Encounter for screening for other viral diseases: Secondary | ICD-10-CM

## 2019-02-10 HISTORY — DX: Hypothyroidism, unspecified: E03.9

## 2019-02-10 HISTORY — DX: Obesity, unspecified: E66.9

## 2019-02-10 HISTORY — DX: Obesity, class 1: E66.811

## 2019-02-10 HISTORY — DX: Vitamin D deficiency, unspecified: E55.9

## 2019-02-10 HISTORY — DX: Type 2 diabetes mellitus without complications: E11.9

## 2019-02-10 NOTE — Progress Notes (Signed)
Wellness Office Visit  Subjective:  Patient ID: Jeanette Compton, female    DOB: 1956/01/21  Age: 63 y.o. MRN: [id number]  CC: This lady comes in for follow-up of her chronic medical conditions which include diabetes, hypertension, hypothyroidism, obesity, vitamin D deficiency. HPI  She is doing reasonably well.  She continues with her diabetes being controlled with diet alone.  She did see an eye doctor about a year ago.  There was no evidence of retinopathy.  She denies any paresthesia in her hands or feet. She continues to take desiccated NP thyroid for her hypothyroidism and since we increase the dose, she feels more energized and does not feel cold in her peripheries anymore. She continues with antihypertensive medications.  She does not thankfully have any history of coronary artery disease or cerebrovascular disease.  She denies any chest pain, dyspnea, palpitations or limb weakness. As far as her hyperlipidemia is concerned, she has not required statin therapy. Past Medical History:  Diagnosis Date  . Arthritis    knee , ankle and back  . Bronchitis   . Cataract   . Diabetes mellitus without complication ([place]) 123XX123  . Hyperlipidemia   . Hypertension   . Hypothyroidism, adult 02/10/2019  . Neuralgia, post-herpetic 11/13/2016  . Obesity (BMI 30.0-34.9) 02/10/2019  . Thyroid disease   . Vitamin D deficiency disease 02/10/2019      Family History  Problem Relation Age of Onset  . Stroke Mother   . Diabetes Mother   . Alcohol abuse Father   . Early death Father   . Early death Sister        pneumonia  . Cancer Brother   . Alzheimer's disease Sister   . COPD Brother   . Colon cancer Neg Hx     Social History   Social History Narrative      Comments: Divorced x 3. Single. Has 3 children (2 boys and 1 girl), 9 grandchildren, 1 great-grandchild. Disability from arthritis in back. 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        Current Meds  Medication Sig  . aspirin 81 MG chewable tablet Chew 1 tablet (81 mg total) by mouth 2 (two) times daily.  . Cholecalciferol (VITAMIN D3) 125 MCG (5000 UT) CAPS Take 2 capsules by mouth daily.   . hydrochlorothiazide (MICROZIDE) 12.5 MG capsule Take 12.5 mg by mouth daily.  Marland Kitchen losartan (COZAAR) 50 MG tablet Take 50 mg by mouth daily.  . NP THYROID 120 MG tablet Take 120 mg by mouth daily before breakfast.     Nutrition  She does intermittent fasting mostly every day and sometimes she can go for 24 hours but typically does 16 to 18 hours every day. Sleep  Sleep is adequate, typically 6 to 8 hours every night uninterrupted.  Exercise  She is unable to go to the gym and is frustrated by this. Bio Identical Hormones  Desiccated NP thyroid.  Objective:   Today's Vitals: BP (!) 150/88   Pulse 72   Ht 5\' 6"  (1.676 m)   Wt 203 lb 12.8 oz (92.4 kg)   BMI 32.89 kg/m  Vitals with BMI 02/10/2019 07/31/2018 07/31/2018  Height 5\' 6"  - -  Weight 203 lbs 13 oz - -  BMI 123XX123 - -  Systolic Q000111Q 96 -  Diastolic 88 62 -  Pulse 72 - 62     Physical Exam  She looks systemically well.  She has lost about 4 pounds since the last visit in this office.  No other new physical findings.     Assessment   1. Mixed hyperlipidemia   2. Essential hypertension   3. Hypothyroidism, adult   4. Obesity (BMI 30.0-34.9)   5. Vitamin D deficiency disease   6. Diabetes mellitus without complication ([place])   7. Encounter for hepatitis C screening test for low risk patient       Tests ordered Orders Placed This Encounter  Procedures  . Hepatitis C antibody  . Hemoglobin A1c  . T3, free  . TSH  . VITAMIN D 25 Hydroxy (Vit-D Deficiency, Fractures)  . COMPLETE METABOLIC PANEL WITH GFR  . Lipid panel     Plan: 1. She will continue with all medications for chronic conditions above. 2. Blood work as ordered above. 3. I recommended that she walk briskly every  day for 30 minutes and also start using some of the weights that she has at home for strength training. 4. Further recommendations will depend on blood results and I will have her follow-up with Sarah in about 3 months for an annual physical exam.     Doree Albee, MD

## 2019-02-10 NOTE — Patient Instructions (Signed)
[facility] Dietary Recommendations for Weight Loss What to Avoid . Avoid added sugars o Often added sugar can be found in processed foods such as many condiments, dry cereals, cakes, cookies, chips, crisps, crackers, candies, sweetened drinks, etc.  o Read labels and AVOID/DECREASE use of foods with the following in their ingredient list: Sugar, fructose, high fructose corn syrup, sucrose, glucose, maltose, dextrose, molasses, cane sugar, brown sugar, any type of syrup, agave nectar, etc.   . Avoid snacking in between meals . Avoid foods made with flour o If you are going to eat food made with flour, choose those made with whole-grains; and, minimize your consumption as much as is tolerable . Avoid processed foods o These foods are generally stocked in the middle of the grocery store. Focus on shopping on the perimeter of the grocery.  What to Include . Vegetables o GREEN LEAFY VEGETABLES: Kale, spinach, mustard greens, collard greens, cabbage, broccoli, etc. o OTHER: Asparagus, cauliflower, eggplant, carrots, peas, Brussel sprouts, tomatoes, bell peppers, zucchini, beets, cucumbers, etc. . Grains, seeds, and legumes o Beans: kidney beans, black eyed peas, garbanzo beans, black beans, pinto beans, etc. o Whole, unrefined grains: brown rice, barley, bulgur, oatmeal, etc. . Healthy fats  o Avoid highly processed fats such as vegetable oil o Examples of healthy fats: avocado, olives, virgin olive oil, dark chocolate (?72% Cocoa), nuts (peanuts, almonds, walnuts, cashews, pecans, etc.) . Low - Moderate Intake of Animal Sources of Protein o Meat sources: chicken, turkey, salmon, tuna. Limit to 4 ounces of meat at one time. o Consider limiting dairy sources, but when choosing dairy focus on: PLAIN Greek yogurt, cottage cheese, high-protein milk . Fruit o Choose berries  When to Eat . Intermittent Fasting: o Choosing not to eat for a specific time period, but DO FOCUS ON HYDRATION  when fasting o Multiple Techniques: - Time Restricted Eating: eat 3 meals in a day, each meal lasting no more than 60 minutes, no snacks between meals - 16-18 hour fast: fast for 16 to 18 hours up to 7 days a week. Often suggested to start with 2-3 nonconsecutive days per week.  . Remember the time you sleep is counted as fasting.  . Examples of eating schedule: Fast from 7:00pm-11:00am. Eat between 11:00am-7:00pm.  - 24-hour fast: fast for 24 hours up to every other day. Often suggested to start with 1 day per week . Remember the time you sleep is counted as fasting . Examples of eating schedule:  o Eating day: eat 2-3 meals on your eating day. If doing 2 meals, each meal should last no more than 90 minutes. If doing 3 meals, each meal should last no more than 60 minutes. Finish last meal by 7:00pm. o Fasting day: Fast until 7:00pm.  o IF YOU FEEL UNWELL FOR ANY REASON/IN ANY WAY WHEN FASTING, STOP FASTING BY EATING A NUTRITIOUS SNACK OR LIGHT MEAL o ALWAYS FOCUS ON HYDRATION DURING FASTS - Acceptable Hydration sources: water, broths, tea/coffee (black tea/coffee is best but using a small amount of whole-fat dairy products in coffee/tea is acceptable).  - Poor Hydration Sources: anything with sugar or artificial sweeteners added to it  These recommendations have been developed for patients that are actively receiving medical care from either Dr. Melaina Howerton or Sarah Gray, DNP, NP-C at [facility]. These recommendations are developed for patients with specific medical conditions and are not meant to be distributed or used by others that are not actively receiving care from either provider listed   above at [facility]. It is not appropriate to participate in the above eating plans without proper medical supervision.   Reference: Fung, J. The obesity code. [place]: [place]; 2016.   

## 2019-02-11 ENCOUNTER — Encounter (INDEPENDENT_AMBULATORY_CARE_PROVIDER_SITE_OTHER): Payer: Self-pay | Admitting: Internal Medicine

## 2019-02-11 LAB — COMPLETE METABOLIC PANEL WITH GFR
AG Ratio: 1.1 (calc) (ref 1.0–2.5)
ALT: 12 U/L (ref 6–29)
AST: 15 U/L (ref 10–35)
Albumin: 4.1 g/dL (ref 3.6–5.1)
Alkaline phosphatase (APISO): 91 U/L (ref 37–153)
BUN: 15 mg/dL (ref 7–25)
CO2: 25 mmol/L (ref 20–32)
Calcium: 9.8 mg/dL (ref 8.6–10.4)
Chloride: 101 mmol/L (ref 98–110)
Creat: 0.64 mg/dL (ref 0.50–0.99)
GFR, Est African American: 111 mL/min/{1.73_m2} (ref >=60)
GFR, Est Non African American: 96 mL/min/{1.73_m2} (ref >=60)
Globulin: 3.6 g/dL (calc) (ref 1.9–3.7)
Glucose, Bld: 105 mg/dL — ABNORMAL HIGH (ref 65–99)
Potassium: 4.1 mmol/L (ref 3.5–5.3)
Sodium: 139 mmol/L (ref 135–146)
Total Bilirubin: 0.5 mg/dL (ref 0.2–1.2)
Total Protein: 7.7 g/dL (ref 6.1–8.1)

## 2019-02-11 LAB — LIPID PANEL
Cholesterol: 217 mg/dL — ABNORMAL HIGH (ref <200)
HDL: 50 mg/dL (ref >=50)
LDL Cholesterol (Calc): 139 mg/dL (calc) — ABNORMAL HIGH
Non-HDL Cholesterol (Calc): 167 mg/dL (calc) — ABNORMAL HIGH (ref <130)
Total CHOL/HDL Ratio: 4.3 (calc) (ref <5.0)
Triglycerides: 152 mg/dL — ABNORMAL HIGH (ref <150)

## 2019-02-11 LAB — HEPATITIS C ANTIBODY
Hepatitis C Ab: NONREACTIVE
SIGNAL TO CUT-OFF: 0.08 (ref <1.00)

## 2019-02-11 LAB — TSH: TSH: 0.18 mIU/L — ABNORMAL LOW (ref 0.40–4.50)

## 2019-02-11 LAB — HEMOGLOBIN A1C
Hgb A1c MFr Bld: 7.1 % of total Hgb — ABNORMAL HIGH (ref <5.7)
Mean Plasma Glucose: 157 (calc)
eAG (mmol/L): 8.7 (calc)

## 2019-02-11 LAB — T3, FREE: T3, Free: 5.4 pg/mL — ABNORMAL HIGH (ref 2.3–4.2)

## 2019-02-11 LAB — VITAMIN D 25 HYDROXY (VIT D DEFICIENCY, FRACTURES): Vit D, 25-Hydroxy: 57 ng/mL (ref 30–100)

## 2019-02-23 ENCOUNTER — Telehealth (INDEPENDENT_AMBULATORY_CARE_PROVIDER_SITE_OTHER): Payer: Self-pay

## 2019-02-23 ENCOUNTER — Other Ambulatory Visit (INDEPENDENT_AMBULATORY_CARE_PROVIDER_SITE_OTHER): Payer: Self-pay | Admitting: Internal Medicine

## 2019-02-23 MED ORDER — THYROID 60 MG PO TABS: 120.0000 mg | ORAL_TABLET | Freq: Every day | ORAL | 2 refills | Status: DC

## 2019-02-23 NOTE — Telephone Encounter (Signed)
Tell patient that I have sent a new prescription for NP thyroid 60 mg tablets, take 2 daily to [facility].

## 2019-03-10 ENCOUNTER — Other Ambulatory Visit: Payer: Self-pay

## 2019-03-10 ENCOUNTER — Ambulatory Visit (INDEPENDENT_AMBULATORY_CARE_PROVIDER_SITE_OTHER): Payer: Medicare Other

## 2019-03-10 DIAGNOSIS — Z23 Encounter for immunization: Secondary | ICD-10-CM | POA: Diagnosis not present

## 2019-03-27 ENCOUNTER — Other Ambulatory Visit (INDEPENDENT_AMBULATORY_CARE_PROVIDER_SITE_OTHER): Payer: Self-pay | Admitting: Internal Medicine

## 2019-03-30 ENCOUNTER — Other Ambulatory Visit (INDEPENDENT_AMBULATORY_CARE_PROVIDER_SITE_OTHER): Payer: Self-pay

## 2019-03-30 MED ORDER — NP THYROID 120 MG PO TABS: 120.0000 mg | ORAL_TABLET | Freq: Every day | ORAL | 3 refills | Status: DC

## 2019-04-28 ENCOUNTER — Other Ambulatory Visit (HOSPITAL_COMMUNITY): Payer: Self-pay | Admitting: Internal Medicine

## 2019-04-28 DIAGNOSIS — Z1231 Encounter for screening mammogram for malignant neoplasm of breast: Secondary | ICD-10-CM

## 2019-05-13 ENCOUNTER — Encounter (INDEPENDENT_AMBULATORY_CARE_PROVIDER_SITE_OTHER): Payer: Medicare Other | Admitting: Nurse Practitioner

## 2019-06-09 ENCOUNTER — Encounter (INDEPENDENT_AMBULATORY_CARE_PROVIDER_SITE_OTHER): Payer: Self-pay | Admitting: Nurse Practitioner

## 2019-06-09 ENCOUNTER — Ambulatory Visit (INDEPENDENT_AMBULATORY_CARE_PROVIDER_SITE_OTHER): Payer: Medicare Other | Admitting: Nurse Practitioner

## 2019-06-09 ENCOUNTER — Other Ambulatory Visit: Payer: Self-pay

## 2019-06-09 VITALS — BP 136/82 | HR 80 | Temp 96.7°F | Ht 66.0 in | Wt 212.8 lb

## 2019-06-09 DIAGNOSIS — N951 Menopausal and female climacteric states: Secondary | ICD-10-CM | POA: Diagnosis not present

## 2019-06-09 DIAGNOSIS — I10 Essential (primary) hypertension: Secondary | ICD-10-CM

## 2019-06-09 DIAGNOSIS — E559 Vitamin D deficiency, unspecified: Secondary | ICD-10-CM

## 2019-06-09 DIAGNOSIS — E119 Type 2 diabetes mellitus without complications: Secondary | ICD-10-CM

## 2019-06-09 DIAGNOSIS — E039 Hypothyroidism, unspecified: Secondary | ICD-10-CM

## 2019-06-09 DIAGNOSIS — Z0001 Encounter for general adult medical examination with abnormal findings: Secondary | ICD-10-CM

## 2019-06-09 DIAGNOSIS — E782 Mixed hyperlipidemia: Secondary | ICD-10-CM

## 2019-06-09 NOTE — Assessment & Plan Note (Signed)
I did discuss that her cholesterol panel is not optimal.  We did discuss lifestyle changes focusing mainly on diet that she can focus on to help improve her cholesterol panel.  I also discussed that guidelines would recommend that she is on statin therapy due to the fact that she is also diabetic.  She tells me she has tried statin therapy in the past and had negative side effects which is why she discontinued it.  She is not interested in restarting a statin at this time.  I did explain to her that guidelines would recommend she be on a statin due to risk of heart attack or stroke based on her history of being a diabetic.  I told her let me know if she would like to try another statin in the future, she tells me she will let me know.

## 2019-06-09 NOTE — Patient Instructions (Signed)
Thank you for choosing [facility] as your medical provider! If you have any questions or concerns regarding your health care, please do not hesitate to call our office.  We are collecting blood work today.  If you would like the tetanus shot or shingles vaccine please call our office.  We can give you tetanus shot here, but you would need to get your shingles vaccine from your pharmacy. Consider trying CeraVe lotion for dry skin. Use vaginal lubricants as needed.  Please follow-up as scheduled in 3 months. We look forward to seeing you again soon!   At [facility] we value your feedback. You may receive a survey about your visit today. Please share your experience as we strive to create trusting relationships with our patients to provide genuine, compassionate, quality care.  We appreciate your understanding and patience as we review any laboratory studies, imaging, and other diagnostic tests that are ordered as we care for you. We do our best to address any and all results in a timely manner. If you do not hear about test results within 1 week, please do not hesitate to contact us. If we referred you to a specialist during your visit or ordered imaging testing, contact the office if you have not been contacted to be scheduled within 1 weeks.  We also encourage the use of MyChart, which contains your medical information for your review as well. If you are not enrolled in this feature, an access code is on this after visit summary for your convenience. Thank you for allowing [place] to be involved in your care.

## 2019-06-09 NOTE — Assessment & Plan Note (Signed)
He tells me she is tolerating her NP thyroid dose well.  We will collect thyroid panel for further evaluation today.

## 2019-06-09 NOTE — Assessment & Plan Note (Signed)
She will continue on her current supplement.  We will collect blood work today for further evaluation.

## 2019-06-09 NOTE — Assessment & Plan Note (Signed)
She will continue taking her medications as prescribed.

## 2019-06-09 NOTE — Assessment & Plan Note (Signed)
Foot exam completed today.  We will collect A1c for further evaluation.  She will continue all medication as prescribed for now.

## 2019-06-09 NOTE — Assessment & Plan Note (Signed)
I offered to administer the tetanus shot today.  She declined this.  I encouraged her to get the shingles vaccine at her pharmacy if she would like to do this.  I encouraged her to go to her appointment tomorrow for her mammogram.  She tells me she will.  Depression screen was negative today.

## 2019-06-09 NOTE — Progress Notes (Signed)
Subjective:  Patient ID: Jeanette Compton, female    DOB: 1956/02/12  Age: 64 y.o. MRN: [id number]  CC:  Chief Complaint  Patient presents with  . Annual Exam      HPI  This patient arrives today for her annual physical exam.  Only complaint that she has today is that she does experience vaginal dryness at times.  She is wondering what she can take for this.  She is up-to-date with immunizations except for shingles and tetanus vaccine.  She is up-to-date with most screenings.  She would be due for sexual transmitted infection screening and breast cancer screening via mammogram.  She is scheduled for mammogram tomorrow.  She declines the need for sex transmitted infection screening today.  She did have blood work collected about 4 months ago.  Past Medical History:  Diagnosis Date  . Arthritis    knee , ankle and back  . Bronchitis   . Cataract   . Diabetes mellitus without complication ([place]) 3/89/3734  . Hyperlipidemia   . Hypertension   . Hypothyroidism, adult 02/10/2019  . Neuralgia, post-herpetic 11/13/2016  . Obesity (BMI 30.0-34.9) 02/10/2019  . Thyroid disease   . Vitamin D deficiency disease 02/10/2019      Family History  Problem Relation Age of Onset  . Stroke Mother   . Diabetes Mother   . Alcohol abuse Father   . Early death Father   . Early death Sister        pneumonia  . Cancer Brother   . Alzheimer's disease Sister   . COPD Brother   . Colon cancer Neg Hx     Social History   Social History Narrative      Comments: Divorced x 3. Single. Has 3 children (2 boys and 1 girl), 9 grandchildren, 1 great-grandchild. Disability from arthritis in back. 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      Social History   Tobacco Use  . Smoking status: Former Smoker    Packs/day: 0.50    Years: 3.00    Pack years: 1.50    Types: Cigarettes    Quit date: 1981    Years since quitting: 40.0  . Smokeless  tobacco: Never Used  Substance Use Topics  . Alcohol use: No     Current Meds  Medication Sig  . Cholecalciferol (VITAMIN D3) 125 MCG (5000 UT) CAPS Take 2 capsules by mouth daily.   . hydrochlorothiazide (MICROZIDE) 12.5 MG capsule Take 12.5 mg by mouth daily.  Marland Kitchen losartan (COZAAR) 50 MG tablet Take 50 mg by mouth daily.  . Multiple Vitamin (MULTIVITAMIN WITH MINERALS) TABS tablet Take 1 tablet by mouth daily. One-A-Day  . Multiple Vitamins-Minerals (HAIR SKIN AND NAILS FORMULA) TABS Take 2 tablets by mouth daily.  . naproxen (NAPROSYN) 500 MG tablet Take 500 mg by mouth 2 (two) times daily as needed.  . NP THYROID 120 MG tablet Take 1 tablet (120 mg total) by mouth daily.    ROS:  Review of Systems  Constitutional: Negative for fever and malaise/fatigue.  HENT: Negative for ear discharge, ear pain and hearing loss.   Eyes: Positive for blurred vision (when not wearing reading glasses). Negative for double vision.  Respiratory: Negative for cough, shortness of breath and wheezing.   Cardiovascular: Negative for chest pain and palpitations.  Gastrointestinal: Negative for abdominal pain, blood in stool, heartburn, nausea and vomiting.  Neurological: Negative for dizziness, sensory change,  seizures, weakness and headaches.  Psychiatric/Behavioral: Negative for suicidal ideas.     Objective:   Today's Vitals: BP 136/82   Pulse 80   Temp (!) 96.7 F (35.9 C)   Ht '5\' 6"'$  (1.676 m)   Wt 212 lb 12.8 oz (96.5 kg)   SpO2 99%   BMI 34.35 kg/m  Vitals with BMI 06/09/2019 02/10/2019 07/31/2018  Height '5\' 6"'$  '5\' 6"'$  -  Weight 212 lbs 13 oz 203 lbs 13 oz -  BMI 17.91 50.56 -  Systolic 979 480 96  Diastolic 82 88 62  Pulse 80 72 -     Physical Exam Vitals reviewed.  Constitutional:      Appearance: Normal appearance.  HENT:     Head: Normocephalic and atraumatic.     Right Ear: Tympanic membrane, ear canal and external ear normal.     Left Ear: Tympanic membrane, ear canal and  external ear normal.  Eyes:     General:        Right eye: No discharge.        Left eye: No discharge.     Extraocular Movements: Extraocular movements intact.     Conjunctiva/sclera: Conjunctivae normal.     Pupils: Pupils are equal, round, and reactive to light.  Neck:     Vascular: No carotid bruit.  Cardiovascular:     Rate and Rhythm: Normal rate and regular rhythm.     Pulses: Normal pulses.          Dorsalis pedis pulses are 2+ on the right side and 2+ on the left side.     Heart sounds: Normal heart sounds. No murmur.  Pulmonary:     Effort: Pulmonary effort is normal.     Breath sounds: Normal breath sounds.  Chest:     Breasts: Breasts are symmetrical.        Right: Normal.        Left: Normal.  Abdominal:     General: Abdomen is flat. Bowel sounds are normal. There is no distension.     Palpations: Abdomen is soft. There is no mass.     Tenderness: There is no abdominal tenderness.  Musculoskeletal:        General: No tenderness.     Cervical back: Neck supple. No muscular tenderness.     Right lower leg: No edema.     Left lower leg: No edema.     Right foot: No deformity.     Left foot: No deformity.  Feet:     Right foot:     Protective Sensation: 10 sites tested. 10 sites sensed.     Skin integrity: Skin integrity normal.     Toenail Condition: Right toenails are normal.     Left foot:     Protective Sensation: 10 sites tested. 10 sites sensed.     Skin integrity: Skin integrity normal.     Toenail Condition: Left toenails are normal.  Lymphadenopathy:     Cervical: No cervical adenopathy.     Upper Body:     Right upper body: No supraclavicular adenopathy.     Left upper body: No supraclavicular adenopathy.  Skin:    General: Skin is warm and dry.  Neurological:     General: No focal deficit present.     Mental Status: She is alert and oriented to person, place, and time.     Motor: No weakness.     Gait: Gait normal.  Psychiatric:  Mood  and Affect: Mood normal.        Behavior: Behavior normal.        Judgment: Judgment normal.      Office Visit from 06/09/2019 in [facility]  PHQ-2 Total Score  0           Assessment   1. Vaginal dryness, menopausal   2. Essential hypertension   3. Hypothyroidism, adult   4. Diabetes mellitus without complication ([place])   5. Vitamin D deficiency disease       Tests ordered Orders Placed This Encounter  Procedures  . TSH  . T3, Free  . T4, Free  . Hemoglobin A1c  . CMP with eGFR([facility])  . Vitamin D, 25-hydroxy     Plan: Please see assessment and plan per problem list below.   No orders of the defined types were placed in this encounter.   Patient to follow-up in 3 months. I spent 41 minutes dedicated to the care of this patient on the date of this encounter which includes a combination of either face-to-face or virtual contact with the patient, review of records , and ordering of tests and/or procedures.  Ailene Ards, NP

## 2019-06-10 ENCOUNTER — Telehealth (INDEPENDENT_AMBULATORY_CARE_PROVIDER_SITE_OTHER): Payer: Self-pay | Admitting: Nurse Practitioner

## 2019-06-10 ENCOUNTER — Encounter (INDEPENDENT_AMBULATORY_CARE_PROVIDER_SITE_OTHER): Payer: Self-pay | Admitting: Nurse Practitioner

## 2019-06-10 ENCOUNTER — Ambulatory Visit (HOSPITAL_COMMUNITY)
Admission: RE | Admit: 2019-06-10 | Discharge: 2019-06-10 | Disposition: A | Discharge status: Home / Self Care | Payer: Medicare Other | Source: Ambulatory Visit | Attending: Internal Medicine | Admitting: Internal Medicine

## 2019-06-10 DIAGNOSIS — E559 Vitamin D deficiency, unspecified: Secondary | ICD-10-CM

## 2019-06-10 DIAGNOSIS — E039 Hypothyroidism, unspecified: Secondary | ICD-10-CM

## 2019-06-10 DIAGNOSIS — Z1231 Encounter for screening mammogram for malignant neoplasm of breast: Secondary | ICD-10-CM | POA: Diagnosis not present

## 2019-06-10 DIAGNOSIS — E119 Type 2 diabetes mellitus without complications: Secondary | ICD-10-CM

## 2019-06-10 DIAGNOSIS — I10 Essential (primary) hypertension: Secondary | ICD-10-CM

## 2019-06-10 LAB — T4, FREE: Free T4: 0.8 ng/dL (ref 0.8–1.8)

## 2019-06-10 LAB — COMPLETE METABOLIC PANEL WITH GFR
AG Ratio: 1 (calc) (ref 1.0–2.5)
ALT: 12 U/L (ref 6–29)
AST: 14 U/L (ref 10–35)
Albumin: 4 g/dL (ref 3.6–5.1)
Alkaline phosphatase (APISO): 107 U/L (ref 37–153)
BUN: 19 mg/dL (ref 7–25)
CO2: 29 mmol/L (ref 20–32)
Calcium: 9.8 mg/dL (ref 8.6–10.4)
Chloride: 101 mmol/L (ref 98–110)
Creat: 0.78 mg/dL (ref 0.50–0.99)
GFR, Est African American: 94 mL/min/{1.73_m2} (ref >=60)
GFR, Est Non African American: 81 mL/min/{1.73_m2} (ref >=60)
Globulin: 4 g/dL (calc) — ABNORMAL HIGH (ref 1.9–3.7)
Glucose, Bld: 158 mg/dL — ABNORMAL HIGH (ref 65–99)
Potassium: 4.7 mmol/L (ref 3.5–5.3)
Sodium: 139 mmol/L (ref 135–146)
Total Bilirubin: 0.5 mg/dL (ref 0.2–1.2)
Total Protein: 8 g/dL (ref 6.1–8.1)

## 2019-06-10 LAB — HEMOGLOBIN A1C
Hgb A1c MFr Bld: 7.6 % of total Hgb — ABNORMAL HIGH (ref <5.7)
Mean Plasma Glucose: 171 (calc)
eAG (mmol/L): 9.5 (calc)

## 2019-06-10 LAB — T3, FREE: T3, Free: 3.5 pg/mL (ref 2.3–4.2)

## 2019-06-10 LAB — VITAMIN D 25 HYDROXY (VIT D DEFICIENCY, FRACTURES): Vit D, 25-Hydroxy: 35 ng/mL (ref 30–100)

## 2019-06-10 LAB — TSH: TSH: 0.44 mIU/L (ref 0.40–4.50)

## 2019-06-10 NOTE — Telephone Encounter (Signed)
I have changes patient's follow-up appointment in April to a lab draw.  Please call her and schedule her a face-to-face appointment within the next 2 weeks of her lab draw.  If you have any questions please let me know.

## 2019-07-24 ENCOUNTER — Other Ambulatory Visit (INDEPENDENT_AMBULATORY_CARE_PROVIDER_SITE_OTHER): Payer: Self-pay | Admitting: Internal Medicine

## 2019-07-30 ENCOUNTER — Other Ambulatory Visit (INDEPENDENT_AMBULATORY_CARE_PROVIDER_SITE_OTHER): Payer: Self-pay | Admitting: Internal Medicine

## 2019-08-04 ENCOUNTER — Other Ambulatory Visit (INDEPENDENT_AMBULATORY_CARE_PROVIDER_SITE_OTHER): Payer: Self-pay | Admitting: Internal Medicine

## 2019-08-04 ENCOUNTER — Telehealth (INDEPENDENT_AMBULATORY_CARE_PROVIDER_SITE_OTHER): Payer: Self-pay | Admitting: Internal Medicine

## 2019-08-04 MED ORDER — LOSARTAN POTASSIUM 50 MG PO TABS: 50.0000 mg | ORAL_TABLET | Freq: Every day | ORAL | 0 refills | Status: DC

## 2019-08-04 NOTE — Telephone Encounter (Signed)
Done

## 2019-08-31 ENCOUNTER — Other Ambulatory Visit (INDEPENDENT_AMBULATORY_CARE_PROVIDER_SITE_OTHER): Payer: Self-pay | Admitting: Nurse Practitioner

## 2019-08-31 DIAGNOSIS — E039 Hypothyroidism, unspecified: Secondary | ICD-10-CM

## 2019-08-31 DIAGNOSIS — E559 Vitamin D deficiency, unspecified: Secondary | ICD-10-CM

## 2019-08-31 DIAGNOSIS — I10 Essential (primary) hypertension: Secondary | ICD-10-CM

## 2019-08-31 DIAGNOSIS — E119 Type 2 diabetes mellitus without complications: Secondary | ICD-10-CM

## 2019-09-02 ENCOUNTER — Telehealth (INDEPENDENT_AMBULATORY_CARE_PROVIDER_SITE_OTHER): Payer: Self-pay

## 2019-09-02 DIAGNOSIS — E039 Hypothyroidism, unspecified: Secondary | ICD-10-CM

## 2019-09-02 MED ORDER — THYROID 120 MG PO TABS: ORAL_TABLET | ORAL | 3 refills | Status: DC

## 2019-09-02 NOTE — Telephone Encounter (Signed)
Jeanette Compton, CMA  

## 2019-09-07 ENCOUNTER — Telehealth (INDEPENDENT_AMBULATORY_CARE_PROVIDER_SITE_OTHER): Payer: Self-pay

## 2019-09-08 ENCOUNTER — Other Ambulatory Visit (INDEPENDENT_AMBULATORY_CARE_PROVIDER_SITE_OTHER): Payer: Medicare Other

## 2019-09-08 ENCOUNTER — Other Ambulatory Visit: Payer: Self-pay

## 2019-09-09 LAB — HEMOGLOBIN A1C
Hgb A1c MFr Bld: 7.5 % of total Hgb — ABNORMAL HIGH (ref <5.7)
Mean Plasma Glucose: 169 (calc)
eAG (mmol/L): 9.3 (calc)

## 2019-09-09 LAB — T3, FREE: T3, Free: 2.8 pg/mL (ref 2.3–4.2)

## 2019-09-09 LAB — T4, FREE: Free T4: 0.7 ng/dL — ABNORMAL LOW (ref 0.8–1.8)

## 2019-09-09 LAB — TSH: TSH: 1.33 mIU/L (ref 0.40–4.50)

## 2019-09-09 LAB — VITAMIN D 25 HYDROXY (VIT D DEFICIENCY, FRACTURES): Vit D, 25-Hydroxy: 56 ng/mL (ref 30–100)

## 2019-09-09 LAB — MICROALBUMIN / CREATININE URINE RATIO
Creatinine, Urine: 38 mg/dL (ref 20–275)
Microalb, Ur: <0.2 mg/dL

## 2019-09-09 NOTE — Telephone Encounter (Signed)
Pt is aware. Will need to  buy with good rx card.

## 2019-09-22 ENCOUNTER — Ambulatory Visit (INDEPENDENT_AMBULATORY_CARE_PROVIDER_SITE_OTHER): Payer: Medicare Other | Admitting: Nurse Practitioner

## 2019-10-06 ENCOUNTER — Telehealth (INDEPENDENT_AMBULATORY_CARE_PROVIDER_SITE_OTHER): Payer: Self-pay

## 2019-10-06 ENCOUNTER — Other Ambulatory Visit (INDEPENDENT_AMBULATORY_CARE_PROVIDER_SITE_OTHER): Payer: Self-pay | Admitting: Internal Medicine

## 2019-10-06 DIAGNOSIS — E039 Hypothyroidism, unspecified: Secondary | ICD-10-CM

## 2019-10-06 MED ORDER — THYROID 120 MG PO TABS: ORAL_TABLET | ORAL | 3 refills | Status: DC

## 2019-10-06 NOTE — Telephone Encounter (Signed)
Jeanette Compton is stating that her NP Thyroid has been recalled and she needs a new Rx sent to [facility], please advise?

## 2019-10-22 ENCOUNTER — Telehealth (INDEPENDENT_AMBULATORY_CARE_PROVIDER_SITE_OTHER): Payer: Self-pay

## 2019-10-22 NOTE — Telephone Encounter (Signed)
She will need to be seen if she thinks she has a urinary tract infection.  She can take over-the-counter Pyridium which might help some symptoms.  If she is able to wait, please schedule her for an office visit next week.  Otherwise, she will need to go to an urgent care to be evaluated and treated.

## 2019-10-22 NOTE — Telephone Encounter (Signed)
So pt was called about needing a appt. She will see Judson Roch on Tues 6/8 @ 4pm. Will let her know then to check her for UTI.

## 2019-10-27 ENCOUNTER — Encounter (INDEPENDENT_AMBULATORY_CARE_PROVIDER_SITE_OTHER): Payer: Self-pay | Admitting: Nurse Practitioner

## 2019-10-27 ENCOUNTER — Other Ambulatory Visit: Payer: Self-pay

## 2019-10-27 ENCOUNTER — Ambulatory Visit (INDEPENDENT_AMBULATORY_CARE_PROVIDER_SITE_OTHER): Payer: Medicare Other | Admitting: Nurse Practitioner

## 2019-10-27 VITALS — BP 155/95 | HR 81 | Temp 97.5°F | Ht 66.0 in | Wt 207.0 lb

## 2019-10-27 DIAGNOSIS — R309 Painful micturition, unspecified: Secondary | ICD-10-CM

## 2019-10-27 DIAGNOSIS — E669 Obesity, unspecified: Secondary | ICD-10-CM

## 2019-10-27 DIAGNOSIS — N3090 Cystitis, unspecified without hematuria: Secondary | ICD-10-CM | POA: Diagnosis not present

## 2019-10-27 DIAGNOSIS — E119 Type 2 diabetes mellitus without complications: Secondary | ICD-10-CM | POA: Diagnosis not present

## 2019-10-27 LAB — POCT URINALYSIS DIPSTICK (MANUAL)
Leukocytes, UA: NEGATIVE
Nitrite, UA: NEGATIVE
Poct Bilirubin: NEGATIVE
Poct Blood: 250 — AB
Poct Glucose: NORMAL mg/dL
Poct Ketones: NEGATIVE
Poct Urobilinogen: NORMAL mg/dL
Spec Grav, UA: 1.01 (ref 1.010–1.025)
pH, UA: 6 (ref 5.0–8.0)

## 2019-10-27 MED ORDER — NITROFURANTOIN MONOHYD MACRO 100 MG PO CAPS: 100.0000 mg | ORAL_CAPSULE | Freq: Two times a day (BID) | ORAL | 0 refills | Status: DC

## 2019-10-27 NOTE — Progress Notes (Signed)
Subjective:  Patient ID: Jeanette Compton, female    DOB: 11/16/1955  Age: 64 y.o. MRN: [id number]  CC:  Chief Complaint  Patient presents with  . Urinary Tract Infection      HPI  This patient arrives today for the above.  She tells me approximately 1 week ago she started experiencing dysuria, pink-tinged urine, and was concerned she might have a UTI.  She has started taking Azo over-the-counter and been drinking cranberry juice and her symptoms have improved.  She denies any fever, abdominal pain, nausea, vomiting.  She also mentions to me that she continues to intermittently fast and has been trying to focus on her diet.  She is wondering if we can discuss dietary recommendations for both weight loss and type 2 diabetes.   Past Medical History:  Diagnosis Date  . Arthritis    knee , ankle and back  . Bronchitis   . Cataract   . Diabetes mellitus without complication ([place]) 08/17/760  . Hyperlipidemia   . Hypertension   . Hypothyroidism, adult 02/10/2019  . Neuralgia, post-herpetic 11/13/2016  . Obesity (BMI 30.0-34.9) 02/10/2019  . Thyroid disease   . Vitamin D deficiency disease 02/10/2019      Family History  Problem Relation Age of Onset  . Stroke Mother   . Diabetes Mother   . Alcohol abuse Father   . Early death Father   . Early death Sister        pneumonia  . Cancer Brother   . Alzheimer's disease Sister   . COPD Brother   . Colon cancer Neg Hx     Social History   Social History Narrative      Comments: Divorced x 3. Single. Has 3 children (2 boys and 1 girl), 9 grandchildren, 1 great-grandchild. Disability from arthritis in back. 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      Social History   Tobacco Use  . Smoking status: Former Smoker    Packs/day: 0.50    Years: 3.00    Pack years: 1.50    Types: Cigarettes    Quit date: 1981    Years since quitting: 40.4  . Smokeless tobacco: Never Used    Substance Use Topics  . Alcohol use: No     Current Meds  Medication Sig  . Cholecalciferol (VITAMIN D3) 125 MCG (5000 UT) CAPS Take 2 capsules by mouth daily.   . hydrochlorothiazide (MICROZIDE) 12.5 MG capsule TAKE 1 CAPSULE BY MOUTH ONCE A DAY.  Marland Kitchen losartan (COZAAR) 50 MG tablet Take 1 tablet (50 mg total) by mouth daily.  . Multiple Vitamin (MULTIVITAMIN WITH MINERALS) TABS tablet Take 1 tablet by mouth daily. One-A-Day  . Multiple Vitamins-Minerals (HAIR SKIN AND NAILS FORMULA) TABS Take 2 tablets by mouth daily.  . naproxen (NAPROSYN) 500 MG tablet Take 500 mg by mouth 2 (two) times daily as needed.  . thyroid (NP THYROID) 120 MG tablet Take 1 tablet daily by mouth    ROS:  See HPI   Objective:   Today's Vitals: BP (!) 155/95 (BP Location: Right Arm, Patient Position: Sitting, Cuff Size: Normal)   Pulse 81   Temp (!) 97.5 F (36.4 C) (Temporal)   Ht 5\' 6"  (1.676 m)   Wt 207 lb (93.9 kg)   SpO2 98%   BMI 33.41 kg/m  Vitals with BMI 10/27/2019 06/09/2019 02/10/2019  Height 5\' 6"  5\' 6"  5\' 6"   Weight  207 lbs 212 lbs 13 oz 203 lbs 13 oz  BMI 33.43 74.16 38.45  Systolic 364 680 321  Diastolic 95 82 88  Pulse 81 80 72     Physical Exam Vitals reviewed.  Constitutional:      General: She is not in acute distress.    Appearance: Normal appearance. She is obese.  HENT:     Head: Normocephalic and atraumatic.  Neck:     Vascular: No carotid bruit.  Cardiovascular:     Rate and Rhythm: Normal rate and regular rhythm.     Pulses: Normal pulses.     Heart sounds: Normal heart sounds.  Pulmonary:     Effort: Pulmonary effort is normal.     Breath sounds: Normal breath sounds.  Skin:    General: Skin is warm and dry.  Neurological:     General: No focal deficit present.     Mental Status: She is alert and oriented to person, place, and time.  Psychiatric:        Mood and Affect: Mood normal.        Behavior: Behavior normal.        Judgment: Judgment normal.           Assessment and Plan   1. Cystitis   2. Pain passing urine   3. Obesity (BMI 30.0-34.9)   4. Diabetes mellitus without complication ([place])      Plan: 1.,  2.  I believe she does have a urinary tract infection I will treat her empirically with Macrobid for 5 days.  I will also send her urine for UA and culture.  She is encouraged to continue taking her Azo as needed.  3, 4.  We did discuss diet and recommendations related to that as far as helping with weight loss and controlling her type diabetes.  I recommended she focus on whole foods, avoid processed carbohydrates, and consider a plant focused diet.   Tests ordered Orders Placed This Encounter  Procedures  . Urinalysis with Culture Reflex  . POCT Urinalysis Dip Manual      Meds ordered this encounter  Medications  . nitrofurantoin, macrocrystal-monohydrate, (MACROBID) 100 MG capsule    Sig: Take 1 capsule (100 mg total) by mouth 2 (two) times daily.    Dispense:  10 capsule    Refill:  0    Order Specific Question:   Supervising Provider    Answer:   Doree Albee [[id number]]    Patient to follow-up in 2 months or sooner as needed.  Ailene Ards, NP

## 2019-10-27 NOTE — Patient Instructions (Signed)
[facility] Dietary Recommendations for Weight Loss What to Avoid . Avoid added sugars o Often added sugar can be found in processed foods such as many condiments, dry cereals, cakes, cookies, chips, crisps, crackers, candies, sweetened drinks, etc.  o Read labels and AVOID/DECREASE use of foods with the following in their ingredient list: Sugar, fructose, high fructose corn syrup, sucrose, glucose, maltose, dextrose, molasses, cane sugar, brown sugar, any type of syrup, agave nectar, etc.   . Avoid snacking in between meals . Avoid foods made with flour o If you are going to eat food made with flour, choose those made with whole-grains; and, minimize your consumption as much as is tolerable . Avoid processed foods o These foods are generally stocked in the middle of the grocery store. Focus on shopping on the perimeter of the grocery.  . Avoid Meat  o We recommend following a plant-based diet at [facility]. Thus, we recommend avoiding meat as a general rule. Consider eating beans, legumes, eggs, and/or dairy products for regular protein sources o If you plan on eating meat limit to 4 ounces of meat at a time and choose lean options such as Fish, chicken, turkey. Avoid red meat intake such as pork and/or steak What to Include . Vegetables o GREEN LEAFY VEGETABLES: Kale, spinach, mustard greens, collard greens, cabbage, broccoli, etc. o OTHER: Asparagus, cauliflower, eggplant, carrots, peas, Brussel sprouts, tomatoes, bell peppers, zucchini, beets, cucumbers, etc. . Grains, seeds, and legumes o Beans: kidney beans, black eyed peas, garbanzo beans, black beans, pinto beans, etc. o Whole, unrefined grains: brown rice, barley, bulgur, oatmeal, etc. . Healthy fats  o Avoid highly processed fats such as vegetable oil o Examples of healthy fats: avocado, olives, virgin olive oil, dark chocolate (?72% Cocoa), nuts (peanuts, almonds, walnuts, cashews, pecans, etc.) . None to Low  Intake of Animal Sources of Protein o Meat sources: chicken, turkey, salmon, tuna. Limit to 4 ounces of meat at one time. o Consider limiting dairy sources, but when choosing dairy focus on: PLAIN Greek yogurt, cottage cheese, high-protein milk . Fruit o Choose berries  When to Eat . Intermittent Fasting: o Choosing not to eat for a specific time period, but DO FOCUS ON HYDRATION when fasting o Multiple Techniques: - Time Restricted Eating: eat 3 meals in a day, each meal lasting no more than 60 minutes, no snacks between meals - 16-18 hour fast: fast for 16 to 18 hours up to 7 days a week. Often suggested to start with 2-3 nonconsecutive days per week.  . Remember the time you sleep is counted as fasting.  . Examples of eating schedule: Fast from 7:00pm-11:00am. Eat between 11:00am-7:00pm.  - 24-hour fast: fast for 24 hours up to every other day. Often suggested to start with 1 day per week . Remember the time you sleep is counted as fasting . Examples of eating schedule:  o Eating day: eat 2-3 meals on your eating day. If doing 2 meals, each meal should last no more than 90 minutes. If doing 3 meals, each meal should last no more than 60 minutes. Finish last meal by 7:00pm. o Fasting day: Fast until 7:00pm.  o IF YOU FEEL UNWELL FOR ANY REASON/IN ANY WAY WHEN FASTING, STOP FASTING BY EATING A NUTRITIOUS SNACK OR LIGHT MEAL o ALWAYS FOCUS ON HYDRATION DURING FASTS - Acceptable Hydration sources: water, broths, tea/coffee (black tea/coffee is best but using a small amount of whole-fat dairy products in coffee/tea is acceptable).  -   Poor Hydration Sources: anything with sugar or artificial sweeteners added to it  These recommendations have been developed for patients that are actively receiving medical care from either Dr. [facility] or Lakendrick Paradis, DNP, NP-C at [facility]. These recommendations are developed for patients with specific medical conditions and are not meant to be  distributed or used by others that are not actively receiving care from either provider listed above at [facility]. It is not appropriate to participate in the above eating plans without proper medical supervision.   Reference: Fung, J. The obesity code. [place]: [place]; 2016.   

## 2019-10-30 ENCOUNTER — Other Ambulatory Visit (INDEPENDENT_AMBULATORY_CARE_PROVIDER_SITE_OTHER): Payer: Self-pay | Admitting: Nurse Practitioner

## 2019-10-30 DIAGNOSIS — N3090 Cystitis, unspecified without hematuria: Secondary | ICD-10-CM

## 2019-10-30 LAB — URINALYSIS W MICROSCOPIC + REFLEX CULTURE
Bacteria, UA: NONE SEEN /HPF
Bilirubin Urine: NEGATIVE
Glucose, UA: NEGATIVE
Hyaline Cast: NONE SEEN /LPF
Ketones, ur: NEGATIVE
Nitrites, Initial: NEGATIVE
Protein, ur: NEGATIVE
Specific Gravity, Urine: 1.006 (ref 1.001–1.03)
pH: 6.5 (ref 5.0–8.0)

## 2019-10-30 LAB — URINE CULTURE

## 2019-10-30 LAB — CULTURE INDICATED

## 2019-10-30 MED ORDER — CIPROFLOXACIN HCL 250 MG PO TABS: 250.0000 mg | ORAL_TABLET | Freq: Two times a day (BID) | ORAL | 0 refills | Status: DC

## 2019-10-30 NOTE — Progress Notes (Signed)
Patient's urine culture showed bacteria that is resistant to Macrobid.  I was then changed the antibiotic to Bactrim, however per our electronic medical record Bactrim is not a preferred agent by her insurance.  However ciprofloxacin is, I will send order for ciprofloxacin to her pharmacy.  I did leave her a voicemail letting her know this medication change and warning her of risk for tendon rupture so if she were to experience joint pain she should stop the medication as well as risk for diarrhea so if she were to experience that she should let us know as well.  I asked her to call us back on Monday if she has any questions.

## 2019-12-14 ENCOUNTER — Other Ambulatory Visit (INDEPENDENT_AMBULATORY_CARE_PROVIDER_SITE_OTHER): Payer: Self-pay | Admitting: Internal Medicine

## 2019-12-14 ENCOUNTER — Other Ambulatory Visit (INDEPENDENT_AMBULATORY_CARE_PROVIDER_SITE_OTHER): Payer: Self-pay

## 2019-12-14 ENCOUNTER — Telehealth (INDEPENDENT_AMBULATORY_CARE_PROVIDER_SITE_OTHER): Payer: Self-pay

## 2019-12-14 DIAGNOSIS — E039 Hypothyroidism, unspecified: Secondary | ICD-10-CM

## 2019-12-14 MED ORDER — THYROID 60 MG PO TABS
120.0000 mg | ORAL_TABLET | Freq: Every day | ORAL | 3 refills | Status: DC
Start: 2019-12-14 — End: 2019-12-28

## 2019-12-14 NOTE — Telephone Encounter (Signed)
Doneta is stating that [facility] is needing a new Rx for the NP Thyroid 120 the 120mg  is on back order so they want a Rx for 60mg  2 a day

## 2019-12-14 NOTE — Telephone Encounter (Signed)
Pt will STOP nop thyroid mediation until next OV on 8/9. Pt will wait until then.

## 2019-12-14 NOTE — Telephone Encounter (Signed)
Okay, tell her that I have sent NP thyroid 60 mg tablets, take 2 daily to the [place] on [address].

## 2019-12-14 NOTE — Telephone Encounter (Signed)
I would actually recommend that she discontinue the thyroid for now and we will repeat blood work when I see her next time.

## 2019-12-16 ENCOUNTER — Other Ambulatory Visit (INDEPENDENT_AMBULATORY_CARE_PROVIDER_SITE_OTHER): Payer: Self-pay | Admitting: Internal Medicine

## 2019-12-28 ENCOUNTER — Other Ambulatory Visit: Payer: Self-pay

## 2019-12-28 ENCOUNTER — Encounter (INDEPENDENT_AMBULATORY_CARE_PROVIDER_SITE_OTHER): Payer: Self-pay | Admitting: Internal Medicine

## 2019-12-28 ENCOUNTER — Ambulatory Visit (INDEPENDENT_AMBULATORY_CARE_PROVIDER_SITE_OTHER): Payer: Medicare Other | Admitting: Internal Medicine

## 2019-12-28 VITALS — BP 130/90 | HR 69 | Temp 97.2°F | Ht 66.0 in | Wt 209.8 lb

## 2019-12-28 DIAGNOSIS — E119 Type 2 diabetes mellitus without complications: Secondary | ICD-10-CM | POA: Diagnosis not present

## 2019-12-28 DIAGNOSIS — E039 Hypothyroidism, unspecified: Secondary | ICD-10-CM | POA: Diagnosis not present

## 2019-12-28 DIAGNOSIS — I10 Essential (primary) hypertension: Secondary | ICD-10-CM

## 2019-12-28 NOTE — Progress Notes (Signed)
Metrics: Intervention Frequency ACO  Documented Smoking Status Yearly  Screened one or more times in 24 months  Cessation Counseling or  Active cessation medication Past 24 months  Past 24 months   Guideline developer: UpToDate (See UpToDate for funding source) Date Released: 2014       Wellness Office Visit  Subjective:  Patient ID: ERNESTINE ROHMAN, female    DOB: 1955-11-06  Age: 64 y.o. MRN: [id number]  CC: This lady comes in for follow-up of hypothyroidism, hypertension, diabetes. HPI  She was having some possible side effects from desiccated NP thyroid and also it was becoming less cost effective so I told the patient to discontinue everything and we will check thyroid function again. As far as her diabetes is concerned, her last hemoglobin A1c was 7.5% and she is trying to control this with diet alone. She continues on losartan and hydrochlorthiazide for hypertension. Past Medical History:  Diagnosis Date  . Arthritis    knee , ankle and back  . Bronchitis   . Cataract   . Diabetes mellitus without complication ([place]) 12/13/3662  . Hyperlipidemia   . Hypertension   . Hypothyroidism, adult 02/10/2019  . Neuralgia, post-herpetic 11/13/2016  . Obesity (BMI 30.0-34.9) 02/10/2019  . Thyroid disease   . Vitamin D deficiency disease 02/10/2019   Past Surgical History:  Procedure Laterality Date  . ABDOMINAL HYSTERECTOMY     fibroid  . BREAST BIOPSY Right 07/31/2017   Procedure: EXCISIONAL BREAST BIOPSY WITH NEEDLE LOCALIZATION;  Surgeon: Virl Cagey, MD;  Location: [facility];  Service: General;  Laterality: Right;  . BREAST SURGERY Right   . COLONOSCOPY  09/13/2011   Procedure: COLONOSCOPY;  Surgeon: Rogene Houston, MD;  Location: [facility];  Service: Endoscopy;  Laterality: N/A;  1030  . COLONOSCOPY N/A 07/31/2018   Procedure: COLONOSCOPY;  Surgeon: Rogene Houston, MD;  [facility];  Service: Endoscopy;  Laterality: N/A;  . POLYPECTOMY  07/31/2018    Procedure: POLYPECTOMY;  Surgeon: Rogene Houston, MD;  [facility];  Service: Endoscopy;;  . TOTAL HIP ARTHROPLASTY Right 12/13/2017   Procedure: RIGHT TOTAL HIP ARTHROPLASTY ANTERIOR APPROACH;  Surgeon: Mcarthur Rossetti, MD;  Location: [facility];  Service: Orthopedics;  Laterality: Right;     Family History  Problem Relation Age of Onset  . Stroke Mother   . Diabetes Mother   . Alcohol abuse Father   . Early death Father   . Early death Sister        pneumonia  . Cancer Brother   . Alzheimer's disease Sister   . COPD Brother   . Colon cancer Neg Hx     Social History   Social History Narrative      Comments: Divorced x 3. Single. Has 3 children (2 boys and 1 girl), 9 grandchildren, 1 great-grandchild. Disability from arthritis in back. 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      Social History   Tobacco Use  . Smoking status: Former Smoker    Packs/day: 0.50    Years: 3.00    Pack years: 1.50    Types: Cigarettes    Quit date: 1981    Years since quitting: 40.6  . Smokeless tobacco: Never Used  Substance Use Topics  . Alcohol use: No    Current Meds  Medication Sig  . Cholecalciferol (VITAMIN D3) 125 MCG (5000 UT) CAPS Take 2 capsules by mouth daily.   Marland Kitchen  hydrochlorothiazide (MICROZIDE) 12.5 MG capsule TAKE 1 CAPSULE BY MOUTH ONCE A DAY.  Marland Kitchen losartan (COZAAR) 50 MG tablet Take 1 tablet (50 mg total) by mouth daily.  . Multiple Vitamin (MULTIVITAMIN WITH MINERALS) TABS tablet Take 1 tablet by mouth daily. One-A-Day  . Multiple Vitamins-Minerals (HAIR SKIN AND NAILS FORMULA) TABS Take 2 tablets by mouth daily.  . [DISCONTINUED] naproxen (NAPROSYN) 500 MG tablet Take 500 mg by mouth 2 (two) times daily as needed.  . [DISCONTINUED] thyroid (NP THYROID) 60 MG tablet Take 2 tablets (120 mg total) by mouth daily before breakfast.      Depression screen [facility] 2/9 06/09/2019 04/10/2017 03/12/2017 11/13/2016  09/10/2016  Decreased Interest 0 0 0 0 0  Down, Depressed, Hopeless 0 0 0 0 0  PHQ - 2 Score 0 0 0 0 0     Objective:   Today's Vitals: BP 130/90 (BP Location: Left Arm, Patient Position: Sitting, Cuff Size: Normal)   Pulse 69   Temp (!) 97.2 F (36.2 C) (Temporal)   Ht 5\' 6"  (1.676 m)   Wt 209 lb 12.8 oz (95.2 kg)   SpO2 98%   BMI 33.86 kg/m  Vitals with BMI 12/28/2019 10/27/2019 06/09/2019  Height 5\' 6"  5\' 6"  5\' 6"   Weight 209 lbs 13 oz 207 lbs 212 lbs 13 oz  BMI 33.88 14.97 02.63  Systolic 785 885 027  Diastolic 90 95 82  Pulse 69 81 80     Physical Exam  She looks systemically well.  She remains obese.  Blood pressure is better than the last visit.  She is alert and orientated without any focal neurological signs.     Assessment   1. Diabetes mellitus without complication ([place])   2. Hypothyroidism, adult   3. Essential hypertension       Tests ordered Orders Placed This Encounter  Procedures  . COMPLETE METABOLIC PANEL WITH GFR  . T3, free  . T4  . TSH  . Hemoglobin A1c     Plan: 1. She is currently diet controlled or is trying to be diet controlled and we will see what her A1c is.  I think we can improve the diet and help her diabetes become much better controlled and I will see her in a couple of weeks time to discuss this. 2. As far as her hypothyroid is concerned, I will repeat blood work today. 3. She will continue with losartan and hydrochlorothiazide at the same dose and we will check electrolytes today. 4. Follow-up in a couple of weeks.   No orders of the defined types were placed in this encounter.   Doree Albee, MD

## 2019-12-29 LAB — HEMOGLOBIN A1C
Hgb A1c MFr Bld: 7.5 % of total Hgb — ABNORMAL HIGH (ref <5.7)
Mean Plasma Glucose: 169 (calc)
eAG (mmol/L): 9.3 (calc)

## 2019-12-29 LAB — COMPLETE METABOLIC PANEL WITH GFR
AG Ratio: 1 (calc) (ref 1.0–2.5)
ALT: 13 U/L (ref 6–29)
AST: 18 U/L (ref 10–35)
Albumin: 4.1 g/dL (ref 3.6–5.1)
Alkaline phosphatase (APISO): 102 U/L (ref 37–153)
BUN: 17 mg/dL (ref 7–25)
CO2: 30 mmol/L (ref 20–32)
Calcium: 9.9 mg/dL (ref 8.6–10.4)
Chloride: 103 mmol/L (ref 98–110)
Creat: 0.77 mg/dL (ref 0.50–0.99)
GFR, Est African American: 95 mL/min/{1.73_m2} (ref >=60)
GFR, Est Non African American: 82 mL/min/{1.73_m2} (ref >=60)
Globulin: 4 g/dL (calc) — ABNORMAL HIGH (ref 1.9–3.7)
Glucose, Bld: 158 mg/dL — ABNORMAL HIGH (ref 65–99)
Potassium: 4.5 mmol/L (ref 3.5–5.3)
Sodium: 143 mmol/L (ref 135–146)
Total Bilirubin: 0.3 mg/dL (ref 0.2–1.2)
Total Protein: 8.1 g/dL (ref 6.1–8.1)

## 2019-12-29 LAB — TSH: TSH: 8.97 mIU/L — ABNORMAL HIGH (ref 0.40–4.50)

## 2019-12-29 LAB — T3, FREE: T3, Free: 2 pg/mL — ABNORMAL LOW (ref 2.3–4.2)

## 2019-12-29 LAB — T4: T4, Total: 3.5 ug/dL — ABNORMAL LOW (ref 5.1–11.9)

## 2020-01-12 ENCOUNTER — Ambulatory Visit (INDEPENDENT_AMBULATORY_CARE_PROVIDER_SITE_OTHER): Payer: Medicare Other | Admitting: Internal Medicine

## 2020-01-12 ENCOUNTER — Encounter (INDEPENDENT_AMBULATORY_CARE_PROVIDER_SITE_OTHER): Payer: Self-pay | Admitting: Internal Medicine

## 2020-01-12 ENCOUNTER — Other Ambulatory Visit: Payer: Self-pay

## 2020-01-12 VITALS — BP 140/80 | HR 87 | Temp 97.5°F | Resp 18 | Ht 66.0 in | Wt 212.0 lb

## 2020-01-12 DIAGNOSIS — E119 Type 2 diabetes mellitus without complications: Secondary | ICD-10-CM

## 2020-01-12 DIAGNOSIS — E039 Hypothyroidism, unspecified: Secondary | ICD-10-CM | POA: Diagnosis not present

## 2020-01-12 DIAGNOSIS — I10 Essential (primary) hypertension: Secondary | ICD-10-CM | POA: Diagnosis not present

## 2020-01-12 NOTE — Progress Notes (Signed)
Metrics: Intervention Frequency ACO  Documented Smoking Status Yearly  Screened one or more times in 24 months  Cessation Counseling or  Active cessation medication Past 24 months  Past 24 months   Guideline developer: UpToDate (See UpToDate for funding source) Date Released: 2014       Wellness Office Visit  Subjective:  Patient ID: Jeanette Compton, female    DOB: Jan 19, 1956  Age: 64 y.o. MRN: [id number]  CC: This lady comes in for follow-up of diabetes, hypothyroidism and hypertension. HPI  She had blood work done a few weeks ago and she has come here to discuss these results. Her hemoglobin A1c is 7.5% which does not show good control.  She is currently on no medication for diabetes. As far as her hypothyroidism is concerned, her T3 is very low and TSH is elevated.  She denies any significant symptoms of hypothyroidism today and she really does not want to be treated at this time. Past Medical History:  Diagnosis Date   Arthritis    knee , ankle and back   Bronchitis    Cataract    Diabetes mellitus without complication ([place]) 7/42/5956   Hyperlipidemia    Hypertension    Hypothyroidism, adult 02/10/2019   Neuralgia, post-herpetic 11/13/2016   Obesity (BMI 30.0-34.9) 02/10/2019   Thyroid disease    Vitamin D deficiency disease 02/10/2019   Past Surgical History:  Procedure Laterality Date   ABDOMINAL HYSTERECTOMY     fibroid   BREAST BIOPSY Right 07/31/2017   Procedure: EXCISIONAL BREAST BIOPSY WITH NEEDLE LOCALIZATION;  Surgeon: Virl Cagey, MD;  Location: [facility];  Service: General;  Laterality: Right;   BREAST SURGERY Right    COLONOSCOPY  09/13/2011   Procedure: COLONOSCOPY;  Surgeon: Rogene Houston, MD;  [facility];  Service: Endoscopy;  Laterality: N/A;  1030   COLONOSCOPY N/A 07/31/2018   Procedure: COLONOSCOPY;  Surgeon: Rogene Houston, MD;  [facility];  Service: Endoscopy;  Laterality: N/A;   POLYPECTOMY  07/31/2018     Procedure: POLYPECTOMY;  Surgeon: Rogene Houston, MD;  [facility];  Service: Endoscopy;;   TOTAL HIP ARTHROPLASTY Right 12/13/2017   Procedure: RIGHT TOTAL HIP ARTHROPLASTY ANTERIOR APPROACH;  Surgeon: Mcarthur Rossetti, MD;  Location: [facility];  Service: Orthopedics;  Laterality: Right;     Family History  Problem Relation Age of Onset   Stroke Mother    Diabetes Mother    Alcohol abuse Father    Early death Father    Early death Sister        pneumonia   Cancer Brother    Alzheimer's disease Sister    COPD Brother    Colon cancer Neg Hx     Social History   Social History Narrative      Comments: Divorced x 3. Single. Has 3 children (2 boys and 1 girl), 9 grandchildren, 1 great-grandchild. Disability from arthritis in back. 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      Social History   Tobacco Use   Smoking status: Former Smoker    Packs/day: 0.50    Years: 3.00    Pack years: 1.50    Types: Cigarettes    Quit date: 1981    Years since quitting: 40.6   Smokeless tobacco: Never Used  Substance Use Topics   Alcohol use: No    Current Meds  Medication Sig   Cholecalciferol (VITAMIN  D3) 125 MCG (5000 UT) CAPS Take 2 capsules by mouth daily.    hydrochlorothiazide (MICROZIDE) 12.5 MG capsule TAKE 1 CAPSULE BY MOUTH ONCE A DAY.   losartan (COZAAR) 50 MG tablet Take 1 tablet (50 mg total) by mouth daily.   Multiple Vitamin (MULTIVITAMIN WITH MINERALS) TABS tablet Take 1 tablet by mouth daily. One-A-Day   Multiple Vitamins-Minerals (HAIR SKIN AND NAILS FORMULA) TABS Take 2 tablets by mouth daily.      Depression screen [facility] 2/9 06/09/2019 04/10/2017 03/12/2017 11/13/2016 09/10/2016  Decreased Interest 0 0 0 0 0  Down, Depressed, Hopeless 0 0 0 0 0  PHQ - 2 Score 0 0 0 0 0     Objective:   Today's Vitals: BP 140/80 (BP Location: Left Arm, Patient Position: Sitting, Cuff Size:  Normal)    Pulse 87    Temp (!) 97.5 F (36.4 C) (Temporal)    Resp 18    Ht 5\' 6"  (1.676 m)    Wt 212 lb (96.2 kg)    SpO2 97%    BMI 34.22 kg/m  Vitals with BMI 01/12/2020 12/28/2019 10/27/2019  Height 5\' 6"  5\' 6"  5\' 6"   Weight 212 lbs 209 lbs 13 oz 207 lbs  BMI 34.23 36.62 94.76  Systolic 546 503 546  Diastolic 80 90 95  Pulse 87 69 81     Physical Exam  She looks systemically well.  She has gained 3 pounds since last time I saw her.  Blood pressure is somewhat acceptable.     Assessment   1. Hypothyroidism, adult   2. Diabetes mellitus without complication ([place])   3. Essential hypertension       Tests ordered No orders of the defined types were placed in this encounter.    Plan: 1. She will continue with losartan and hydrochlorthiazide for hypertension at the present time. 2. We discussed the treatment of hypothyroidism and she really does not want any treatment at the present time. 3. We discussed her diabetes and I discussed briefly people that live in the [place] and a plant-based diet.  She will try to follow this and avoid animal protein as much as possible throughout the week. 4. Follow-up in 3 months.   No orders of the defined types were placed in this encounter.   Doree Albee, MD

## 2020-03-21 ENCOUNTER — Encounter: Payer: Self-pay | Admitting: Orthopaedic Surgery

## 2020-03-21 ENCOUNTER — Ambulatory Visit: Payer: Self-pay

## 2020-03-21 ENCOUNTER — Ambulatory Visit (INDEPENDENT_AMBULATORY_CARE_PROVIDER_SITE_OTHER): Payer: Medicare Other | Admitting: Orthopaedic Surgery

## 2020-03-21 DIAGNOSIS — M25551 Pain in right hip: Secondary | ICD-10-CM | POA: Diagnosis not present

## 2020-03-21 DIAGNOSIS — Z96641 Presence of right artificial hip joint: Secondary | ICD-10-CM

## 2020-03-21 MED ORDER — METHYLPREDNISOLONE ACETATE 40 MG/ML IJ SUSP
40.0000 mg | INTRAMUSCULAR | Status: AC | PRN
  Administered 2020-03-21: 40 mg via INTRA_ARTICULAR

## 2020-03-21 MED ORDER — LIDOCAINE HCL 1 % IJ SOLN
3.0000 mL | INTRAMUSCULAR | Status: AC | PRN
  Administered 2020-03-21: 3 mL

## 2020-03-21 NOTE — Progress Notes (Signed)
Office Visit Note   Patient: Jeanette Compton           Date of Birth: 09-21-1955           MRN: [id number] Visit Date: 03/21/2020              Requested by: Doree Albee, MD [address] [place],  [place] [address] PCP: Doree Albee, MD   Assessment & Plan: Visit Diagnoses:  1. Status post total replacement of right hip   2. Pain in right hip     Plan: She shown IT band stretching exercises.  She did tolerate the injection well today states that the hip initially felt somewhat better.  We will see her back in just 2 weeks to see what type of response she had to the injection.  Questions were encouraged and answered  Follow-Up Instructions: Return in about 2 weeks (around 04/04/2020).   Orders:  Orders Placed This Encounter  Procedures  . Large Joint Inj  . XR HIP UNILAT W OR W/O PELVIS 2-3 VIEWS RIGHT   No orders of the defined types were placed in this encounter.     Procedures: Large Joint Inj: R greater trochanter on 03/21/2020 4:30 PM Indications: pain Details: 22 G 1.5 in needle, lateral approach  Arthrogram: No  Medications: 3 mL lidocaine 1 %; 40 mg methylPREDNISolone acetate 40 MG/ML Outcome: tolerated well, no immediate complications Procedure, treatment alternatives, risks and benefits explained, specific risks discussed. Consent was given by the patient. Immediately prior to procedure a time out was called to verify the correct patient, procedure, equipment, support staff and site/side marked as required. Patient was prepped and draped in the usual sterile fashion.       Clinical Data: No additional findings.   Subjective: Chief Complaint  Patient presents with  . Right Hip - Pain    HPI HPI: Mrs. Jeanette Compton comes in today for right hip pain.  Last time we saw her was 2019 at that point she is 12 weeks status post right total hip arthroplasty.  She is having lateral pain she was to follow-up with [place] in 3 months to see if she was still having  trochanteric pain which she states no one ever called her.  She is continued to have lateral hip pain that is gotten worse since past Saturday when she was coming down some steps and had a slightly caught her self.  She taken Tylenol for the pain.  She denies any numbness tingling down the leg no groin pain.  Denies any fevers or chills.  Review of Systems See HPI  Objective: Vital Signs: There were no vitals taken for this visit.  Physical Exam Constitutional:      Appearance: She is not ill-appearing or diaphoretic.  Pulmonary:     Effort: Pulmonary effort is normal.  Neurological:     Mental Status: She is oriented to person, place, and time.  Psychiatric:        Mood and Affect: Mood normal.     Ortho Exam Hips good range of motion of both hips without pain.  She has tenderness of the right trochanteric region.  She ambulates with a antalgic gait without any assistive device. Specialty Comments:  No specialty comments available.  Imaging: No results found.   PMFS History: Patient Active Problem List   Diagnosis Date Noted  . Encounter for general adult medical examination with abnormal findings 06/09/2019  . Hypothyroidism, adult 02/10/2019  . Obesity (BMI  30.0-34.9) 02/10/2019  . Vitamin D deficiency disease 02/10/2019  . Diabetes mellitus without complication ([place]) 02/77/4128  . Positive FIT (fecal immunochemical test) 07/29/2018  . Rectal bleeding 07/29/2018  . Status post total replacement of right hip 12/13/2017  . Unilateral primary osteoarthritis, right hip 09/11/2017  . Papilloma of right breast   . Neuralgia, post-herpetic 11/13/2016  . Elevated blood sugar 11/13/2016  . History of colonic polyps 10/05/2016  . HLD (hyperlipidemia) 09/10/2016  . Essential hypertension 09/10/2016  . Primary vitiligo 09/10/2016  . Osteoarthritis of knee 09/10/2016  . Chronic lower back pain 09/10/2016   Past Medical History:  Diagnosis Date  . Arthritis    knee , ankle  and back  . Bronchitis   . Cataract   . Diabetes mellitus without complication ([place]) 7/86/7672  . Hyperlipidemia   . Hypertension   . Hypothyroidism, adult 02/10/2019  . Neuralgia, post-herpetic 11/13/2016  . Obesity (BMI 30.0-34.9) 02/10/2019  . Thyroid disease   . Vitamin D deficiency disease 02/10/2019    Family History  Problem Relation Age of Onset  . Stroke Mother   . Diabetes Mother   . Alcohol abuse Father   . Early death Father   . Early death Sister        pneumonia  . Cancer Brother   . Alzheimer's disease Sister   . COPD Brother   . Colon cancer Neg Hx     Past Surgical History:  Procedure Laterality Date  . ABDOMINAL HYSTERECTOMY     fibroid  . BREAST BIOPSY Right 07/31/2017   Procedure: EXCISIONAL BREAST BIOPSY WITH NEEDLE LOCALIZATION;  Surgeon: Virl Cagey, MD;  Location: [facility];  Service: General;  Laterality: Right;  . BREAST SURGERY Right   . COLONOSCOPY  09/13/2011   Procedure: COLONOSCOPY;  Surgeon: Rogene Houston, MD;  [facility];  Service: Endoscopy;  Laterality: N/A;  1030  . COLONOSCOPY N/A 07/31/2018   Procedure: COLONOSCOPY;  Surgeon: Rogene Houston, MD;  [facility];  Service: Endoscopy;  Laterality: N/A;  . POLYPECTOMY  07/31/2018   Procedure: POLYPECTOMY;  Surgeon: Rogene Houston, MD;  [facility];  Service: Endoscopy;;  . TOTAL HIP ARTHROPLASTY Right 12/13/2017   Procedure: RIGHT TOTAL HIP ARTHROPLASTY ANTERIOR APPROACH;  Surgeon: Mcarthur Rossetti, MD;  Location: [facility];  Service: Orthopedics;  Laterality: Right;   Social History   Occupational History  . Occupation: disability  Tobacco Use  . Smoking status: Former Smoker    Packs/day: 0.50    Years: 3.00    Pack years: 1.50    Types: Cigarettes    Quit date: 1981    Years since quitting: 40.8  . Smokeless tobacco: Never Used  Vaping Use  . Vaping Use: Never used  Substance and Sexual Activity  . Alcohol use: No  . Drug use: Yes     Types: Marijuana  . Sexual activity: Never    Birth control/protection: Surgical

## 2020-04-04 ENCOUNTER — Ambulatory Visit: Payer: Medicare Other | Admitting: Orthopaedic Surgery

## 2020-04-11 ENCOUNTER — Encounter (INDEPENDENT_AMBULATORY_CARE_PROVIDER_SITE_OTHER): Payer: Self-pay | Admitting: Internal Medicine

## 2020-04-11 ENCOUNTER — Ambulatory Visit (INDEPENDENT_AMBULATORY_CARE_PROVIDER_SITE_OTHER): Payer: Medicare Other | Admitting: Internal Medicine

## 2020-04-11 ENCOUNTER — Other Ambulatory Visit: Payer: Self-pay

## 2020-04-11 VITALS — BP 148/90 | HR 63 | Temp 97.3°F | Ht 66.0 in | Wt 212.6 lb

## 2020-04-11 DIAGNOSIS — E119 Type 2 diabetes mellitus without complications: Secondary | ICD-10-CM

## 2020-04-11 DIAGNOSIS — I10 Essential (primary) hypertension: Secondary | ICD-10-CM

## 2020-04-11 DIAGNOSIS — Z23 Encounter for immunization: Secondary | ICD-10-CM

## 2020-04-11 DIAGNOSIS — E039 Hypothyroidism, unspecified: Secondary | ICD-10-CM

## 2020-04-11 MED ORDER — LEVOTHYROXINE SODIUM 50 MCG PO TABS: 50.0000 ug | ORAL_TABLET | Freq: Every day | ORAL | 3 refills | Status: DC

## 2020-04-11 NOTE — Addendum Note (Signed)
Addended by: Anibal Henderson on: 04/11/2020 04:34 PM   Modules accepted: Orders

## 2020-04-11 NOTE — Progress Notes (Signed)
Metrics: Intervention Frequency ACO  Documented Smoking Status Yearly  Screened one or more times in 24 months  Cessation Counseling or  Active cessation medication Past 24 months  Past 24 months   Guideline developer: UpToDate (See UpToDate for funding source) Date Released: 2014       Wellness Office Visit  Subjective:  Patient ID: Jeanette Compton, female    DOB: 1955-12-20  Age: 64 y.o. MRN: [id number]  CC: This lady comes in for follow-up of hypothyroidism, diabetes and hypertension. HPI  She is frustrated that she is not losing weight.  On the last visit, she clearly had biochemical hypothyroidism and did not want to go on any thyroid medications at the time.  Previously, I had put her on desiccated thyroid which was too expensive for her.  She is now willing to try some thyroid medication. Her last hemoglobin A1c was 7.5%.  She says that she eats only 1 meal a day most of the time. She continues on losartan and hydrochlorthiazide for hypertension.  Renal function was normal when it was checked back in August. Past Medical History:  Diagnosis Date  . Arthritis    knee , ankle and back  . Bronchitis   . Cataract   . Diabetes mellitus without complication ([place]) 3/47/4259  . Hyperlipidemia   . Hypertension   . Hypothyroidism, adult 02/10/2019  . Neuralgia, post-herpetic 11/13/2016  . Obesity (BMI 30.0-34.9) 02/10/2019  . Thyroid disease   . Vitamin D deficiency disease 02/10/2019   Past Surgical History:  Procedure Laterality Date  . ABDOMINAL HYSTERECTOMY     fibroid  . BREAST BIOPSY Right 07/31/2017   Procedure: EXCISIONAL BREAST BIOPSY WITH NEEDLE LOCALIZATION;  Surgeon: Virl Cagey, MD;  Location: [facility];  Service: General;  Laterality: Right;  . BREAST SURGERY Right   . COLONOSCOPY  09/13/2011   Procedure: COLONOSCOPY;  Surgeon: Rogene Houston, MD;  [facility];  Service: Endoscopy;  Laterality: N/A;  1030  . COLONOSCOPY N/A 07/31/2018   Procedure:  COLONOSCOPY;  Surgeon: Rogene Houston, MD;  [facility];  Service: Endoscopy;  Laterality: N/A;  . POLYPECTOMY  07/31/2018   Procedure: POLYPECTOMY;  Surgeon: Rogene Houston, MD;  [facility];  Service: Endoscopy;;  . TOTAL HIP ARTHROPLASTY Right 12/13/2017   Procedure: RIGHT TOTAL HIP ARTHROPLASTY ANTERIOR APPROACH;  Surgeon: Mcarthur Rossetti, MD;  [facility];  Service: Orthopedics;  Laterality: Right;     Family History  Problem Relation Age of Onset  . Stroke Mother   . Diabetes Mother   . Alcohol abuse Father   . Early death Father   . Early death Sister        pneumonia  . Cancer Brother   . Alzheimer's disease Sister   . COPD Brother   . Colon cancer Neg Hx     Social History   Social History Narrative      Comments: Divorced x 3. Single. Has 3 children (2 boys and 1 girl), 9 grandchildren, 1 great-grandchild. Disability from arthritis in back. 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      Social History   Tobacco Use  . Smoking status: Former Smoker    Packs/day: 0.50    Years: 3.00    Pack years: 1.50    Types: Cigarettes    Quit date: 1981    Years since quitting: 40.9  . Smokeless tobacco: Never  Used  Substance Use Topics  . Alcohol use: No    Current Meds  Medication Sig  . Cholecalciferol (VITAMIN D3) 125 MCG (5000 UT) CAPS Take 2 capsules by mouth daily.   . hydrochlorothiazide (MICROZIDE) 12.5 MG capsule TAKE 1 CAPSULE BY MOUTH ONCE A DAY.  Marland Kitchen losartan (COZAAR) 50 MG tablet Take 1 tablet (50 mg total) by mouth daily.  . Multiple Vitamin (MULTIVITAMIN WITH MINERALS) TABS tablet Take 1 tablet by mouth daily. One-A-Day  . Multiple Vitamins-Minerals (HAIR SKIN AND NAILS FORMULA) TABS Take 2 tablets by mouth daily.      Depression screen [facility] 2/9 04/11/2020 06/09/2019 04/10/2017 03/12/2017 11/13/2016  Decreased Interest 0 0 0 0 0  Down, Depressed, Hopeless 0 0 0 0 0    PHQ - 2 Score 0 0 0 0 0  Altered sleeping 0 - - - -  Tired, decreased energy 0 - - - -  Change in appetite 0 - - - -  Feeling bad or failure about yourself  0 - - - -  Trouble concentrating 0 - - - -  Moving slowly or fidgety/restless 0 - - - -  Suicidal thoughts 0 - - - -  PHQ-9 Score 0 - - - -  Difficult doing work/chores Not difficult at all - - - -     Objective:   Today's Vitals: BP (!) 148/90   Pulse 63   Temp (!) 97.3 F (36.3 C) (Temporal)   Ht 5\' 6"  (1.676 m)   Wt 212 lb 9.6 oz (96.4 kg)   SpO2 98%   BMI 34.31 kg/m  Vitals with BMI 04/11/2020 01/12/2020 12/28/2019  Height 5\' 6"  5\' 6"  5\' 6"   Weight 212 lbs 10 oz 212 lbs 209 lbs 13 oz  BMI 34.33 60.45 40.98  Systolic 119 147 829  Diastolic 90 80 90  Pulse 63 87 69     Physical Exam  She remains obese and has not lost any weight.  Blood pressure elevated today.  I think she is somewhat frustrated.     Assessment   1. Hypothyroidism, adult   2. Diabetes mellitus without complication ([place])   3. Essential hypertension       Tests ordered No orders of the defined types were placed in this encounter.    Plan: 1. She has agreed to treatment of hypothyroidism and we will start her with levothyroxine.  She clearly needs T3 and later on we may add Cytomel but she is somewhat wary of the cost involved so we will just start with levothyroxine for the time being. 2. She will continue with nutrition without using medications for diabetes for the time being. 3. She will continue with losartan and hydrochlorthiazide for hypertension. 4. I will see her in a couple of months time to see how she is doing and we will likely do blood work at that time.  Influenza and pneumococcal 23 vaccination were given today.   Meds ordered this encounter  Medications  . levothyroxine (SYNTHROID) 50 MCG tablet    Sig: Take 1 tablet (50 mcg total) by mouth daily.    Dispense:  30 tablet    Refill:  3    Merary Garguilo Luther Parody, MD

## 2020-04-12 ENCOUNTER — Other Ambulatory Visit (INDEPENDENT_AMBULATORY_CARE_PROVIDER_SITE_OTHER): Payer: Self-pay | Admitting: Internal Medicine

## 2020-04-12 ENCOUNTER — Other Ambulatory Visit (INDEPENDENT_AMBULATORY_CARE_PROVIDER_SITE_OTHER): Payer: Self-pay

## 2020-04-12 MED ORDER — LOSARTAN POTASSIUM 50 MG PO TABS
50.0000 mg | ORAL_TABLET | Freq: Every day | ORAL | 0 refills | Status: DC
Start: 2020-04-12 — End: 2020-11-07

## 2020-04-12 MED ORDER — HYDROCHLOROTHIAZIDE 12.5 MG PO CAPS
12.5000 mg | ORAL_CAPSULE | Freq: Every day | ORAL | 0 refills | Status: DC
Start: 2020-04-12 — End: 2020-11-17

## 2020-04-13 ENCOUNTER — Ambulatory Visit: Payer: Medicare Other | Admitting: Orthopaedic Surgery

## 2020-04-27 ENCOUNTER — Ambulatory Visit (INDEPENDENT_AMBULATORY_CARE_PROVIDER_SITE_OTHER): Payer: Medicare Other | Admitting: Orthopaedic Surgery

## 2020-04-27 ENCOUNTER — Encounter: Payer: Self-pay | Admitting: Orthopaedic Surgery

## 2020-04-27 DIAGNOSIS — Z96641 Presence of right artificial hip joint: Secondary | ICD-10-CM

## 2020-04-27 MED ORDER — ACETAMINOPHEN-CODEINE #3 300-30 MG PO TABS
1.0000 | ORAL_TABLET | Freq: Three times a day (TID) | ORAL | 0 refills | Status: DC | PRN
Start: 2020-04-27 — End: 2020-05-16

## 2020-04-27 MED ORDER — NABUMETONE 750 MG PO TABS: 750.0000 mg | ORAL_TABLET | Freq: Two times a day (BID) | ORAL | 1 refills | Status: DC | PRN

## 2020-04-27 NOTE — Progress Notes (Signed)
The patient is now 5 months status post a right total hip arthroplasty.  She is still been dealing with the pain from surgery itself.  She ambulates with a cane.  She is only 64 years old.  She is moderately obese.  At the last visit we did provide a steroid injection of the trochanteric area and showed her some stretching.  She was someone with a significantly deformed hip and walked quite irregular before this due to her leg length discrepancy.  We have her back more balanced I think her body is just needing to adjust.  X-rays just last month of her right hip shows a well-seated implant with no complicating features.  She does have better days these days and it does not hurt every single day.  On exam I was able to easily put her right operative hip through internal ex rotation and this did not appear to be uncomfortable to her.  I would like to give this more time.  I will try some Tylenol No. 3 to use sparingly and some Relafen.  I would like to see her back in 3 months at that visit we can have a standing low AP pelvis and lateral of her right operative hip.

## 2020-05-03 ENCOUNTER — Other Ambulatory Visit (HOSPITAL_COMMUNITY): Payer: Self-pay | Admitting: Internal Medicine

## 2020-05-03 DIAGNOSIS — Z1231 Encounter for screening mammogram for malignant neoplasm of breast: Secondary | ICD-10-CM

## 2020-05-16 ENCOUNTER — Other Ambulatory Visit: Payer: Self-pay | Admitting: Orthopaedic Surgery

## 2020-05-16 ENCOUNTER — Telehealth: Payer: Self-pay | Admitting: Orthopaedic Surgery

## 2020-05-16 MED ORDER — ACETAMINOPHEN-CODEINE #3 300-30 MG PO TABS
1.0000 | ORAL_TABLET | Freq: Three times a day (TID) | ORAL | 0 refills | Status: DC | PRN
Start: 2020-05-16 — End: 2020-06-08

## 2020-05-16 NOTE — Telephone Encounter (Signed)
Pt called and would like a refill on her acetaminophen-codeine. In pain

## 2020-05-16 NOTE — Telephone Encounter (Signed)
Please advise 

## 2020-05-31 ENCOUNTER — Telehealth (INDEPENDENT_AMBULATORY_CARE_PROVIDER_SITE_OTHER): Payer: Self-pay

## 2020-05-31 NOTE — Telephone Encounter (Signed)
She is on schedule 1 thing in morning. She said she will be up to answer call.

## 2020-05-31 NOTE — Telephone Encounter (Signed)
I would recommend that she first get a COVID test immediately.  If she wants to do a virtual visit, we can do 1 tomorrow I suppose.

## 2020-06-01 ENCOUNTER — Telehealth (INDEPENDENT_AMBULATORY_CARE_PROVIDER_SITE_OTHER): Payer: Medicare Other | Admitting: Internal Medicine

## 2020-06-01 ENCOUNTER — Encounter (INDEPENDENT_AMBULATORY_CARE_PROVIDER_SITE_OTHER): Payer: Self-pay | Admitting: Internal Medicine

## 2020-06-01 VITALS — BP 141/76 | HR 70 | Ht 66.0 in

## 2020-06-01 DIAGNOSIS — R059 Cough, unspecified: Secondary | ICD-10-CM | POA: Diagnosis not present

## 2020-06-01 DIAGNOSIS — R509 Fever, unspecified: Secondary | ICD-10-CM | POA: Diagnosis not present

## 2020-06-01 NOTE — Progress Notes (Signed)
Metrics: Intervention Frequency ACO  Documented Smoking Status Yearly  Screened one or more times in 24 months  Cessation Counseling or  Active cessation medication Past 24 months  Past 24 months   Guideline developer: UpToDate (See UpToDate for funding source) Date Released: 2014       Wellness Office Visit  Subjective:  Patient ID: Jeanette Compton, female    DOB: Oct 18, 1955  Age: 65 y.o. MRN: [id number]  CC: This lady was exposed to COVID-19 disease 4 days ago.  She has symptoms of low-grade fevers, cough and generally feeling unwell.  She denies any dyspnea. HPI  She is fully vaccinated but has not received her booster. Past Medical History:  Diagnosis Date  . Arthritis    knee , ankle and back  . Bronchitis   . Cataract   . Diabetes mellitus without complication ([place]) 3/47/4259  . Hyperlipidemia   . Hypertension   . Hypothyroidism, adult 02/10/2019  . Neuralgia, post-herpetic 11/13/2016  . Obesity (BMI 30.0-34.9) 02/10/2019  . Thyroid disease   . Vitamin D deficiency disease 02/10/2019   Past Surgical History:  Procedure Laterality Date  . ABDOMINAL HYSTERECTOMY     fibroid  . BREAST BIOPSY Right 07/31/2017   Procedure: EXCISIONAL BREAST BIOPSY WITH NEEDLE LOCALIZATION;  Surgeon: Virl Cagey, MD;  Location: [facility];  Service: General;  Laterality: Right;  . BREAST SURGERY Right   . COLONOSCOPY  09/13/2011   Procedure: COLONOSCOPY;  Surgeon: Rogene Houston, MD;  [facility];  Service: Endoscopy;  Laterality: N/A;  1030  . COLONOSCOPY N/A 07/31/2018   Procedure: COLONOSCOPY;  Surgeon: Rogene Houston, MD;  [facility];  Service: Endoscopy;  Laterality: N/A;  . POLYPECTOMY  07/31/2018   Procedure: POLYPECTOMY;  Surgeon: Rogene Houston, MD;  [facility];  Service: Endoscopy;;  . TOTAL HIP ARTHROPLASTY Right 12/13/2017   Procedure: RIGHT TOTAL HIP ARTHROPLASTY ANTERIOR APPROACH;  Surgeon: Mcarthur Rossetti, MD;  [facility];   Service: Orthopedics;  Laterality: Right;     Family History  Problem Relation Age of Onset  . Stroke Mother   . Diabetes Mother   . Alcohol abuse Father   . Early death Father   . Early death Sister        pneumonia  . Cancer Brother   . Alzheimer's disease Sister   . COPD Brother   . Colon cancer Neg Hx     Social History   Social History Narrative      Comments: Divorced x 3. Single. Has 3 children (2 boys and 1 girl), 9 grandchildren, 1 great-grandchild. Disability from arthritis in back. 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      Social History   Tobacco Use  . Smoking status: Former Smoker    Packs/day: 0.50    Years: 3.00    Pack years: 1.50    Types: Cigarettes    Quit date: 1981    Years since quitting: 41.0  . Smokeless tobacco: Never Used  Substance Use Topics  . Alcohol use: No    Current Meds  Medication Sig  . acetaminophen-codeine (TYLENOL #3) 300-30 MG tablet Take 1-2 tablets by mouth every 8 (eight) hours as needed.  . Cholecalciferol (VITAMIN D3) 125 MCG (5000 UT) CAPS Take 2 capsules by mouth daily.   . hydrochlorothiazide (MICROZIDE) 12.5 MG capsule Take 1 capsule (12.5 mg total) by mouth daily.  Marland Kitchen  levothyroxine (SYNTHROID) 50 MCG tablet Take 1 tablet (50 mcg total) by mouth daily.  Marland Kitchen losartan (COZAAR) 50 MG tablet Take 1 tablet (50 mg total) by mouth daily.  . Multiple Vitamin (MULTIVITAMIN WITH MINERALS) TABS tablet Take 1 tablet by mouth daily. One-A-Day  . Multiple Vitamins-Minerals (HAIR SKIN AND NAILS FORMULA) TABS Take 2 tablets by mouth daily.  . nabumetone (RELAFEN) 750 MG tablet Take 1 tablet (750 mg total) by mouth 2 (two) times daily as needed.      Depression screen [facility] 2/9 04/11/2020 06/09/2019 04/10/2017 03/12/2017 11/13/2016  Decreased Interest 0 0 0 0 0  Down, Depressed, Hopeless 0 0 0 0 0  PHQ - 2 Score 0 0 0 0 0  Altered sleeping 0 - - - -  Tired, decreased energy 0 -  - - -  Change in appetite 0 - - - -  Feeling bad or failure about yourself  0 - - - -  Trouble concentrating 0 - - - -  Moving slowly or fidgety/restless 0 - - - -  Suicidal thoughts 0 - - - -  PHQ-9 Score 0 - - - -  Difficult doing work/chores Not difficult at all - - - -     Objective:   Today's Vitals: BP (!) 141/76   Pulse 70   Ht 5\' 6"  (1.676 m)   BMI 34.31 kg/m  Vitals with BMI 06/01/2020 04/11/2020 01/12/2020  Height 5\' 6"  5\' 6"  5\' 6"   Weight (No Data) 212 lbs 10 oz 212 lbs  BMI - 65.99 35.70  Systolic 177 939 030  Diastolic 76 90 80  Pulse 70 63 87     Physical Exam   She appears to be alert and orientated and does not appear to be dyspneic on the phone.    Assessment   1. Cough   2. Fever, unspecified fever cause       Tests ordered No orders of the defined types were placed in this encounter.    Plan: 1. She may well have COVID-19 disease.  I recommended that she does get a test before we move further and I have given her relevant numbers for this. 2. This phone call lasted 5 minutes and 15 seconds.   No orders of the defined types were placed in this encounter.   Doree Albee, MD

## 2020-06-03 ENCOUNTER — Other Ambulatory Visit: Payer: Medicare Other

## 2020-06-03 DIAGNOSIS — Z20822 Contact with and (suspected) exposure to covid-19: Secondary | ICD-10-CM

## 2020-06-05 LAB — SARS-COV-2, NAA 2 DAY TAT

## 2020-06-05 LAB — NOVEL CORONAVIRUS, NAA: SARS-CoV-2, NAA: DETECTED — AB

## 2020-06-06 ENCOUNTER — Telehealth: Payer: Self-pay

## 2020-06-06 NOTE — Telephone Encounter (Signed)
Called to discuss with patient about COVID-19 symptoms and the use of one of the available treatments for those with mild to moderate Covid symptoms and at a high risk of hospitalization.  Pt appears to qualify for outpatient treatment due to co-morbid conditions and/or a member of an at-risk group in accordance with the FDA Emergency Use Authorization.    Symptom onset: Unknown Vaccinated: Yes Booster? Unknown Immunocompromised? No Qualifiers: HTN, BMI >25  Unable to reach pt - Unable to come to phone.   Jeanette Compton

## 2020-06-06 NOTE — Telephone Encounter (Signed)
Pt. Returned call and states she is better,dies not wish to discuss any further treatments.

## 2020-06-08 ENCOUNTER — Telehealth (INDEPENDENT_AMBULATORY_CARE_PROVIDER_SITE_OTHER): Payer: Self-pay

## 2020-06-08 ENCOUNTER — Telehealth: Payer: Self-pay | Admitting: Orthopaedic Surgery

## 2020-06-08 MED ORDER — ACETAMINOPHEN-CODEINE #3 300-30 MG PO TABS
1.0000 | ORAL_TABLET | Freq: Three times a day (TID) | ORAL | 0 refills | Status: DC | PRN
Start: 2020-06-08 — End: 2020-10-28

## 2020-06-08 NOTE — Telephone Encounter (Signed)
Patient called today to let us know that her test came back Positive for Covid. Patient stated that she is doing well and will call if she has any changes.

## 2020-06-08 NOTE — Telephone Encounter (Signed)
Please advise 

## 2020-06-08 NOTE — Telephone Encounter (Signed)
Patient called needing Rx refilled Tylenol 3. The number to contact patient is [phone].

## 2020-06-13 ENCOUNTER — Ambulatory Visit (HOSPITAL_COMMUNITY): Payer: Medicare Other

## 2020-06-22 ENCOUNTER — Other Ambulatory Visit: Payer: Self-pay

## 2020-06-22 ENCOUNTER — Encounter (INDEPENDENT_AMBULATORY_CARE_PROVIDER_SITE_OTHER): Payer: Self-pay | Admitting: Internal Medicine

## 2020-06-22 ENCOUNTER — Ambulatory Visit (INDEPENDENT_AMBULATORY_CARE_PROVIDER_SITE_OTHER): Payer: Medicare Other | Admitting: Internal Medicine

## 2020-06-22 VITALS — BP 132/86 | HR 73 | Temp 98.1°F | Ht 66.0 in | Wt 210.2 lb

## 2020-06-22 DIAGNOSIS — E119 Type 2 diabetes mellitus without complications: Secondary | ICD-10-CM

## 2020-06-22 DIAGNOSIS — I10 Essential (primary) hypertension: Secondary | ICD-10-CM

## 2020-06-22 DIAGNOSIS — G4700 Insomnia, unspecified: Secondary | ICD-10-CM

## 2020-06-22 DIAGNOSIS — E669 Obesity, unspecified: Secondary | ICD-10-CM

## 2020-06-22 DIAGNOSIS — E039 Hypothyroidism, unspecified: Secondary | ICD-10-CM

## 2020-06-22 NOTE — Progress Notes (Signed)
Metrics: Intervention Frequency ACO  Documented Smoking Status Yearly  Screened one or more times in 24 months  Cessation Counseling or  Active cessation medication Past 24 months  Past 24 months   Guideline developer: UpToDate (See UpToDate for funding source) Date Released: 2014       Wellness Office Visit  Subjective:  Patient ID: Jeanette Compton, female    DOB: 04/17/1956  Age: 65 y.o. MRN: [id number]  CC: This lady comes in for follow-up of hypothyroidism, diabetes, hypertension and obesity. HPI  She tries fairly hard to eat healthier.  She has tolerated levothyroxine and she has couple of tablets left and she needs a refill.  She denies feeling any different with levothyroxine. Her diabetes is largely diet controlled and we will try to avoid medications if possible. She continues with hydrochlorothiazide and losartan for hypertension.  She denies any chest pain, dyspnea, palpitations or limb weakness. She describes significant insomnia and she has tried melatonin from pharmacies over-the-counter without benefit. Past Medical History:  Diagnosis Date  . Arthritis    knee , ankle and back  . Bronchitis   . Cataract   . Diabetes mellitus without complication ([place]) 6/60/6301  . Hyperlipidemia   . Hypertension   . Hypothyroidism, adult 02/10/2019  . Neuralgia, post-herpetic 11/13/2016  . Obesity (BMI 30.0-34.9) 02/10/2019  . Thyroid disease   . Vitamin D deficiency disease 02/10/2019   Past Surgical History:  Procedure Laterality Date  . ABDOMINAL HYSTERECTOMY     fibroid  . BREAST BIOPSY Right 07/31/2017   Procedure: EXCISIONAL BREAST BIOPSY WITH NEEDLE LOCALIZATION;  Surgeon: Virl Cagey, MD;  Location: [facility];  Service: General;  Laterality: Right;  . BREAST SURGERY Right   . COLONOSCOPY  09/13/2011   Procedure: COLONOSCOPY;  Surgeon: Rogene Houston, MD;  Location[facility];  Service: Endoscopy;  Laterality: N/A;  1030  . COLONOSCOPY N/A 07/31/2018   Procedure:  COLONOSCOPY;  Surgeon: Rogene Houston, MD;  [facility];  Service: Endoscopy;  Laterality: N/A;  . POLYPECTOMY  07/31/2018   Procedure: POLYPECTOMY;  Surgeon: Rogene Houston, MD;  [facility];  Service: Endoscopy;;  . TOTAL HIP ARTHROPLASTY Right 12/13/2017   Procedure: RIGHT TOTAL HIP ARTHROPLASTY ANTERIOR APPROACH;  Surgeon: Mcarthur Rossetti, MD;  Location: [facility];  Service: Orthopedics;  Laterality: Right;     Family History  Problem Relation Age of Onset  . Stroke Mother   . Diabetes Mother   . Alcohol abuse Father   . Early death Father   . Early death Sister        pneumonia  . Cancer Brother   . Alzheimer's disease Sister   . COPD Brother   . Colon cancer Neg Hx     Social History   Social History Narrative      Comments: Divorced x 3. Single. Has 3 children (2 boys and 1 girl), 9 grandchildren, 1 great-grandchild. Disability from arthritis in back. 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      Social History   Tobacco Use  . Smoking status: Former Smoker    Packs/day: 0.50    Years: 3.00    Pack years: 1.50    Types: Cigarettes    Quit date: 1981    Years since quitting: 41.1  . Smokeless tobacco: Never Used  Substance Use Topics  . Alcohol use: No    Current Meds  Medication  Sig  . acetaminophen-codeine (TYLENOL #3) 300-30 MG tablet Take 1-2 tablets by mouth every 8 (eight) hours as needed.  . Cholecalciferol (VITAMIN D3) 125 MCG (5000 UT) CAPS Take 2 capsules by mouth daily.   . hydrochlorothiazide (MICROZIDE) 12.5 MG capsule Take 1 capsule (12.5 mg total) by mouth daily.  Marland Kitchen levothyroxine (SYNTHROID) 50 MCG tablet Take 1 tablet (50 mcg total) by mouth daily.  Marland Kitchen losartan (COZAAR) 50 MG tablet Take 1 tablet (50 mg total) by mouth daily.  . Multiple Vitamin (MULTIVITAMIN WITH MINERALS) TABS tablet Take 1 tablet by mouth daily. One-A-Day  . Multiple Vitamins-Minerals (HAIR SKIN  AND NAILS FORMULA) TABS Take 2 tablets by mouth daily.  . nabumetone (RELAFEN) 750 MG tablet Take 1 tablet (750 mg total) by mouth 2 (two) times daily as needed.      Depression screen [facility] 2/9 06/22/2020 04/11/2020 06/09/2019 04/10/2017 03/12/2017  Decreased Interest 0 0 0 0 0  Down, Depressed, Hopeless 0 0 0 0 0  PHQ - 2 Score 0 0 0 0 0  Altered sleeping 0 0 - - -  Tired, decreased energy 0 0 - - -  Change in appetite 0 0 - - -  Feeling bad or failure about yourself  0 0 - - -  Trouble concentrating 0 0 - - -  Moving slowly or fidgety/restless 0 0 - - -  Suicidal thoughts 0 0 - - -  PHQ-9 Score 0 0 - - -  Difficult doing work/chores Not difficult at all Not difficult at all - - -     Objective:   Today's Vitals: BP 132/86   Pulse 73   Temp 98.1 F (36.7 C) (Temporal)   Ht 5\' 6"  (1.676 m)   Wt 210 lb 3.2 oz (95.3 kg)   SpO2 98%   BMI 33.93 kg/m  Vitals with BMI 06/22/2020 06/01/2020 04/11/2020  Height 5\' 6"  5\' 6"  5\' 6"   Weight 210 lbs 3 oz (No Data) 212 lbs 10 oz  BMI 38.10 - 17.51  Systolic 025 852 778  Diastolic 86 76 90  Pulse 73 70 63     Physical Exam   She looks systemically well.  She remains obese but has lost a few pounds in weight.  Blood pressure is reasonable.    Assessment   1. Hypothyroidism, adult   2. Diabetes mellitus without complication ([place])   3. Essential hypertension   4. Obesity (BMI 30.0-34.9)   5. Insomnia, unspecified type       Tests ordered Orders Placed This Encounter  Procedures  . COMPLETE METABOLIC PANEL WITH GFR  . Hemoglobin A1c  . T3, free  . TSH  . T4, free     Plan: 1. She will continue with levothyroxine but I suspect we will need to adjust the dose and I would probably add Cytomel to make sure she gets T3. 2. I will check an A1c to see how her diabetes is doing now. 3. She will continue with antihypertensive medication and we will check renal function. 4. As far as insomnia is concerned I did recommend that  she get melatonin from [email or web address]. 5. Follow-up in 3 months   No orders of the defined types were placed in this encounter.   Doree Albee, MD

## 2020-06-23 ENCOUNTER — Other Ambulatory Visit (INDEPENDENT_AMBULATORY_CARE_PROVIDER_SITE_OTHER): Payer: Self-pay | Admitting: Internal Medicine

## 2020-06-23 LAB — HEMOGLOBIN A1C
Hgb A1c MFr Bld: 7.3 % of total Hgb — ABNORMAL HIGH (ref <5.7)
Mean Plasma Glucose: 163 mg/dL
eAG (mmol/L): 9 mmol/L

## 2020-06-23 LAB — COMPLETE METABOLIC PANEL WITH GFR
AG Ratio: 1.2 (calc) (ref 1.0–2.5)
ALT: 13 U/L (ref 6–29)
AST: 13 U/L (ref 10–35)
Albumin: 4.2 g/dL (ref 3.6–5.1)
Alkaline phosphatase (APISO): 91 U/L (ref 37–153)
BUN: 14 mg/dL (ref 7–25)
CO2: 31 mmol/L (ref 20–32)
Calcium: 9.9 mg/dL (ref 8.6–10.4)
Chloride: 100 mmol/L (ref 98–110)
Creat: 0.72 mg/dL (ref 0.50–0.99)
GFR, Est African American: 103 mL/min/{1.73_m2} (ref >=60)
GFR, Est Non African American: 89 mL/min/{1.73_m2} (ref >=60)
Globulin: 3.5 g/dL (calc) (ref 1.9–3.7)
Glucose, Bld: 89 mg/dL (ref 65–139)
Potassium: 4.6 mmol/L (ref 3.5–5.3)
Sodium: 139 mmol/L (ref 135–146)
Total Bilirubin: 0.3 mg/dL (ref 0.2–1.2)
Total Protein: 7.7 g/dL (ref 6.1–8.1)

## 2020-06-23 LAB — T3, FREE: T3, Free: 2.3 pg/mL (ref 2.3–4.2)

## 2020-06-23 LAB — T4, FREE: Free T4: 1 ng/dL (ref 0.8–1.8)

## 2020-06-23 LAB — TSH: TSH: 2.52 mIU/L (ref 0.40–4.50)

## 2020-06-23 MED ORDER — LEVOTHYROXINE SODIUM 75 MCG PO TABS: 75.0000 ug | ORAL_TABLET | Freq: Every day | ORAL | 3 refills | Status: DC

## 2020-06-23 MED ORDER — LIOTHYRONINE SODIUM 5 MCG PO TABS: 5.0000 ug | ORAL_TABLET | Freq: Every day | ORAL | 3 refills | Status: DC

## 2020-06-23 NOTE — Progress Notes (Signed)
Pt called and given lab results & instructions. Pt will start new dose of the Levothyroxine 75 mcg/ daily. Also take with New RX of Cytomel 5 mcg/ daily with Thyroid dose. To pick them up at [facility]. Pt understand instructions.

## 2020-06-23 NOTE — Progress Notes (Signed)
Please call this patient and let her know that her diabetes is slightly better.  However, her thyroid function is still not optimal.  I have sent a new prescription of a higher dose of levothyroxine 75 mcg daily and also I am going to add another medication called Cytomel 5 mcg daily.  She can take both of these in the morning together.  I have sent both these prescriptions to [facility]. Follow-up as scheduled.

## 2020-06-27 ENCOUNTER — Ambulatory Visit (HOSPITAL_COMMUNITY): Payer: Medicare Other

## 2020-06-29 ENCOUNTER — Other Ambulatory Visit: Payer: Self-pay | Admitting: Orthopaedic Surgery

## 2020-07-11 ENCOUNTER — Other Ambulatory Visit: Payer: Self-pay

## 2020-07-11 ENCOUNTER — Ambulatory Visit (HOSPITAL_COMMUNITY)
Admission: RE | Admit: 2020-07-11 | Discharge: 2020-07-11 | Disposition: A | Discharge status: Home / Self Care | Payer: Medicare Other | Source: Ambulatory Visit | Attending: Internal Medicine | Admitting: Internal Medicine

## 2020-07-11 DIAGNOSIS — Z1231 Encounter for screening mammogram for malignant neoplasm of breast: Secondary | ICD-10-CM | POA: Insufficient documentation

## 2020-07-18 ENCOUNTER — Ambulatory Visit (INDEPENDENT_AMBULATORY_CARE_PROVIDER_SITE_OTHER): Payer: Medicare Other | Admitting: Physician Assistant

## 2020-07-18 ENCOUNTER — Ambulatory Visit: Payer: Self-pay

## 2020-07-18 ENCOUNTER — Ambulatory Visit (HOSPITAL_COMMUNITY): Payer: Medicare Other

## 2020-07-18 ENCOUNTER — Encounter: Payer: Self-pay | Admitting: Physician Assistant

## 2020-07-18 VITALS — Ht 66.0 in | Wt 210.0 lb

## 2020-07-18 DIAGNOSIS — M79605 Pain in left leg: Secondary | ICD-10-CM | POA: Diagnosis not present

## 2020-07-18 DIAGNOSIS — M79604 Pain in right leg: Secondary | ICD-10-CM | POA: Diagnosis not present

## 2020-07-18 DIAGNOSIS — Z96641 Presence of right artificial hip joint: Secondary | ICD-10-CM | POA: Diagnosis not present

## 2020-07-18 NOTE — Progress Notes (Signed)
Office Visit Note   Patient: Jeanette Compton           Date of Birth: 1955-07-11           MRN: [id number] Visit Date: 07/18/2020              Requested by: Doree Albee, MD [address] [place],  [place] [address] PCP: Doree Albee, MD   Assessment & Plan: Visit Diagnoses:  1. Status post total replacement of right hip   2. Pain in right leg     Plan: She shown IT band and piriformis stretching exercises.  We will obtain an MRI of her L-spine to rule out HNP as the source of her right leg radicular pain.  Follow-up after the MRI to go over results and discuss further treatment.  Follow-Up Instructions: Return after MRi.   Orders:  Orders Placed This Encounter  Procedures  . XR HIP UNILAT W OR W/O PELVIS 2-3 VIEWS RIGHT  . XR Lumbar Spine 2-3 Views   No orders of the defined types were placed in this encounter.     Procedures: No procedures performed   Clinical Data: No additional findings.   Subjective: Chief Complaint  Patient presents with  . Right Hip - Pain    HPI Mrs. Ayars returns now 8 months status post right total hip arthroplasty.  She states she is having pain that radiates from the right hip down to her ankle at times.  Some days are better than others.  She is taking Tylenol 3 with no relief she states taking regular Tylenol every 4 hours and that helps some.  Low back pain present awakens her at night.  No saddle anesthesia like symptoms bowel or bladder dysfunction. Review of Systems Denies any recent fevers.  Chills.  Please see HPI otherwise negative  Objective: Vital Signs: Ht 5\' 6"  (1.676 m)   Wt 210 lb (95.3 kg)   BMI 33.89 kg/m   Physical Exam General: Well-developed well-nourished female no acute distress. Psych: Alert and oriented x3 Ortho Exam Lower extremities 5 out of 5 strength throughout against resistance except for right hip flexion which is 4-5 strength.  Positive straight leg raise on the right negative on the  left.  Tenderness right lower lumbar paraspinous region.  She has full forward flexion extension lumbar spine without significant pain.  Left hip excellent range of motion without pain.  Right hip has some discomfort with internal rotation but otherwise good range of motion. Specialty Comments:  No specialty comments available.  Imaging: XR HIP UNILAT W OR W/O PELVIS 2-3 VIEWS RIGHT  Result Date: 07/18/2020 AP pelvis lateral view right hip: Bilateral hips well located.  Status post right total hip arthroplasty with well-seated components.  No hardware failure.  No acute fractures bony abnormalities.  XR Lumbar Spine 2-3 Views  Result Date: 07/18/2020 Lumbar spine 2 views: No acute fractures.  Normal lordotic curvature.  Lower lumbar facet degenerative changes otherwise displays well-maintained.  No spondylolisthesis    PMFS History: Patient Active Problem List   Diagnosis Date Noted  . Encounter for general adult medical examination with abnormal findings 06/09/2019  . Hypothyroidism, adult 02/10/2019  . Obesity (BMI 30.0-34.9) 02/10/2019  . Vitamin D deficiency disease 02/10/2019  . Diabetes mellitus without complication ([place]) 54/00/8676  . Positive FIT (fecal immunochemical test) 07/29/2018  . Rectal bleeding 07/29/2018  . Status post total replacement of right hip 12/13/2017  . Unilateral primary osteoarthritis, right hip 09/11/2017  .  Papilloma of right breast   . Neuralgia, post-herpetic 11/13/2016  . Elevated blood sugar 11/13/2016  . History of colonic polyps 10/05/2016  . HLD (hyperlipidemia) 09/10/2016  . Essential hypertension 09/10/2016  . Primary vitiligo 09/10/2016  . Osteoarthritis of knee 09/10/2016  . Chronic lower back pain 09/10/2016   Past Medical History:  Diagnosis Date  . Arthritis    knee , ankle and back  . Bronchitis   . Cataract   . Diabetes mellitus without complication ([place]) 01/16/9370  . Hyperlipidemia   . Hypertension   . Hypothyroidism,  adult 02/10/2019  . Neuralgia, post-herpetic 11/13/2016  . Obesity (BMI 30.0-34.9) 02/10/2019  . Thyroid disease   . Vitamin D deficiency disease 02/10/2019    Family History  Problem Relation Age of Onset  . Stroke Mother   . Diabetes Mother   . Alcohol abuse Father   . Early death Father   . Early death Sister        pneumonia  . Cancer Brother   . Alzheimer's disease Sister   . COPD Brother   . Colon cancer Neg Hx     Past Surgical History:  Procedure Laterality Date  . ABDOMINAL HYSTERECTOMY     fibroid  . BREAST BIOPSY Right 07/31/2017   Procedure: EXCISIONAL BREAST BIOPSY WITH NEEDLE LOCALIZATION;  Surgeon: Virl Cagey, MD;  Location: [facility];  Service: General;  Laterality: Right;  . BREAST SURGERY Right   . COLONOSCOPY  09/13/2011   Procedure: COLONOSCOPY;  Surgeon: Rogene Houston, MD;  [facility];  Service: Endoscopy;  Laterality: N/A;  1030  . COLONOSCOPY N/A 07/31/2018   Procedure: COLONOSCOPY;  Surgeon: Rogene Houston, MD;  [facility];  Service: Endoscopy;  Laterality: N/A;  . POLYPECTOMY  07/31/2018   Procedure: POLYPECTOMY;  Surgeon: Rogene Houston, MD;  [facility];  Service: Endoscopy;;  . TOTAL HIP ARTHROPLASTY Right 12/13/2017   Procedure: RIGHT TOTAL HIP ARTHROPLASTY ANTERIOR APPROACH;  Surgeon: Mcarthur Rossetti, MD;  Location: [facility];  Service: Orthopedics;  Laterality: Right;   Social History   Occupational History  . Occupation: disability  Tobacco Use  . Smoking status: Former Smoker    Packs/day: 0.50    Years: 3.00    Pack years: 1.50    Types: Cigarettes    Quit date: 1981    Years since quitting: 41.1  . Smokeless tobacco: Never Used  Vaping Use  . Vaping Use: Never used  Substance and Sexual Activity  . Alcohol use: No  . Drug use: Yes    Types: Marijuana  . Sexual activity: Never    Birth control/protection: Surgical

## 2020-07-18 NOTE — Addendum Note (Signed)
Addended by: Meyer Cory on: 07/18/2020 04:30 PM   Modules accepted: Orders

## 2020-07-27 ENCOUNTER — Ambulatory Visit: Payer: Medicare Other | Admitting: Orthopaedic Surgery

## 2020-08-01 ENCOUNTER — Other Ambulatory Visit: Payer: Self-pay

## 2020-08-01 ENCOUNTER — Ambulatory Visit (HOSPITAL_COMMUNITY)
Admission: RE | Admit: 2020-08-01 | Discharge: 2020-08-01 | Disposition: A | Discharge status: Home / Self Care | Payer: Medicare Other | Source: Ambulatory Visit | Attending: Internal Medicine | Admitting: Internal Medicine

## 2020-08-01 DIAGNOSIS — Z1231 Encounter for screening mammogram for malignant neoplasm of breast: Secondary | ICD-10-CM | POA: Diagnosis not present

## 2020-08-03 ENCOUNTER — Ambulatory Visit (HOSPITAL_COMMUNITY)
Admission: RE | Admit: 2020-08-03 | Discharge: 2020-08-03 | Disposition: A | Discharge status: Home / Self Care | Payer: Medicare Other | Source: Ambulatory Visit | Attending: Physician Assistant | Admitting: Physician Assistant

## 2020-08-03 ENCOUNTER — Other Ambulatory Visit: Payer: Self-pay

## 2020-08-03 DIAGNOSIS — M79604 Pain in right leg: Secondary | ICD-10-CM | POA: Diagnosis not present

## 2020-08-03 DIAGNOSIS — M545 Low back pain, unspecified: Secondary | ICD-10-CM | POA: Diagnosis not present

## 2020-08-18 ENCOUNTER — Other Ambulatory Visit: Payer: Self-pay

## 2020-08-18 ENCOUNTER — Ambulatory Visit (INDEPENDENT_AMBULATORY_CARE_PROVIDER_SITE_OTHER): Payer: Medicare Other | Admitting: Orthopaedic Surgery

## 2020-08-18 ENCOUNTER — Encounter: Payer: Self-pay | Admitting: Orthopaedic Surgery

## 2020-08-18 DIAGNOSIS — Z96641 Presence of right artificial hip joint: Secondary | ICD-10-CM | POA: Diagnosis not present

## 2020-08-18 DIAGNOSIS — M5441 Lumbago with sciatica, right side: Secondary | ICD-10-CM

## 2020-08-18 NOTE — Progress Notes (Signed)
The patient comes in for follow-up after having a lumbar spine MRI due to pain in her right leg.  She does have a history of right total hip arthroplasty done in 2019 but she has been having pain in her low back for 15 to 20 years and is to be getting worse over 3 years especially since her hip replacement.  She still has some thigh pain and pain in the groin on the right side as well.  She was still complaining of pain going all the way down her foot with numbness and tingling so at this point a MRI is warranted of her lumbar spine.  On my exam she still does have some guarding with range of motion of the right hip.  She reports pain in her shin on the right side and numbness and tingling as well.  The MRI of her lumbar spine is reviewed with her.  I gave her a copy of the report.  I showed her a spine model as well to explain from a back standpoint when she may hurt in her back and why she may also have symptoms radiating down her leg.  The MRI of her lumbar spine does show at L4-L5 a disc protrusion that is subarticular and foraminal to the right side and she has moderate facet arthropathy on that side as well.  That could be irritating the nerve at that level causing some of the radicular symptoms.  At L5-S1 there is also a grade 1 in the thesis and she has moderate to severe facet arthropathy at that level.  I would like to send her to Dr. Ernestina Patches for a right-sided L4-L5 epidural steroid injection due to the foraminal disc protrusion at that level.  She may end up benefiting from facet joint injections at some point.  Once he is able to inject her spine I can certainly see her back in follow-up several weeks later.

## 2020-08-19 ENCOUNTER — Telehealth: Payer: Self-pay | Admitting: Orthopaedic Surgery

## 2020-08-19 MED ORDER — TIZANIDINE HCL 4 MG PO TABS: 4.0000 mg | ORAL_TABLET | Freq: Three times a day (TID) | ORAL | 1 refills | Status: DC | PRN

## 2020-08-19 NOTE — Telephone Encounter (Signed)
Pt called stating at her appt on 08/18/20 Dr. Ninfa Linden said he would call in a muscle relaxer and she would like to have that sent in, and would like to be notified when that's done.   [phone]

## 2020-08-19 NOTE — Telephone Encounter (Signed)
I just sent it in.  That was my fault.

## 2020-08-29 ENCOUNTER — Telehealth (INDEPENDENT_AMBULATORY_CARE_PROVIDER_SITE_OTHER): Payer: Self-pay

## 2020-08-29 NOTE — Telephone Encounter (Signed)
Patient called and left a detailed voice message and stated that she wanted to know when her next appointment is.  I called the patient and left a detailed voice message to let her know her next appointment time of 09/21/2020 at 4:15pm.

## 2020-09-06 ENCOUNTER — Encounter: Payer: Self-pay | Admitting: Physical Medicine and Rehabilitation

## 2020-09-06 ENCOUNTER — Ambulatory Visit: Payer: Self-pay

## 2020-09-06 ENCOUNTER — Ambulatory Visit (INDEPENDENT_AMBULATORY_CARE_PROVIDER_SITE_OTHER): Payer: Medicare Other | Admitting: Physical Medicine and Rehabilitation

## 2020-09-06 VITALS — BP 160/80 | HR 82

## 2020-09-06 DIAGNOSIS — M5416 Radiculopathy, lumbar region: Secondary | ICD-10-CM

## 2020-09-06 MED ORDER — METHYLPREDNISOLONE ACETATE 80 MG/ML IJ SUSP
40.0000 mg | Freq: Once | INTRAMUSCULAR | Status: AC
  Administered 2020-09-06: 40 mg

## 2020-09-06 NOTE — Procedures (Signed)
Lumbosacral Transforaminal Epidural Steroid Injection - Sub-Pedicular Approach with Fluoroscopic Guidance  Patient: Jeanette Compton      Date of Birth: 1955-07-03 MRN: [id number] PCP: Doree Albee, MD      Visit Date: 09/06/2020   Universal Protocol:    Date/Time: 09/06/2020  Consent Given By: the patient  Position: PRONE  Additional Comments: Vital signs were monitored before and after the procedure. Patient was prepped and draped in the usual sterile fashion. The correct patient, procedure, and site was verified.   Injection Procedure Details:   Procedure diagnoses: Lumbar radiculopathy [M54.16]    Meds Administered:  Meds ordered this encounter  Medications  . methylPREDNISolone acetate (DEPO-MEDROL) injection 40 mg    Laterality: Right  Location/Site:  L4-L5  Needle:5.0 in., 22 ga.  Short bevel or Quincke spinal needle  Needle Placement: Transforaminal  Findings:    -Comments: Excellent flow of contrast along the nerve, nerve root and into the epidural space.  Procedure Details: After squaring off the end-plates to get a true AP view, the C-arm was positioned so that an oblique view of the foramen as noted above was visualized. The target area is just inferior to the "nose of the scotty dog" or sub pedicular. The soft tissues overlying this structure were infiltrated with 2-3 ml. of 1% Lidocaine without Epinephrine.  The spinal needle was inserted toward the target using a "trajectory" view along the fluoroscope beam.  Under AP and lateral visualization, the needle was advanced so it did not puncture dura and was located close the 6 O'Clock position of the pedical in AP tracterory. Biplanar projections were used to confirm position. Aspiration was confirmed to be negative for CSF and/or blood. A 1-2 ml. volume of Isovue-250 was injected and flow of contrast was noted at each level. Radiographs were obtained for documentation purposes.   After attaining the  desired flow of contrast documented above, a 0.5 to 1.0 ml test dose of 0.25% Marcaine was injected into each respective transforaminal space.  The patient was observed for 90 seconds post injection.  After no sensory deficits were reported, and normal lower extremity motor function was noted,   the above injectate was administered so that equal amounts of the injectate were placed at each foramen (level) into the transforaminal epidural space.   Additional Comments:  The patient tolerated the procedure well Dressing: 2 x 2 sterile gauze and Band-Aid    Post-procedure details: Patient was observed during the procedure. Post-procedure instructions were reviewed.  Patient left the clinic in stable condition.

## 2020-09-06 NOTE — Patient Instructions (Signed)

## 2020-09-06 NOTE — Progress Notes (Signed)
Pt state lower back pain that travels down her right thigh and groin area. Pt state walking, standing and sitting makes the pain worse. Pt state she take over the counter pain meds to help ease her pain.  Numeric Pain Rating Scale and Functional Assessment Average Pain 6   In the last MONTH (on 0-10 scale) has pain interfered with the following?  1. General activity like being  able to carry out your everyday physical activities such as walking, climbing stairs, carrying groceries, or moving a chair?  Rating(10)   +Driver, -BT, -Dye Allergies.

## 2020-09-06 NOTE — Progress Notes (Signed)
[name] - 65 y.o. female MRN [id number]  Date of birth: 04-21-1956  Office Visit Note: Visit Date: 09/06/2020 PCP: Jeanette Albee, MD Referred by: Jeanette Albee, MD  Subjective: Chief Complaint  Patient presents with  . Lower Back - Pain  . Right Thigh - Pain   HPI:  Jeanette Compton is a 65 y.o. female who comes in today at the request of Dr. Jean Compton for planned Right L4-L5 Lumbar epidural steroid injection with fluoroscopic guidance.  The patient has failed conservative care including home exercise, medications, time and activity modification.  This injection will be diagnostic and hopefully therapeutic.  Please see requesting physician notes for further details and justification.   ROS Otherwise per HPI.  Assessment & Plan: Visit Diagnoses:    ICD-10-CM   1. Lumbar radiculopathy  M54.16 XR C-ARM NO REPORT    Epidural Steroid injection    methylPREDNISolone acetate (DEPO-MEDROL) injection 40 mg    Plan: No additional findings.   Meds & Orders:  Meds ordered this encounter  Medications  . methylPREDNISolone acetate (DEPO-MEDROL) injection 40 mg    Orders Placed This Encounter  Procedures  . XR C-ARM NO REPORT  . Epidural Steroid injection    Follow-up: Return in about 4 weeks (around 10/04/2020) for Jeanette Rosenthal, MD.   Procedures: No procedures performed  Lumbosacral Transforaminal Epidural Steroid Injection - Sub-Pedicular Approach with Fluoroscopic Guidance  Patient: Jeanette Compton      Date of Birth: 11/04/1955 MRN: [id number] PCP: Jeanette Albee, MD      Visit Date: 09/06/2020   Universal Protocol:    Date/Time: 09/06/2020  Consent Given By: the patient  Position: PRONE  Additional Comments: Vital signs were monitored before and after the procedure. Patient was prepped and draped in the usual sterile fashion. The correct patient, procedure, and site was verified.   Injection Procedure Details:   Procedure  diagnoses: Lumbar radiculopathy [M54.16]    Meds Administered:  Meds ordered this encounter  Medications  . methylPREDNISolone acetate (DEPO-MEDROL) injection 40 mg    Laterality: Right  Location/Site:  L4-L5  Needle:5.0 in., 22 ga.  Short bevel or Quincke spinal needle  Needle Placement: Transforaminal  Findings:    -Comments: Excellent flow of contrast along the nerve, nerve root and into the epidural space.  Procedure Details: After squaring off the end-plates to get a true AP view, the C-arm was positioned so that an oblique view of the foramen as noted above was visualized. The target area is just inferior to the "nose of the scotty dog" or sub pedicular. The soft tissues overlying this structure were infiltrated with 2-3 ml. of 1% Lidocaine without Epinephrine.  The spinal needle was inserted toward the target using a "trajectory" view along the fluoroscope beam.  Under AP and lateral visualization, the needle was advanced so it did not puncture dura and was located close the 6 O'Clock position of the pedical in AP tracterory. Biplanar projections were used to confirm position. Aspiration was confirmed to be negative for CSF and/or blood. A 1-2 ml. volume of Isovue-250 was injected and flow of contrast was noted at each level. Radiographs were obtained for documentation purposes.   After attaining the desired flow of contrast documented above, a 0.5 to 1.0 ml test dose of 0.25% Marcaine was injected into each respective transforaminal space.  The patient was observed for 90 seconds post injection.  After no sensory deficits were reported, and normal lower extremity motor function  was noted,   the above injectate was administered so that equal amounts of the injectate were placed at each foramen (level) into the transforaminal epidural space.   Additional Comments:  The patient tolerated the procedure well Dressing: 2 x 2 sterile gauze and Band-Aid    Post-procedure  details: Patient was observed during the procedure. Post-procedure instructions were reviewed.  Patient left the clinic in stable condition.      Clinical History: CLINICAL DATA:  Pain in right leg. Rule out herniated nucleus pulposis, right radicular pain. Additional history provided by scanning technologist: Patient reports pain in low back, right hip for 15-20 years, worse in the past 3 years, history of hip replacement.  EXAM: MRI LUMBAR SPINE WITHOUT CONTRAST  TECHNIQUE: Multiplanar, multisequence MR imaging of the lumbar spine was performed. No intravenous contrast was administered.  COMPARISON:  Lumbar spine radiographs 07/10/2020 and 02/05/2014.  FINDINGS: Segmentation: There are 5 lumbar vertebrae when correlating with prior lumbar spine radiographs. The S1 segment is somewhat transitional with a rudimentary but fairly well-formed S1-S2 interspace.  Alignment: Trace L1-L2 grade 1 retrolisthesis. 2 mm L5-S1 grade 1 anterolisthesis.  Vertebrae: Vertebral body height is maintained. No significant marrow edema or focal suspicious osseous lesion.  Conus medullaris and cauda equina: Conus extends to the L2 inferior endplate level. No signal abnormality identified within the visualized distal spinal cord.  Paraspinal and other soft tissues: Left renal cyst. Paraspinal soft tissues within normal limits.  Disc levels:  Mild multilevel disc degeneration greatest at L3-L4, L4-L5 and L5-S1.  T12-L1: Imaged sagittally. No significant disc herniation or stenosis.  L1-L2: Trace retrolisthesis. Facet arthrosis (greater on the right). No significant disc herniation or spinal canal stenosis. Mild relative right neural foraminal narrowing.  L2-L3: Small left subarticular/foraminal disc protrusion. Facet arthrosis. No significant spinal canal stenosis. Mild left neural foraminal narrowing.  L3-L4: Small disc bulge. Facet arthrosis with mild ligamentum  flavum hypertrophy. No significant spinal canal or foraminal stenosis.  L4-L5: Disc bulge. Superimposed shallow broad-based right subarticular/foraminal disc protrusion. Moderate facet arthrosis/ligamentum flavum hypertrophy. Slight prominence of the epidural fat. Mild relative spinal canal narrowing without appreciable nerve root impingement. Mild bilateral neural foraminal narrowing.  L5-S1: 2 mm grade 1 anterolisthesis. Disc uncovering with slight disc bulge. Facet arthrosis (moderate right, severe left). Ligamentum flavum hypertrophy. Mild relative left subarticular narrowing without nerve root impingement. Central canal patent. Moderate left neural foraminal narrowing.  S1-S2: Transitional level. No significant disc herniation or stenosis.  IMPRESSION: Spinal numbering as described. Slightly transitional S1 segment with rudimentary S1-S2 interspace.  Lumbar spondylosis as outlined with findings most notably as follows.  At L4-L5, there is a disc bulge. Superimposed shallow broad-based right subarticular/foraminal disc protrusion. Moderate facet arthrosis/ligamentum flavum hypertrophy. Slight prominence of the epidural fat. Mild relative spinal canal narrowing without appreciable nerve root impingement. Mild bilateral neural foraminal narrowing.  At L5-S1, there is 2 mm grade 1 anterolisthesis. Disc uncovering with slight disc bulge. Facet arthrosis (moderate right, severe left). Ligamentum flavum hypertrophy. Minimal left subarticular narrowing without nerve root impingement. Central canal patent. Moderate left neural foraminal narrowing.  No significant spinal canal stenosis at the remaining levels. Mild neural foraminal narrowing on the right at L1-L2 and on the left at L2-L3.   Electronically Signed   By: Kellie Simmering DO   On: 08/04/2020 09:29     Objective:  VS:  HT:    WT:   BMI:     BP:(!) 160/80  HR:82bpm  TEMP: ( )  RESP:  Physical  Exam Vitals and nursing note reviewed.  Constitutional:      General: She is not in acute distress.    Appearance: Normal appearance. She is not ill-appearing.  HENT:     Head: Normocephalic and atraumatic.     Right Ear: External ear normal.     Left Ear: External ear normal.  Eyes:     Extraocular Movements: Extraocular movements intact.  Cardiovascular:     Rate and Rhythm: Normal rate.     Pulses: Normal pulses.  Pulmonary:     Effort: Pulmonary effort is normal. No respiratory distress.  Abdominal:     General: There is no distension.     Palpations: Abdomen is soft.  Musculoskeletal:        General: Tenderness present.     Cervical back: Neck supple.     Right lower leg: No edema.     Left lower leg: No edema.     Comments: Patient has good distal strength with no pain over the greater trochanters.  No clonus or focal weakness.  Skin:    Findings: No erythema, lesion or rash.  Neurological:     General: No focal deficit present.     Mental Status: She is alert and oriented to person, place, and time.     Sensory: No sensory deficit.     Motor: No weakness or abnormal muscle tone.     Coordination: Coordination normal.  Psychiatric:        Mood and Affect: Mood normal.        Behavior: Behavior normal.      Imaging: No results found.

## 2020-09-21 ENCOUNTER — Ambulatory Visit (INDEPENDENT_AMBULATORY_CARE_PROVIDER_SITE_OTHER): Payer: Medicare Other | Admitting: Internal Medicine

## 2020-09-29 ENCOUNTER — Ambulatory Visit: Payer: Medicare Other | Admitting: Orthopaedic Surgery

## 2020-10-06 ENCOUNTER — Ambulatory Visit: Payer: Medicare Other | Admitting: Orthopaedic Surgery

## 2020-10-13 ENCOUNTER — Encounter (INDEPENDENT_AMBULATORY_CARE_PROVIDER_SITE_OTHER): Payer: Self-pay | Admitting: Internal Medicine

## 2020-10-13 ENCOUNTER — Ambulatory Visit (INDEPENDENT_AMBULATORY_CARE_PROVIDER_SITE_OTHER): Payer: Medicare Other | Admitting: Internal Medicine

## 2020-10-13 ENCOUNTER — Other Ambulatory Visit: Payer: Self-pay

## 2020-10-13 VITALS — BP 140/80 | HR 86 | Temp 97.6°F | Resp 18 | Ht 66.0 in | Wt 206.0 lb

## 2020-10-13 DIAGNOSIS — I10 Essential (primary) hypertension: Secondary | ICD-10-CM | POA: Diagnosis not present

## 2020-10-13 DIAGNOSIS — E119 Type 2 diabetes mellitus without complications: Secondary | ICD-10-CM | POA: Diagnosis not present

## 2020-10-13 DIAGNOSIS — E039 Hypothyroidism, unspecified: Secondary | ICD-10-CM | POA: Diagnosis not present

## 2020-10-13 NOTE — Progress Notes (Signed)
Metrics: Intervention Frequency ACO  Documented Smoking Status Yearly  Screened one or more times in 24 months  Cessation Counseling or  Active cessation medication Past 24 months  Past 24 months   Guideline developer: UpToDate (See UpToDate for funding source) Date Released: 2014       Wellness Office Visit  Subjective:  Patient ID: Jeanette Compton, female    DOB: [birth year]  Age: 65 y.o. MRN: [id number]  CC: This lady comes in for follow-up of diabetes, hypothyroidism, hypertension and obesity. HPI  She is doing better and the addition of Cytomel seems to have given her more energy.  She also is healthier than what she is eating now and has lost weight. She is trying to control her diabetes with diet alone. She continues on hydrochlorothiazide and losartan for her hypertension. Past Medical History:  Diagnosis Date  . Arthritis    knee , ankle and back  . Bronchitis   . Cataract   . Diabetes mellitus without complication ([place]) 8/93/7342  . Hyperlipidemia   . Hypertension   . Hypothyroidism, adult 02/10/2019  . Neuralgia, post-herpetic 11/13/2016  . Obesity (BMI 30.0-34.9) 02/10/2019  . Thyroid disease   . Vitamin D deficiency disease 02/10/2019   Past Surgical History:  Procedure Laterality Date  . ABDOMINAL HYSTERECTOMY     fibroid  . BREAST BIOPSY Right 07/31/2017   Procedure: EXCISIONAL BREAST BIOPSY WITH NEEDLE LOCALIZATION;  Surgeon: Virl Cagey, MD;  Location: [facility];  Service: General;  Laterality: Right;  . BREAST SURGERY Right   . COLONOSCOPY  09/13/2011   Procedure: COLONOSCOPY;  Surgeon: Rogene Houston, MD;  [facility];  Service: Endoscopy;  Laterality: N/A;  1030  . COLONOSCOPY N/A 07/31/2018   Procedure: COLONOSCOPY;  Surgeon: Rogene Houston, MD;  [facility];  Service: Endoscopy;  Laterality: N/A;  . POLYPECTOMY  07/31/2018   Procedure: POLYPECTOMY;  Surgeon: Rogene Houston, MD;  [facility];  Service: Endoscopy;;   . TOTAL HIP ARTHROPLASTY Right 12/13/2017   Procedure: RIGHT TOTAL HIP ARTHROPLASTY ANTERIOR APPROACH;  Surgeon: Mcarthur Rossetti, MD;  Location: [facility];  Service: Orthopedics;  Laterality: Right;     Family History  Problem Relation Age of Onset  . Stroke Mother   . Diabetes Mother   . Alcohol abuse Father   . Early death Father   . Early death Sister        pneumonia  . Cancer Brother   . Alzheimer's disease Sister   . COPD Brother   . Colon cancer Neg Hx     Social History   Social History Narrative      Comments: Divorced x 3. Single. Has 3 children (2 boys and 1 girl), 9 grandchildren, 1 great-grandchild. Disability from arthritis in back. 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      Social History   Tobacco Use  . Smoking status: Former Smoker    Packs/day: 0.50    Years: 3.00    Pack years: 1.50    Types: Cigarettes    Quit date: 1981    Years since quitting: 41.4  . Smokeless tobacco: Never Used  Substance Use Topics  . Alcohol use: No    Current Meds  Medication Sig  . acetaminophen-codeine (TYLENOL #3) 300-30 MG tablet Take 1-2 tablets by mouth every 8 (eight) hours as needed.  . Cholecalciferol (VITAMIN D3) 125 MCG (5000 UT) CAPS  Take 2 capsules by mouth daily.   . hydrochlorothiazide (MICROZIDE) 12.5 MG capsule Take 1 capsule (12.5 mg total) by mouth daily.  Marland Kitchen levothyroxine (SYNTHROID) 75 MCG tablet Take 1 tablet (75 mcg total) by mouth daily.  Marland Kitchen liothyronine (CYTOMEL) 5 MCG tablet Take 1 tablet (5 mcg total) by mouth daily.  Marland Kitchen losartan (COZAAR) 50 MG tablet Take 1 tablet (50 mg total) by mouth daily.  . Multiple Vitamin (MULTIVITAMIN WITH MINERALS) TABS tablet Take 1 tablet by mouth daily. One-A-Day  . Multiple Vitamins-Minerals (HAIR SKIN AND NAILS FORMULA) TABS Take 2 tablets by mouth daily.  . nabumetone (RELAFEN) 750 MG tablet TAKE (1) TABLET BY MOUTH TWICE DAILY AS NEEDED.  Marland Kitchen tiZANidine  (ZANAFLEX) 4 MG tablet Take 1 tablet (4 mg total) by mouth every 8 (eight) hours as needed for muscle spasms.     [facility] Office Visit from 06/22/2020 in [facility]  PHQ-9 Total Score 0      Objective:   Today's Vitals: BP 140/80 (BP Location: Left Arm, Patient Position: Sitting, Cuff Size: Normal)   Pulse 86   Temp 97.6 F (36.4 C) (Temporal)   Resp 18   Ht 5\' 6"  (1.676 m)   Wt 206 lb (93.4 kg)   SpO2 99%   BMI 33.25 kg/m  Vitals with BMI 10/13/2020 09/06/2020 07/18/2020  Height 5\' 6"  - 5\' 6"   Weight 206 lbs - 210 lbs  BMI 78.58 - 85.02  Systolic 774 128 -  Diastolic 80 80 -  Pulse 86 82 -     Physical Exam  She looks systemically well and remains obese but has lost 4 pounds in weight.  Blood pressure has also improved.     Assessment   1. Diabetes mellitus without complication ([place])   2. Essential hypertension   3. Hypothyroidism, adult       Tests ordered Orders Placed This Encounter  Procedures  . Basic metabolic panel  . Hemoglobin A1c  . T3, free  . TSH     Plan: 1. Continue with dietary management of her diabetes and we will check an A1c today. 2. Continue with hydrochlorothiazide and losartan and we will check renal function. 3. Continue with levothyroxine and Cytomel and check thyroid function.  We may need to adjust the dose of these medications. 4. I will see her in September for an annual physical exam   No orders of the defined types were placed in this encounter.   Doree Albee, MD

## 2020-10-14 LAB — BASIC METABOLIC PANEL
BUN: 16 mg/dL (ref 7–25)
CO2: 28 mmol/L (ref 20–32)
Calcium: 9.9 mg/dL (ref 8.6–10.4)
Chloride: 102 mmol/L (ref 98–110)
Creat: 0.73 mg/dL (ref 0.50–0.99)
Glucose, Bld: 140 mg/dL — ABNORMAL HIGH (ref 65–139)
Potassium: 4.2 mmol/L (ref 3.5–5.3)
Sodium: 141 mmol/L (ref 135–146)

## 2020-10-14 LAB — HEMOGLOBIN A1C
Hgb A1c MFr Bld: 7.4 % of total Hgb — ABNORMAL HIGH (ref <5.7)
Mean Plasma Glucose: 166 mg/dL
eAG (mmol/L): 9.2 mmol/L

## 2020-10-14 LAB — TSH: TSH: 1.17 mIU/L (ref 0.40–4.50)

## 2020-10-14 LAB — T3, FREE: T3, Free: 3.2 pg/mL (ref 2.3–4.2)

## 2020-10-18 ENCOUNTER — Other Ambulatory Visit (INDEPENDENT_AMBULATORY_CARE_PROVIDER_SITE_OTHER): Payer: Self-pay | Admitting: Internal Medicine

## 2020-10-18 NOTE — Progress Notes (Signed)
Please call the patient and let her know that her thyroid tests are somewhat better so continue to focus on nutrition.  We may increase the dose of thyroid next time.  Her diabetes is about the same.  This should improve when I see her the next time.  Follow-up as scheduled.

## 2020-10-18 NOTE — Progress Notes (Signed)
Pt was called and given the last result recommendations from Dr Anastasio Champion. Pt agree with information. Pt will work on her diet. Because of sean change and she is going cut back a great deal.

## 2020-10-24 ENCOUNTER — Ambulatory Visit (INDEPENDENT_AMBULATORY_CARE_PROVIDER_SITE_OTHER): Payer: Medicare Other | Admitting: Orthopaedic Surgery

## 2020-10-24 ENCOUNTER — Other Ambulatory Visit: Payer: Self-pay

## 2020-10-24 DIAGNOSIS — M5441 Lumbago with sciatica, right side: Secondary | ICD-10-CM | POA: Diagnosis not present

## 2020-10-24 DIAGNOSIS — G8929 Other chronic pain: Secondary | ICD-10-CM | POA: Diagnosis not present

## 2020-10-24 DIAGNOSIS — M79604 Pain in right leg: Secondary | ICD-10-CM

## 2020-10-24 DIAGNOSIS — M25561 Pain in right knee: Secondary | ICD-10-CM

## 2020-10-24 MED ORDER — METHYLPREDNISOLONE ACETATE 40 MG/ML IJ SUSP
40.0000 mg | INTRAMUSCULAR | Status: AC | PRN
  Administered 2020-10-24: 40 mg via INTRA_ARTICULAR

## 2020-10-24 MED ORDER — LIDOCAINE HCL 1 % IJ SOLN
3.0000 mL | INTRAMUSCULAR | Status: AC | PRN
  Administered 2020-10-24: 3 mL

## 2020-10-24 NOTE — Progress Notes (Signed)
Office Visit Note   Patient: Jeanette Compton           Date of Birth: [birth year]           MRN: [id number] Visit Date: 10/24/2020              Requested by: Doree Albee, MD [address] [place],  [place] [address] PCP: Doree Albee, MD   Assessment & Plan: Visit Diagnoses:  1. Acute right-sided low back pain with right-sided sciatica   2. Pain in right leg   3. Chronic pain of right knee     Plan: I felt it was worthwhile trying hopefully diagnostic and therapeutic steroid injection in her right knee and she agreed to this and tolerated it well.  I would like to see her back in just 2 weeks.  At that visit we can certainly x-ray her right knee since we have not done that and then go from there in terms of what the next treatment modality will be for her.  All questions and concerns were answered and addressed.  Follow-Up Instructions: Return in about 2 years (around 10/25/2022).   Orders:  Orders Placed This Encounter  Procedures  . Large Joint Inj   No orders of the defined types were placed in this encounter.     Procedures: Large Joint Inj: R knee on 10/24/2020 3:11 PM Indications: diagnostic evaluation and pain Details: 22 G 1.5 in needle, superolateral approach  Arthrogram: No  Medications: 3 mL lidocaine 1 %; 40 mg methylPREDNISolone acetate 40 MG/ML Outcome: tolerated well, no immediate complications Procedure, treatment alternatives, risks and benefits explained, specific risks discussed. Consent was given by the patient. Immediately prior to procedure a time out was called to verify the correct patient, procedure, equipment, support staff and site/side marked as required. Patient was prepped and draped in the usual sterile fashion.       Clinical Data: No additional findings.   Subjective: Chief Complaint  Patient presents with  . Right Knee - Pain  . Right Hip - Pain  The patient comes in today with continued right lower extremity pain and  radicular symptoms.  We will send her to Dr. Ernestina Patches for right-sided L4-L5 injection due to a symptomatic herniated disc that was foraminal to the right side.  She complains more today of right knee pain.  I have replaced her right hip we have checked on that and we saw no issues with the right hip.  She feels like the injection in her spine did not help at all.  HPI  Review of Systems She currently denies a headache, chest pain, shortness of breath, fever, chills, nausea, vomiting  Objective: Vital Signs: There were no vitals taken for this visit.  Physical Exam She is alert and orient x3 in no acute distress Ortho Exam She does exhibit right knee pain on my examination of the right knee.  She is walking with a limp.  She has a positive straight leg raise on the right side as well. Specialty Comments:  No specialty comments available.  Imaging: No results found.   PMFS History: Patient Active Problem List   Diagnosis Date Noted  . Encounter for general adult medical examination with abnormal findings 06/09/2019  . Hypothyroidism, adult 02/10/2019  . Obesity (BMI 30.0-34.9) 02/10/2019  . Vitamin D deficiency disease 02/10/2019  . Diabetes mellitus without complication ([place]) 09/73/5329  . Positive FIT (fecal immunochemical test) 07/29/2018  . Rectal bleeding 07/29/2018  .  Status post total replacement of right hip 12/13/2017  . Unilateral primary osteoarthritis, right hip 09/11/2017  . Papilloma of right breast   . Neuralgia, post-herpetic 11/13/2016  . Elevated blood sugar 11/13/2016  . History of colonic polyps 10/05/2016  . HLD (hyperlipidemia) 09/10/2016  . Essential hypertension 09/10/2016  . Primary vitiligo 09/10/2016  . Osteoarthritis of knee 09/10/2016  . Chronic lower back pain 09/10/2016   Past Medical History:  Diagnosis Date  . Arthritis    knee , ankle and back  . Bronchitis   . Cataract   . Diabetes mellitus without complication ([place]) 9/62/8366  .  Hyperlipidemia   . Hypertension   . Hypothyroidism, adult 02/10/2019  . Neuralgia, post-herpetic 11/13/2016  . Obesity (BMI 30.0-34.9) 02/10/2019  . Thyroid disease   . Vitamin D deficiency disease 02/10/2019    Family History  Problem Relation Age of Onset  . Stroke Mother   . Diabetes Mother   . Alcohol abuse Father   . Early death Father   . Early death Sister        pneumonia  . Cancer Brother   . Alzheimer's disease Sister   . COPD Brother   . Colon cancer Neg Hx     Past Surgical History:  Procedure Laterality Date  . ABDOMINAL HYSTERECTOMY     fibroid  . BREAST BIOPSY Right 07/31/2017   Procedure: EXCISIONAL BREAST BIOPSY WITH NEEDLE LOCALIZATION;  Surgeon: Virl Cagey, MD;  Location: [facility];  Service: General;  Laterality: Right;  . BREAST SURGERY Right   . COLONOSCOPY  09/13/2011   Procedure: COLONOSCOPY;  Surgeon: Rogene Houston, MD;  [facility];  Service: Endoscopy;  Laterality: N/A;  1030  . COLONOSCOPY N/A 07/31/2018   Procedure: COLONOSCOPY;  Surgeon: Rogene Houston, MD;  [facility];  Service: Endoscopy;  Laterality: N/A;  . POLYPECTOMY  07/31/2018   Procedure: POLYPECTOMY;  Surgeon: Rogene Houston, MD;  [facility];  Service: Endoscopy;;  . TOTAL HIP ARTHROPLASTY Right 12/13/2017   Procedure: RIGHT TOTAL HIP ARTHROPLASTY ANTERIOR APPROACH;  Surgeon: Mcarthur Rossetti, MD;  Location: [facility];  Service: Orthopedics;  Laterality: Right;   Social History   Occupational History  . Occupation: disability  Tobacco Use  . Smoking status: Former Smoker    Packs/day: 0.50    Years: 3.00    Pack years: 1.50    Types: Cigarettes    Quit date: 1981    Years since quitting: 41.4  . Smokeless tobacco: Never Used  Vaping Use  . Vaping Use: Never used  Substance and Sexual Activity  . Alcohol use: No  . Drug use: Yes    Types: Marijuana  . Sexual activity: Never    Birth control/protection: Surgical

## 2020-10-28 ENCOUNTER — Telehealth: Payer: Self-pay | Admitting: Orthopaedic Surgery

## 2020-10-28 ENCOUNTER — Other Ambulatory Visit: Payer: Self-pay | Admitting: Orthopaedic Surgery

## 2020-10-28 MED ORDER — ACETAMINOPHEN-CODEINE #3 300-30 MG PO TABS: 1.0000 | ORAL_TABLET | Freq: Three times a day (TID) | ORAL | 0 refills | Status: DC | PRN

## 2020-10-28 NOTE — Telephone Encounter (Signed)
Please advise 

## 2020-10-28 NOTE — Telephone Encounter (Signed)
Patient called asked if she can get something called into her pharmacy for the pain she is having in her  hip and knee. Patient uses [facility] in [place]. The number to contact patient is [phone]

## 2020-10-31 DIAGNOSIS — M5441 Lumbago with sciatica, right side: Secondary | ICD-10-CM | POA: Diagnosis not present

## 2020-10-31 DIAGNOSIS — M9903 Segmental and somatic dysfunction of lumbar region: Secondary | ICD-10-CM | POA: Diagnosis not present

## 2020-10-31 DIAGNOSIS — M9902 Segmental and somatic dysfunction of thoracic region: Secondary | ICD-10-CM | POA: Diagnosis not present

## 2020-10-31 DIAGNOSIS — M546 Pain in thoracic spine: Secondary | ICD-10-CM | POA: Diagnosis not present

## 2020-10-31 DIAGNOSIS — M9905 Segmental and somatic dysfunction of pelvic region: Secondary | ICD-10-CM | POA: Diagnosis not present

## 2020-11-04 DIAGNOSIS — M9903 Segmental and somatic dysfunction of lumbar region: Secondary | ICD-10-CM | POA: Diagnosis not present

## 2020-11-04 DIAGNOSIS — M9905 Segmental and somatic dysfunction of pelvic region: Secondary | ICD-10-CM | POA: Diagnosis not present

## 2020-11-04 DIAGNOSIS — M546 Pain in thoracic spine: Secondary | ICD-10-CM | POA: Diagnosis not present

## 2020-11-04 DIAGNOSIS — M9902 Segmental and somatic dysfunction of thoracic region: Secondary | ICD-10-CM | POA: Diagnosis not present

## 2020-11-04 DIAGNOSIS — M5441 Lumbago with sciatica, right side: Secondary | ICD-10-CM | POA: Diagnosis not present

## 2020-11-07 ENCOUNTER — Other Ambulatory Visit: Payer: Self-pay

## 2020-11-07 ENCOUNTER — Ambulatory Visit (INDEPENDENT_AMBULATORY_CARE_PROVIDER_SITE_OTHER): Payer: Medicare Other | Admitting: Orthopaedic Surgery

## 2020-11-07 ENCOUNTER — Other Ambulatory Visit (INDEPENDENT_AMBULATORY_CARE_PROVIDER_SITE_OTHER): Payer: Self-pay | Admitting: Internal Medicine

## 2020-11-07 ENCOUNTER — Encounter: Payer: Self-pay | Admitting: Orthopaedic Surgery

## 2020-11-07 ENCOUNTER — Ambulatory Visit: Payer: Self-pay

## 2020-11-07 DIAGNOSIS — G8929 Other chronic pain: Secondary | ICD-10-CM

## 2020-11-07 DIAGNOSIS — M25561 Pain in right knee: Secondary | ICD-10-CM

## 2020-11-07 NOTE — Progress Notes (Signed)
Office Visit Note   Patient: Jeanette Compton           Date of Birth: [birth year]           MRN: [id number] Visit Date: 11/07/2020              Requested by: Doree Albee, MD [address] [place],  [place] [address] PCP: Doree Albee, MD   Assessment & Plan: Visit Diagnoses:  1. Chronic pain of right knee     Plan: At this point given the severity of her right knee pain with mechanical symptoms combined with the failure of conservative treatment, a MRI of the right knee is warranted to rule out a meniscal tear.  We will see her back after the MRI.  Follow-Up Instructions: No follow-ups on file.   Orders:  Orders Placed This Encounter  Procedures   XR Knee 1-2 Views Right   No orders of the defined types were placed in this encounter.     Procedures: No procedures performed   Clinical Data: No additional findings.   Subjective: Chief Complaint  Patient presents with   Right Knee - Follow-up  The patient continues to follow-up with significant and severe right knee pain that is worse with weightbearing.  I saw her few weeks ago and had her work on quad strengthening exercises.  She has been through chiropractic treatment for her back.  We have had her on anti-inflammatories orally.  I did provide a steroid injection in her right knee which only helped for about 2 days.  She reports still severe pain with the right knee.  She is walking with a limp.  She describes locking and catching of the right knee as well.  She has a history of a right total hip arthroplasty that we did in 2019 and she does have a history of significant lumbar spine disease.  She recently had an intervention in her lumbar spine by Dr. Ernestina Patches that she says did not help at all.  HPI  Review of Systems   Objective: Vital Signs: There were no vitals taken for this visit.  Physical Exam She is alert and orient x3 and in no acute distress Ortho Exam  Examination her left knee shows global  tenderness with no effusion.  She has slight varus malalignment.  She has a positive McMurray's exam to the medial compartment and severe pain with range of motion of the knee. specialty Comments:  No specialty comments available.  Imaging: XR Knee 1-2 Views Right  Result Date: 11/07/2020 2 views left knee show no acute findings.  The medial lateral spaces are well-maintained.  There is some patella from arthritic changes.    PMFS History: Patient Active Problem List   Diagnosis Date Noted   Encounter for general adult medical examination with abnormal findings 06/09/2019   Hypothyroidism, adult 02/10/2019   Obesity (BMI 30.0-34.9) 02/10/2019   Vitamin D deficiency disease 02/10/2019   Diabetes mellitus without complication ([place]) 91/69/4503   Positive FIT (fecal immunochemical test) 07/29/2018   Rectal bleeding 07/29/2018   Status post total replacement of right hip 12/13/2017   Unilateral primary osteoarthritis, right hip 09/11/2017   Papilloma of right breast    Neuralgia, post-herpetic 11/13/2016   Elevated blood sugar 11/13/2016   History of colonic polyps 10/05/2016   HLD (hyperlipidemia) 09/10/2016   Essential hypertension 09/10/2016   Primary vitiligo 09/10/2016   Osteoarthritis of knee 09/10/2016   Chronic lower back pain 09/10/2016  Past Medical History:  Diagnosis Date   Arthritis    knee , ankle and back   Bronchitis    Cataract    Diabetes mellitus without complication ([place]) 5/88/5027   Hyperlipidemia    Hypertension    Hypothyroidism, adult 02/10/2019   Neuralgia, post-herpetic 11/13/2016   Obesity (BMI 30.0-34.9) 02/10/2019   Thyroid disease    Vitamin D deficiency disease 02/10/2019    Family History  Problem Relation Age of Onset   Stroke Mother    Diabetes Mother    Alcohol abuse Father    Early death Father    Early death Sister        pneumonia   Cancer Brother    Alzheimer's disease Sister    COPD Brother    Colon cancer Neg Hx     Past  Surgical History:  Procedure Laterality Date   ABDOMINAL HYSTERECTOMY     fibroid   BREAST BIOPSY Right 07/31/2017   Procedure: EXCISIONAL BREAST BIOPSY WITH NEEDLE LOCALIZATION;  Surgeon: Virl Cagey, MD;  Location: [facility];  Service: General;  Laterality: Right;   BREAST SURGERY Right    COLONOSCOPY  09/13/2011   Procedure: COLONOSCOPY;  Surgeon: Rogene Houston, MD;  [facility];  Service: Endoscopy;  Laterality: N/A;  1030   COLONOSCOPY N/A 07/31/2018   Procedure: COLONOSCOPY;  Surgeon: Rogene Houston, MD;  [facility];  Service: Endoscopy;  Laterality: N/A;   POLYPECTOMY  07/31/2018   Procedure: POLYPECTOMY;  Surgeon: Rogene Houston, MD;  [facility];  Service: Endoscopy;;   TOTAL HIP ARTHROPLASTY Right 12/13/2017   Procedure: RIGHT TOTAL HIP ARTHROPLASTY ANTERIOR APPROACH;  Surgeon: Mcarthur Rossetti, MD;  Location: [facility];  Service: Orthopedics;  Laterality: Right;   Social History   Occupational History   Occupation: disability  Tobacco Use   Smoking status: Former    Packs/day: 0.50    Years: 3.00    Pack years: 1.50    Types: Cigarettes    Quit date: 1981    Years since quitting: 41.4   Smokeless tobacco: Never  Vaping Use   Vaping Use: Never used  Substance and Sexual Activity   Alcohol use: No   Drug use: Yes    Types: Marijuana   Sexual activity: Never    Birth control/protection: Surgical

## 2020-11-08 ENCOUNTER — Other Ambulatory Visit (INDEPENDENT_AMBULATORY_CARE_PROVIDER_SITE_OTHER): Payer: Self-pay | Admitting: Internal Medicine

## 2020-11-09 ENCOUNTER — Telehealth: Payer: Self-pay

## 2020-11-09 ENCOUNTER — Other Ambulatory Visit: Payer: Self-pay | Admitting: Orthopaedic Surgery

## 2020-11-09 MED ORDER — TRAMADOL HCL 50 MG PO TABS: 100.0000 mg | ORAL_TABLET | Freq: Four times a day (QID) | ORAL | 0 refills | Status: DC | PRN

## 2020-11-09 NOTE — Telephone Encounter (Signed)
Patient called she is having a great amount of pain, she stated the tylenol isn't working she is requesting a stronger rx call back:[phone]

## 2020-11-09 NOTE — Telephone Encounter (Signed)
Please advise 

## 2020-11-11 DIAGNOSIS — M9905 Segmental and somatic dysfunction of pelvic region: Secondary | ICD-10-CM | POA: Diagnosis not present

## 2020-11-11 DIAGNOSIS — M9903 Segmental and somatic dysfunction of lumbar region: Secondary | ICD-10-CM | POA: Diagnosis not present

## 2020-11-11 DIAGNOSIS — M546 Pain in thoracic spine: Secondary | ICD-10-CM | POA: Diagnosis not present

## 2020-11-11 DIAGNOSIS — M5441 Lumbago with sciatica, right side: Secondary | ICD-10-CM | POA: Diagnosis not present

## 2020-11-11 DIAGNOSIS — M9902 Segmental and somatic dysfunction of thoracic region: Secondary | ICD-10-CM | POA: Diagnosis not present

## 2020-11-16 ENCOUNTER — Other Ambulatory Visit: Payer: Self-pay | Admitting: Family Medicine

## 2020-11-17 ENCOUNTER — Other Ambulatory Visit (INDEPENDENT_AMBULATORY_CARE_PROVIDER_SITE_OTHER): Payer: Self-pay | Admitting: Internal Medicine

## 2020-11-19 ENCOUNTER — Ambulatory Visit
Admission: RE | Admit: 2020-11-19 | Discharge: 2020-11-19 | Disposition: A | Discharge status: Home / Self Care | Payer: Medicare Other | Source: Ambulatory Visit | Attending: Orthopaedic Surgery | Admitting: Orthopaedic Surgery

## 2020-11-19 DIAGNOSIS — M25561 Pain in right knee: Secondary | ICD-10-CM

## 2020-11-23 DIAGNOSIS — M5441 Lumbago with sciatica, right side: Secondary | ICD-10-CM | POA: Diagnosis not present

## 2020-11-23 DIAGNOSIS — M9902 Segmental and somatic dysfunction of thoracic region: Secondary | ICD-10-CM | POA: Diagnosis not present

## 2020-11-23 DIAGNOSIS — M546 Pain in thoracic spine: Secondary | ICD-10-CM | POA: Diagnosis not present

## 2020-11-23 DIAGNOSIS — M9903 Segmental and somatic dysfunction of lumbar region: Secondary | ICD-10-CM | POA: Diagnosis not present

## 2020-11-23 DIAGNOSIS — M9905 Segmental and somatic dysfunction of pelvic region: Secondary | ICD-10-CM | POA: Diagnosis not present

## 2020-12-05 ENCOUNTER — Other Ambulatory Visit: Payer: Self-pay

## 2020-12-05 ENCOUNTER — Ambulatory Visit (INDEPENDENT_AMBULATORY_CARE_PROVIDER_SITE_OTHER): Payer: Medicare Other | Admitting: Orthopaedic Surgery

## 2020-12-05 DIAGNOSIS — G8929 Other chronic pain: Secondary | ICD-10-CM

## 2020-12-05 DIAGNOSIS — M25561 Pain in right knee: Secondary | ICD-10-CM

## 2020-12-05 DIAGNOSIS — M5441 Lumbago with sciatica, right side: Secondary | ICD-10-CM

## 2020-12-05 MED ORDER — HYDROCODONE-ACETAMINOPHEN 5-325 MG PO TABS: 1.0000 | ORAL_TABLET | Freq: Four times a day (QID) | ORAL | 0 refills | Status: DC | PRN

## 2020-12-05 NOTE — Addendum Note (Signed)
Addended by: Meyer Cory on: 12/05/2020 11:29 AM   Modules accepted: Orders

## 2020-12-05 NOTE — Progress Notes (Signed)
The patient was coming in today to go over MRI of her right knee.  However, she is having worsening sciatica down her right backside and on her right entire leg.  She actually had a MRI of her lumbar spine in March of this year.  That MRI did show a disc bulge at L4-L5 to the right side that had a right subarticular and foraminal disc protrusion that was shallow and broad-based.  She also has moderate facet arthropathy in that area.  At L5-S1 there is a 2 mm grade 1 anterolisthesis.  Her son is with her today and is quite concerned that we are not do anything for her in terms of the pain that is causing her to cry at night.  She does not appear uncomfortable today at all.  She has had a right-sided epidural steroid injection in April which she said did not help at all.  This was done by Dr. Ernestina Patches.  When she came at her last visit her right knee had been bothering her.  The right knee MRI shows a small area of full-thickness cartilage loss in the weightbearing surface of the central medial femoral condyle and a degenerative signal in the medial meniscus.  On exam today her pain seems to be more sciatic related.  There is been no change in bowel bladder function.  She is ambulating using a cane.  She does not appear in any acute distress.  She does state that a family member had surgery by Dr. Katherine Roan with [facility] in [place] for her lumbar spine and is done quite well.  She is requesting referral to Dr. Katherine Roan and I think this is reasonable now given the pain that the patient is having and the pain is bringing her to tears according to her son.  I will send in some hydrocodone in the interim and we will see if we can expedite a referral to Dr. Katherine Roan to consider other treatment modalities such as surgery on the patient's lumbar spine.

## 2020-12-11 ENCOUNTER — Emergency Department (HOSPITAL_COMMUNITY): Payer: Medicare Other

## 2020-12-11 ENCOUNTER — Emergency Department (HOSPITAL_COMMUNITY)
Admission: EM | Admit: 2020-12-11 | Discharge: 2020-12-11 | Disposition: A | Discharge status: Home / Self Care | Payer: Medicare Other | Attending: Emergency Medicine | Admitting: Emergency Medicine

## 2020-12-11 ENCOUNTER — Encounter (HOSPITAL_COMMUNITY): Payer: Self-pay | Admitting: Emergency Medicine

## 2020-12-11 ENCOUNTER — Other Ambulatory Visit: Payer: Self-pay

## 2020-12-11 DIAGNOSIS — Z96641 Presence of right artificial hip joint: Secondary | ICD-10-CM | POA: Diagnosis not present

## 2020-12-11 DIAGNOSIS — I10 Essential (primary) hypertension: Secondary | ICD-10-CM | POA: Diagnosis not present

## 2020-12-11 DIAGNOSIS — M25561 Pain in right knee: Secondary | ICD-10-CM | POA: Insufficient documentation

## 2020-12-11 DIAGNOSIS — M545 Low back pain, unspecified: Secondary | ICD-10-CM | POA: Diagnosis not present

## 2020-12-11 DIAGNOSIS — E119 Type 2 diabetes mellitus without complications: Secondary | ICD-10-CM | POA: Insufficient documentation

## 2020-12-11 DIAGNOSIS — G8929 Other chronic pain: Secondary | ICD-10-CM | POA: Diagnosis not present

## 2020-12-11 DIAGNOSIS — E039 Hypothyroidism, unspecified: Secondary | ICD-10-CM | POA: Diagnosis not present

## 2020-12-11 DIAGNOSIS — Z87891 Personal history of nicotine dependence: Secondary | ICD-10-CM | POA: Diagnosis not present

## 2020-12-11 DIAGNOSIS — M25761 Osteophyte, right knee: Secondary | ICD-10-CM | POA: Diagnosis not present

## 2020-12-11 DIAGNOSIS — Z79899 Other long term (current) drug therapy: Secondary | ICD-10-CM | POA: Diagnosis not present

## 2020-12-11 MED ORDER — METHOCARBAMOL 500 MG PO TABS: 500.0000 mg | ORAL_TABLET | Freq: Two times a day (BID) | ORAL | 0 refills | Status: DC | PRN

## 2020-12-11 MED ORDER — LOSARTAN POTASSIUM 50 MG PO TABS
50.0000 mg | ORAL_TABLET | Freq: Once | ORAL | Status: AC
  Administered 2020-12-11: 50 mg via ORAL
  Filled 2020-12-11: qty 1

## 2020-12-11 MED ORDER — HYDROCHLOROTHIAZIDE 12.5 MG PO CAPS
12.5000 mg | ORAL_CAPSULE | Freq: Every day | ORAL | Status: DC
  Administered 2020-12-11: 12.5 mg via ORAL
  Filled 2020-12-11: qty 1

## 2020-12-11 MED ORDER — DEXAMETHASONE SODIUM PHOSPHATE 10 MG/ML IJ SOLN
8.0000 mg | Freq: Once | INTRAMUSCULAR | Status: AC
  Administered 2020-12-11: 8 mg via INTRAMUSCULAR
  Filled 2020-12-11: qty 1

## 2020-12-11 NOTE — ED Provider Notes (Signed)
Emergency Medicine Provider Triage Evaluation Note  Jeanette Compton , a 65 y.o. female  was evaluated in triage.  Pt complains of back, right hip, and right knee pain.  Patient states yesterday her knee gave out on her.  She reports no new pain in her back or her hip, but then asks for something for her knee.  She follows with Dr. Ninfa Linden from orthopedics, who has referred her to neurosurgery.  She has been told she has a disc problem.  When her leg gave out on her yesterday, she did not fall to the ground, she was able to catch herself.  Review of Systems  Positive: Back, r hip, and r knee pain Negative: numbness  Physical Exam  BP (!) 184/86   Pulse 67   Temp 99.1 F (37.3 C) (Oral)   Resp 16   SpO2 99%  Gen:   Awake, no distress   Resp:  Normal effort  MSK:   Ttp of back and R upper leg   Medical Decision Making  Medically screening exam initiated at 2:27 PM.  Appropriate orders placed.  [name] was informed that the remainder of the evaluation will be completed by another provider, this initial triage assessment does not replace that evaluation, and the importance of remaining in the ED until their evaluation is complete.  Sherle Poe, PA-C 12/11/20 1429    Luna Fuse, MD 12/13/20 [phone]

## 2020-12-11 NOTE — ED Triage Notes (Signed)
C/o R knee, hip, and back pain.  States R knee "gave out" yesterday.  Denies fall.  History of R hip replacement and chronic back pain.

## 2020-12-11 NOTE — ED Provider Notes (Addendum)
[facility] EMERGENCY DEPARTMENT Provider Note   CSN: [id number] Arrival date & time: 12/11/20  1245     History No chief complaint on file.   Jeanette Compton is a 65 y.o. female.  HPI  Patient is a 65 year old female with a history of obesity, diabetes mellitus, hypertension, hyperlipidemia, total right hip arthroplasty in 2019 who presents to the emergency department with chronic low back, right hip, and right knee pain.  Patient states that yesterday her knee gave out on her.  Denies any new pain in the region but states that her knee pain has been persistent since her right hip arthroplasty in 2019.  She has also been told "she has a disc problem".  She states that she has a follow-up with orthopedics in [place] but has not been evaluated as of yet.  Denies any recent falls or trauma.  No numbness, weakness, bowel or bladder incontinence.     Past Medical History:  Diagnosis Date   Arthritis    knee , ankle and back   Bronchitis    Cataract    Diabetes mellitus without complication ([place]) [id number]   Hyperlipidemia    Hypertension    Hypothyroidism, adult 02/10/2019   Neuralgia, post-herpetic 11/13/2016   Obesity (BMI 30.0-34.9) 02/10/2019   Thyroid disease    Vitamin D deficiency disease 02/10/2019    Patient Active Problem List   Diagnosis Date Noted   Encounter for general adult medical examination with abnormal findings 06/09/2019   Hypothyroidism, adult 02/10/2019   Obesity (BMI 30.0-34.9) 02/10/2019   Vitamin D deficiency disease 02/10/2019   Diabetes mellitus without complication ([place]) [id number]   Positive FIT (fecal immunochemical test) 07/29/2018   Rectal bleeding 07/29/2018   Status post total replacement of right hip 12/13/2017   Unilateral primary osteoarthritis, right hip 09/11/2017   Papilloma of right breast    Neuralgia, post-herpetic 11/13/2016   Elevated blood sugar 11/13/2016   History of colonic polyps 10/05/2016   HLD  (hyperlipidemia) 09/10/2016   Essential hypertension 09/10/2016   Primary vitiligo 09/10/2016   Osteoarthritis of knee 09/10/2016   Chronic lower back pain 09/10/2016    Past Surgical History:  Procedure Laterality Date   ABDOMINAL HYSTERECTOMY     fibroid   BREAST BIOPSY Right 07/31/2017   Procedure: EXCISIONAL BREAST BIOPSY WITH NEEDLE LOCALIZATION;  Surgeon: Virl Cagey, MD;  Location: [facility];  Service: General;  Laterality: Right;   BREAST SURGERY Right    COLONOSCOPY  09/13/2011   Procedure: COLONOSCOPY;  Surgeon: Rogene Houston, MD;  [facility];  Service: Endoscopy;  Laterality: N/A;  1030   COLONOSCOPY N/A 07/31/2018   Procedure: COLONOSCOPY;  Surgeon: Rogene Houston, MD;  [facility];  Service: Endoscopy;  Laterality: N/A;   POLYPECTOMY  07/31/2018   Procedure: POLYPECTOMY;  Surgeon: Rogene Houston, MD;  [facility];  Service: Endoscopy;;   TOTAL HIP ARTHROPLASTY Right 12/13/2017   Procedure: RIGHT TOTAL HIP ARTHROPLASTY ANTERIOR APPROACH;  Surgeon: Mcarthur Rossetti, MD;  Location[facility];  Service: Orthopedics;  Laterality: Right;     OB History   No obstetric history on file.     Family History  Problem Relation Age of Onset   Stroke Mother    Diabetes Mother    Alcohol abuse Father    Early death Father    Early death Sister        pneumonia   Cancer Brother    Alzheimer's  disease Sister    COPD Brother    Colon cancer Neg Hx     Social History   Tobacco Use   Smoking status: Former    Packs/day: 0.50    Years: 3.00    Pack years: 1.50    Types: Cigarettes    Quit date: 1981    Years since quitting: 41.5   Smokeless tobacco: Never  Vaping Use   Vaping Use: Never used  Substance Use Topics   Alcohol use: No   Drug use: Yes    Types: Marijuana    Home Medications Prior to Admission medications   Medication Sig Start Date End Date Taking? Authorizing Provider  methocarbamol (ROBAXIN) 500 MG  tablet Take 1 tablet (500 mg total) by mouth 2 (two) times daily as needed for muscle spasms. 12/11/20  Yes Rayna Sexton, PA-C  acetaminophen-codeine (TYLENOL #3) 300-30 MG tablet Take 1-2 tablets by mouth every 8 (eight) hours as needed. Patient not taking: Reported on 12/05/2020 10/28/20   Mcarthur Rossetti, MD  Cholecalciferol (VITAMIN D3) 125 MCG (5000 UT) CAPS Take 2 capsules by mouth daily.     [provider]  hydrochlorothiazide (MICROZIDE) 12.5 MG capsule TAKE 1 CAPSULE BY MOUTH ONCE A DAY. 11/17/20   Ailene Ards, NP  HYDROcodone-acetaminophen (NORCO/VICODIN) 5-325 MG tablet Take 1 tablet by mouth every 6 (six) hours as needed for moderate pain. 12/05/20   Mcarthur Rossetti, MD  levothyroxine (SYNTHROID) 75 MCG tablet TAKE ONE TABLET BY MOUTH ONCE DAILY. 11/08/20 02/06/21  Doree Albee, MD  liothyronine (CYTOMEL) 5 MCG tablet TAKE ONE TABLET BY MOUTH ONCE DAILY. 11/17/20   Ailene Ards, NP  losartan (COZAAR) 50 MG tablet TAKE (1) TABLET BY MOUTH ONCE DAILY. 11/07/20   Doree Albee, MD  Multiple Vitamin (MULTIVITAMIN WITH MINERALS) TABS tablet Take 1 tablet by mouth daily. One-A-Day    [provider]  Multiple Vitamins-Minerals (HAIR SKIN AND NAILS FORMULA) TABS Take 2 tablets by mouth daily.    [provider]  nabumetone (RELAFEN) 750 MG tablet TAKE (1) TABLET BY MOUTH TWICE DAILY AS NEEDED. 06/29/20   Mcarthur Rossetti, MD  traMADol (ULTRAM) 50 MG tablet Take 2 tablets (100 mg total) by mouth every 6 (six) hours as needed. 11/09/20   Mcarthur Rossetti, MD    Allergies    Lisinopril  Review of Systems   Review of Systems  Musculoskeletal:  Positive for arthralgias, back pain and myalgias.  Neurological:  Negative for weakness and numbness.   Physical Exam Updated Vital Signs BP (!) 184/86   Pulse 67   Temp 99.1 F (37.3 C) (Oral)   Resp 16   SpO2 99%   Physical Exam Vitals and nursing note reviewed.  Constitutional:       General: She is not in acute distress.    Appearance: Normal appearance. She is well-developed.  HENT:     Head: Normocephalic and atraumatic.     Right Ear: External ear normal.     Left Ear: External ear normal.  Eyes:     General: No scleral icterus.       Right eye: No discharge.        Left eye: No discharge.     Conjunctiva/sclera: Conjunctivae normal.  Neck:     Trachea: No tracheal deviation.  Cardiovascular:     Rate and Rhythm: Normal rate.  Pulmonary:     Effort: Pulmonary effort is normal. No respiratory distress.  Breath sounds: No stridor.  Abdominal:     General: There is no distension.  Musculoskeletal:        General: Tenderness present. No swelling or deformity.     Cervical back: Neck supple.     Comments: Moderate TTP noted diffusely in the lumbar region.  No right hip pain appreciated.  Mild tenderness appreciated along the right patella.  Full range of motion of the right hip, knee, and ankle.  Skin:    General: Skin is warm and dry.     Findings: No rash.  Neurological:     General: No focal deficit present.     Mental Status: She is alert and oriented to person, place, and time.     Cranial Nerves: Cranial nerve deficit: no gross deficits.     Comments: Neurovascular intact in the bilateral lower extremities.  Distal sensation intact.  2+ pedal pulses.  Strength is 5/5 with plantar/dorsi flexion of the feet.   ED Results / Procedures / Treatments   Labs (all labs ordered are listed, but only abnormal results are displayed) Labs Reviewed - No data to display  EKG None  Radiology DG Lumbar Spine Complete  Result Date: 12/11/2020 CLINICAL DATA:  Low back pain. EXAM: LUMBAR SPINE - COMPLETE 4+ VIEW COMPARISON:  07/18/2020 FINDINGS: No fracture or bone lesion. Minimal anterolisthesis of L4 on L5 and L5 on S1, stable. No other spondylolisthesis. Discs are relatively well maintained in height. There are facet degenerative changes bilaterally at  L4-L5 and L5-S1 greatest on the left at L5-S1. Soft tissues are unremarkable. IMPRESSION: No fracture or acute finding. Degenerative changes stable compared to the prior study. Electronically Signed   By: Lajean Manes M.D.   On: 12/11/2020 15:24   DG Knee Complete 4 Views Right  Result Date: 12/11/2020 CLINICAL DATA:  Knee pain. EXAM: RIGHT KNEE - COMPLETE 4+ VIEW COMPARISON:  11/07/2020 FINDINGS: No fracture or bone lesion. Knee joint normally spaced and aligned. Minor marginal osteophytes from the superior and inferior patella. No other degenerative change. No joint effusion. Soft tissues are unremarkable. IMPRESSION: Minimal spurring from the inferior and superior patella. No other abnormality. Electronically Signed   By: Lajean Manes M.D.   On: 12/11/2020 15:22    Procedures Procedures   Medications Ordered in ED Medications  dexamethasone (DECADRON) injection 8 mg (has no administration in time range)    ED Course  I have reviewed the triage vital signs and the nursing notes.  Pertinent labs & imaging results that were available during my care of the patient were reviewed by me and considered in my medical decision making (see chart for details).    MDM Rules/Calculators/A&P                          Patient is a 65 year old female who presents to the emergency department with what appears to be acute on chronic low back and right knee pain.  Symptoms have been persistent for the past 3 years since having a right hip arthroplasty.  Physical exam significant for diffuse tenderness in the lumbar region as well as the right patella.  Full range of motion of the right hip, knee, and ankle.  Neurovascular intact in the lower extremities.  No numbness or weakness.  No red flags.  Will discharge on a course of Robaxin.  Given a dose of Decadron here in the emergency department.  Patient given a new referral to orthopedics at her  request.  Discussed return precautions.  Feel that she is  stable for discharge at this time and she is agreeable.  Her questions were answered and she was amicable at the time of discharge.  I discharged nursing staff notified me that patient's blood pressure was elevated.  She states that she ran out of her BP meds 2 days ago and cannot pick them up today.  We will give a dose of losartan as well as hydrochlorothiazide.   Blood pressure improving.  No chest pain or shortness of breath.  Feel the patient is stable for discharge at this time and she is agreeable.  Final Clinical Impression(s) / ED Diagnoses Final diagnoses:  Chronic pain of right knee   Rx / DC Orders ED Discharge Orders          Ordered    methocarbamol (ROBAXIN) 500 MG tablet  2 times daily PRN        12/11/20 1739             Rayna Sexton, PA-C 12/11/20 1750    Rayna Sexton, PA-C 12/11/20 1920    Gareth Morgan, MD 12/12/20 [phone]

## 2020-12-11 NOTE — Discharge Instructions (Addendum)
I am prescribing you a strong muscle relaxer called Robaxin.  You can take this up to 2 times a day for management of your pain.  This medication can be sedating so do not mix it with alcohol.  Do not drive a car after taking it.  Below is the contact information for Dr. Lyla Glassing.  He is a Research scientist (physical sciences).  Please give them a call and schedule a follow-up appointment.  If you develop new or worsening symptoms please come back to the emergency department.  It was a pleasure to meet you.

## 2020-12-14 ENCOUNTER — Ambulatory Visit (INDEPENDENT_AMBULATORY_CARE_PROVIDER_SITE_OTHER): Payer: Medicare Other | Admitting: Orthopaedic Surgery

## 2020-12-14 ENCOUNTER — Other Ambulatory Visit: Payer: Self-pay

## 2020-12-14 ENCOUNTER — Ambulatory Visit: Payer: Self-pay

## 2020-12-14 ENCOUNTER — Telehealth: Payer: Self-pay

## 2020-12-14 ENCOUNTER — Encounter: Payer: Self-pay | Admitting: Orthopaedic Surgery

## 2020-12-14 DIAGNOSIS — Z96641 Presence of right artificial hip joint: Secondary | ICD-10-CM | POA: Diagnosis not present

## 2020-12-14 MED ORDER — METHYLPREDNISOLONE 4 MG PO TABS: ORAL_TABLET | ORAL | 0 refills | Status: DC

## 2020-12-14 MED ORDER — METHOCARBAMOL 500 MG PO TABS: 500.0000 mg | ORAL_TABLET | Freq: Four times a day (QID) | ORAL | 1 refills | Status: DC | PRN

## 2020-12-14 MED ORDER — HYDROCODONE-ACETAMINOPHEN 5-325 MG PO TABS
1.0000 | ORAL_TABLET | Freq: Four times a day (QID) | ORAL | 0 refills | Status: DC | PRN
Start: 2020-12-14 — End: 2020-12-26

## 2020-12-14 NOTE — Progress Notes (Signed)
The patient is well-known to me.  She is a 65 year old female that had a right total hip arthroplasty done in 2019.  She has been to the emergency room recently due to significant low back pain but also right hip pain and thigh pain as well as right knee pain.  She says the pain radiates down to her ankle.  We have actually MRI of her lumbar spine earlier this year that showed significant facet arthritis at L5-S1 bilaterally.  There is also a central disc at L4-L5.  We also MRI her right knee this year just in early July showing an area of full-thickness cartilage loss on the weightbearing surface of the medial femoral condyle and a suspected meniscal tear but no true tear.  She is having significant pain all on the right side.  She denies any fever chills or nausea and vomiting.  On examination right hip she seems to have hip and thigh pain on the right side.  She also has right knee pain.  We have tried and failed conservative treatment for the knee including activity modification, steroid injections, quad strengthening exercises and anti-inflammatories.  She is only borderline diabetic.  She does not have a positive straight leg raise on the right side.  An AP pelvis and lateral of the right hip shows a total hip arthroplasty.  There is some slight lucency to the shoulder lateral shoulder of the come posterior to the femur suggesting loosening.  X-rays of her right knee from just 3 days ago when she was in the emergency room were negative.  Lumbar spine films are also negative for any acute findings.  At this point she is a perfect candidate for hyaluronic acid for her right knee to treat the pain from osteoarthritis given her MRI findings combined with the very conservative treatment.  I would like to start her on a steroid taper as well as some methocarbamol and occasional hydrocodone.  We will see her back in the next visit for hopefully placing hyaluronic acid into the right knee.  I may eventually  order a MRI of her right hip but I would await to see how she does with the hyaluronic acid injection and medications first.

## 2020-12-14 NOTE — Telephone Encounter (Signed)
Right knee gel injection  

## 2020-12-15 NOTE — Telephone Encounter (Signed)
Noted  

## 2020-12-20 ENCOUNTER — Telehealth: Payer: Self-pay | Admitting: Orthopaedic Surgery

## 2020-12-20 NOTE — Telephone Encounter (Signed)
Please advise 

## 2020-12-20 NOTE — Telephone Encounter (Signed)
Lvm informing pt.

## 2020-12-20 NOTE — Telephone Encounter (Signed)
Pt called stating she is waiting to get her gel inj approved but in the mean time she would like to know if she can get a refill of a steroid rx that lasted for six days ( pt not sure of name)? Pt would like a CB when this is ready.   [phone]

## 2020-12-22 ENCOUNTER — Encounter (INDEPENDENT_AMBULATORY_CARE_PROVIDER_SITE_OTHER): Payer: Self-pay

## 2020-12-26 ENCOUNTER — Telehealth: Payer: Self-pay | Admitting: Orthopaedic Surgery

## 2020-12-26 ENCOUNTER — Other Ambulatory Visit: Payer: Self-pay | Admitting: Orthopaedic Surgery

## 2020-12-26 MED ORDER — HYDROCODONE-ACETAMINOPHEN 5-325 MG PO TABS
1.0000 | ORAL_TABLET | Freq: Four times a day (QID) | ORAL | 0 refills | Status: DC | PRN
Start: 2020-12-26 — End: 2021-01-16

## 2020-12-26 NOTE — Telephone Encounter (Signed)
Please advise 

## 2020-12-26 NOTE — Telephone Encounter (Signed)
Pt calling to get a refill for her hydrocodone prescription. The pharmacy on file [facility]) is the best one and the best phone number is [phone].

## 2020-12-29 ENCOUNTER — Telehealth (INDEPENDENT_AMBULATORY_CARE_PROVIDER_SITE_OTHER): Payer: Self-pay

## 2020-12-29 DIAGNOSIS — E039 Hypothyroidism, unspecified: Secondary | ICD-10-CM

## 2020-12-29 MED ORDER — LEVOTHYROXINE SODIUM 75 MCG PO TABS: 75.0000 ug | ORAL_TABLET | Freq: Every day | ORAL | 0 refills | Status: DC

## 2020-12-29 NOTE — Telephone Encounter (Signed)
Patient called and left a detailed voice message requesting a refill of the following medication:  levothyroxine (SYNTHROID) 75 MCG tablet  Last filled 11/08/2020, # 90 with 0 refills

## 2020-12-29 NOTE — Telephone Encounter (Signed)
Refill approved.

## 2021-01-02 ENCOUNTER — Telehealth: Payer: Self-pay | Admitting: Orthopaedic Surgery

## 2021-01-02 NOTE — Telephone Encounter (Signed)
Patient called asked if the gel injection was approved yet? The number to contact patient is [phone]

## 2021-01-03 ENCOUNTER — Telehealth: Payer: Self-pay

## 2021-01-03 NOTE — Telephone Encounter (Signed)
Talked with patient concerning gel injection.  Advised patient that I will call her to schedule once I have an approval.  Patient voiced that she understands.

## 2021-01-03 NOTE — Telephone Encounter (Signed)
VOB submitted for Durolane, right knee. Pending BV.

## 2021-01-06 ENCOUNTER — Telehealth: Payer: Self-pay

## 2021-01-06 NOTE — Telephone Encounter (Signed)
Approved, Durolane, right knee. [place] Patient will be responsible for 20% OOP. No Co-pay No PA required  Appt. 01/12/2021 with Erskine Emery

## 2021-01-12 ENCOUNTER — Ambulatory Visit: Payer: Medicare Other | Admitting: Physician Assistant

## 2021-01-13 ENCOUNTER — Ambulatory Visit (INDEPENDENT_AMBULATORY_CARE_PROVIDER_SITE_OTHER): Payer: Medicare Other | Admitting: Family Medicine

## 2021-01-13 ENCOUNTER — Other Ambulatory Visit: Payer: Self-pay

## 2021-01-13 DIAGNOSIS — M1711 Unilateral primary osteoarthritis, right knee: Secondary | ICD-10-CM

## 2021-01-13 DIAGNOSIS — M25561 Pain in right knee: Secondary | ICD-10-CM

## 2021-01-13 DIAGNOSIS — G8929 Other chronic pain: Secondary | ICD-10-CM

## 2021-01-13 NOTE — Progress Notes (Signed)
Subjective: She is here for planned Durolane injection for right knee DJD.  Objective: Trace effusion with no warmth or erythema.  Procedure: Right knee injection: After sterile prep with Betadine, injected 3 cc 1% lidocaine without epinephrine and Durolane from lateral midpatellar approach.

## 2021-01-16 ENCOUNTER — Telehealth: Payer: Self-pay | Admitting: Orthopaedic Surgery

## 2021-01-16 ENCOUNTER — Other Ambulatory Visit: Payer: Self-pay | Admitting: Orthopaedic Surgery

## 2021-01-16 DIAGNOSIS — M25561 Pain in right knee: Secondary | ICD-10-CM

## 2021-01-16 DIAGNOSIS — M1711 Unilateral primary osteoarthritis, right knee: Secondary | ICD-10-CM

## 2021-01-16 DIAGNOSIS — G8929 Other chronic pain: Secondary | ICD-10-CM

## 2021-01-16 MED ORDER — HYDROCODONE-ACETAMINOPHEN 5-325 MG PO TABS: 1.0000 | ORAL_TABLET | Freq: Four times a day (QID) | ORAL | 0 refills | Status: DC | PRN

## 2021-01-16 NOTE — Telephone Encounter (Signed)
I called pt and discussed the need for more pain medication and how and why Dr. Ninfa Linden cant continue to give pain medication for a chronic pain. She stated understanding. I informed the pt we will put in a referral to pain management. She was ok with this plan. She is hoping the injection will work and she wont need anymore pain medication

## 2021-01-16 NOTE — Telephone Encounter (Signed)
Please advise 

## 2021-01-16 NOTE — Telephone Encounter (Signed)
Pt would like refill on hydrocodone.   CB [phone]

## 2021-01-16 NOTE — Addendum Note (Signed)
Addended by: Robyne Peers on: 01/16/2021 04:34 PM   Modules accepted: Orders

## 2021-01-23 ENCOUNTER — Encounter (INDEPENDENT_AMBULATORY_CARE_PROVIDER_SITE_OTHER): Payer: Medicare Other | Admitting: Internal Medicine

## 2021-01-31 ENCOUNTER — Encounter (INDEPENDENT_AMBULATORY_CARE_PROVIDER_SITE_OTHER): Payer: Medicare Other | Admitting: Internal Medicine

## 2021-02-28 ENCOUNTER — Encounter: Payer: Self-pay | Admitting: Internal Medicine

## 2021-02-28 ENCOUNTER — Ambulatory Visit (INDEPENDENT_AMBULATORY_CARE_PROVIDER_SITE_OTHER): Payer: Medicare Other | Admitting: Internal Medicine

## 2021-02-28 ENCOUNTER — Other Ambulatory Visit: Payer: Self-pay

## 2021-02-28 VITALS — BP 182/92 | HR 80 | Temp 98.0°F | Ht 66.0 in | Wt 205.2 lb

## 2021-02-28 DIAGNOSIS — Z23 Encounter for immunization: Secondary | ICD-10-CM

## 2021-02-28 DIAGNOSIS — E039 Hypothyroidism, unspecified: Secondary | ICD-10-CM

## 2021-02-28 DIAGNOSIS — Z7689 Persons encountering health services in other specified circumstances: Secondary | ICD-10-CM

## 2021-02-28 DIAGNOSIS — Z114 Encounter for screening for human immunodeficiency virus [HIV]: Secondary | ICD-10-CM

## 2021-02-28 DIAGNOSIS — I10 Essential (primary) hypertension: Secondary | ICD-10-CM

## 2021-02-28 DIAGNOSIS — E669 Obesity, unspecified: Secondary | ICD-10-CM

## 2021-02-28 DIAGNOSIS — E782 Mixed hyperlipidemia: Secondary | ICD-10-CM | POA: Diagnosis not present

## 2021-02-28 DIAGNOSIS — E66811 Obesity, class 1: Secondary | ICD-10-CM

## 2021-02-28 DIAGNOSIS — L80 Vitiligo: Secondary | ICD-10-CM

## 2021-02-28 DIAGNOSIS — E119 Type 2 diabetes mellitus without complications: Secondary | ICD-10-CM

## 2021-02-28 DIAGNOSIS — M1712 Unilateral primary osteoarthritis, left knee: Secondary | ICD-10-CM

## 2021-02-28 MED ORDER — LOSARTAN POTASSIUM-HCTZ 100-12.5 MG PO TABS: 1.0000 | ORAL_TABLET | Freq: Every day | ORAL | 0 refills | Status: DC

## 2021-02-28 MED ORDER — RYBELSUS 3 MG PO TABS: 3.0000 mg | ORAL_TABLET | Freq: Every day | ORAL | 0 refills | Status: DC

## 2021-02-28 NOTE — Assessment & Plan Note (Signed)
BP Readings from Last 1 Encounters:  02/28/21 (!) 182/92   Uncontrolled as she ran out of her Losartan and HCTZ, but would not be adequate considering her current and previous office visit BP at other providers Started Losartan-HCTZ 100-12.5 mg QD (increased dose)  Counseled for compliance with the medications Advised DASH diet and moderate exercise/walking, at least 150 mins/week

## 2021-02-28 NOTE — Assessment & Plan Note (Addendum)
Check lipid profile Plan to add statin due to h/o DM

## 2021-02-28 NOTE — Assessment & Plan Note (Signed)
Lab Results  Component Value Date   HGBA1C 7.4 (H) 10/13/2020   Diet-controlled Discussed about medication options, agrees to take Rybelsus due to weight loss benefit and it being PO Advised to follow diabetic diet On ARB F/u HbA1C, CMP and lipid panel Diabetic eye exam: Advised to follow up with Ophthalmology for diabetic eye exam

## 2021-02-28 NOTE — Assessment & Plan Note (Signed)
Care established Previous chart reviewed History and medications reviewed with the patient 

## 2021-02-28 NOTE — Assessment & Plan Note (Signed)
Lab Results  Component Value Date   TSH 1.17 10/13/2020   On levothyroxine 75 mcg daily Check TSH and free T4

## 2021-02-28 NOTE — Patient Instructions (Addendum)
Please start taking Rybelsus for diabetes.  Please start taking Losartan-HCTZ 100-12.5 mg once daily.  Please follow low carb diet and ambulate as tolerated.  Please get fasting blood tests done before the next visit.

## 2021-02-28 NOTE — Progress Notes (Signed)
Established Patient Office Visit  Subjective:  Patient ID: Jeanette Compton, female    DOB: [birth year]  Age: 65 y.o. MRN: [id number]  CC:  Chief Complaint  Patient presents with   New Patient (Initial Visit)    New patient was seeing Dr. Anastasio Champion    HPI Jeanette Compton is a 65 year old female with PMH of HTN, type II DM, hypothyroidism, OA of hip and knee, vitiligo and obesity who presents for establishing care.  HTN: Her BP was significantly elevated in the office today.  Of note, she has ran out of her losartan 50 mg and HCTZ 12.5 mg.  She has been having mild headache, but denies any dizziness, chest pain, dyspnea or palpitations currently.  Type II DM: Her HbA1c has been between 7.0-7.6.  She has not been placed on any antidiabetic medications yet.  She has been trying to control it with diet and weight loss.  She denies any fatigue, polyuria or polydipsia.  She has a history of OA of hip, s/p THA.  She also has a history of OA of knee, for which she has received Durolane injection.  She takes nabumetone as well.  She follows up with orthopedic surgeon.  She has had 2 doses of COVID-vaccine.  She received flu vaccine in the office today.  Past Medical History:  Diagnosis Date   Arthritis    knee , ankle and back   Bronchitis    Cataract    Diabetes mellitus without complication ([place]) 4/98/2641   Hyperlipidemia    Hypertension    Hypothyroidism, adult 02/10/2019   Neuralgia, post-herpetic 11/13/2016   Obesity (BMI 30.0-34.9) 02/10/2019   Thyroid disease    Vitamin D deficiency disease 02/10/2019    Past Surgical History:  Procedure Laterality Date   ABDOMINAL HYSTERECTOMY     fibroid   BREAST BIOPSY Right 07/31/2017   Procedure: EXCISIONAL BREAST BIOPSY WITH NEEDLE LOCALIZATION;  Surgeon: Virl Cagey, MD;  Location: [facility];  Service: General;  Laterality: Right;   BREAST SURGERY Right    COLONOSCOPY  09/13/2011   Procedure: COLONOSCOPY;  Surgeon: Rogene Houston, MD;   Location: [facility];  Service: Endoscopy;  Laterality: N/A;  1030   COLONOSCOPY N/A 07/31/2018   Procedure: COLONOSCOPY;  Surgeon: Rogene Houston, MD;  Location: [facility];  Service: Endoscopy;  Laterality: N/A;   POLYPECTOMY  07/31/2018   Procedure: POLYPECTOMY;  Surgeon: Rogene Houston, MD;  Location: [facility];  Service: Endoscopy;;   TOTAL HIP ARTHROPLASTY Right 12/13/2017   Procedure: RIGHT TOTAL HIP ARTHROPLASTY ANTERIOR APPROACH;  Surgeon: Mcarthur Rossetti, MD;  Location: [facility];  Service: Orthopedics;  Laterality: Right;    Family History  Problem Relation Age of Onset   Stroke Mother    Diabetes Mother    Alcohol abuse Father    Early death Father    Early death Sister        pneumonia   Cancer Brother    Alzheimer's disease Sister    COPD Brother    Colon cancer Neg Hx     Social History   Socioeconomic History   Marital status: Single    Spouse name: Not on file   Number of children: 3   Years of education: 10   Highest education level: Not on file  Occupational History   Occupation: disability  Tobacco Use   Smoking status: Former    Packs/day: 0.50    Years: 3.00  Pack years: 1.50    Types: Cigarettes    Quit date: 53    Years since quitting: 41.8   Smokeless tobacco: Never  Vaping Use   Vaping Use: Never used  Substance and Sexual Activity   Alcohol use: No   Drug use: Yes    Types: Marijuana   Sexual activity: Never    Birth control/protection: Surgical  Other Topics Concern   Not on file  Social History Narrative      Comments: Divorced x 3. Single. Has 3 children (2 boys and 1 girl), 9 grandchildren, 1 great-grandchild. Disability from arthritis in back. 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      Social Determinants of Health   Financial Resource Strain: Not on file  Food Insecurity: Not on file  Transportation Needs: Not on file  Physical Activity: Not on file   Stress: Not on file  Social Connections: Not on file  Intimate Partner Violence: Not on file    Outpatient Medications Prior to Visit  Medication Sig Dispense Refill   Cholecalciferol (VITAMIN D3) 125 MCG (5000 UT) CAPS Take 2 capsules by mouth daily.      levothyroxine (SYNTHROID) 75 MCG tablet Take 1 tablet (75 mcg total) by mouth daily. 90 tablet 0   Multiple Vitamins-Minerals (HAIR SKIN AND NAILS FORMULA) TABS Take 2 tablets by mouth daily.     nabumetone (RELAFEN) 750 MG tablet TAKE (1) TABLET BY MOUTH TWICE DAILY AS NEEDED. 60 tablet 0   hydrochlorothiazide (MICROZIDE) 12.5 MG capsule TAKE 1 CAPSULE BY MOUTH ONCE A DAY. 90 capsule 0   losartan (COZAAR) 50 MG tablet TAKE (1) TABLET BY MOUTH ONCE DAILY. 90 tablet 0   acetaminophen-codeine (TYLENOL #3) 300-30 MG tablet Take 1-2 tablets by mouth every 8 (eight) hours as needed. (Patient not taking: Reported on 12/05/2020) 30 tablet 0   HYDROcodone-acetaminophen (NORCO/VICODIN) 5-325 MG tablet Take 1 tablet by mouth every 6 (six) hours as needed for moderate pain. 30 tablet 0   liothyronine (CYTOMEL) 5 MCG tablet TAKE ONE TABLET BY MOUTH ONCE DAILY. 30 tablet 0   methocarbamol (ROBAXIN) 500 MG tablet Take 1 tablet (500 mg total) by mouth every 6 (six) hours as needed for muscle spasms. 60 tablet 1   methylPREDNISolone (MEDROL) 4 MG tablet Medrol dose pack. Take as instructed 21 tablet 0   Multiple Vitamin (MULTIVITAMIN WITH MINERALS) TABS tablet Take 1 tablet by mouth daily. One-A-Day     traMADol (ULTRAM) 50 MG tablet Take 2 tablets (100 mg total) by mouth every 6 (six) hours as needed. 30 tablet 0   No facility-administered medications prior to visit.    Allergies  Allergen Reactions   Lisinopril Swelling and Other (See Comments)    Lips swelled    ROS Review of Systems  Constitutional:  Negative for chills and fever.  HENT:  Negative for congestion, sinus pressure, sinus pain and sore throat.   Eyes:  Negative for pain and  discharge.  Respiratory:  Negative for cough and shortness of breath.   Cardiovascular:  Negative for chest pain and palpitations.  Gastrointestinal:  Negative for abdominal pain, constipation, diarrhea, nausea and vomiting.  Endocrine: Negative for polydipsia and polyuria.  Genitourinary:  Negative for dysuria and hematuria.  Musculoskeletal:  Positive for arthralgias (R hip and knee). Negative for neck pain and neck stiffness.  Skin:  Negative for rash.  Neurological:  Negative for dizziness and weakness.  Psychiatric/Behavioral:  Negative  for agitation and behavioral problems.      Objective:    Physical Exam Vitals reviewed.  Constitutional:      General: She is not in acute distress.    Appearance: She is not diaphoretic.  HENT:     Head: Normocephalic and atraumatic.     Nose: Nose normal. No congestion.     Mouth/Throat:     Mouth: Mucous membranes are moist.     Pharynx: No posterior oropharyngeal erythema.  Eyes:     General: No scleral icterus.    Extraocular Movements: Extraocular movements intact.  Cardiovascular:     Rate and Rhythm: Normal rate and regular rhythm.     Pulses: Normal pulses.     Heart sounds: Normal heart sounds. No murmur heard. Pulmonary:     Breath sounds: Normal breath sounds. No wheezing or rales.  Abdominal:     Palpations: Abdomen is soft.     Tenderness: There is no abdominal tenderness.  Musculoskeletal:     Cervical back: Neck supple. No tenderness.     Right lower leg: No edema.     Left lower leg: No edema.  Skin:    General: Skin is warm.     Findings: No rash.  Neurological:     General: No focal deficit present.     Mental Status: She is alert and oriented to person, place, and time.     Sensory: No sensory deficit.     Motor: No weakness.  Psychiatric:        Mood and Affect: Mood normal.        Behavior: Behavior normal.    BP (!) 182/92 (BP Location: Left Arm, Cuff Size: Normal)   Pulse 80   Temp 98 F (36.7 C)  (Oral)   Ht _0  (1.676 m)   Wt 205 lb 2.4 oz (93.1 kg)   SpO2 95%   BMI 33.11 kg/m  Wt Readings from Last 3 Encounters:  02/28/21 205 lb 2.4 oz (93.1 kg)  10/13/20 206 lb (93.4 kg)  07/18/20 210 lb (95.3 kg)     Health Maintenance Due  Topic Date Due   Zoster Vaccines- Shingrix (1 of 2) Never done   OPHTHALMOLOGY EXAM  02/10/2019   TETANUS/TDAP  05/22/2019   COVID-19 Vaccine (3 - Mixed Product risk series) 12/17/2019   FOOT EXAM  06/08/2020    There are no preventive care reminders to display for this patient.  Lab Results  Component Value Date   TSH 1.17 10/13/2020   Lab Results  Component Value Date   WBC 12.8 (H) 12/14/2017   HGB 11.7 (L) 12/14/2017   HCT 36.8 12/14/2017   MCV 87.4 12/14/2017   PLT 300 12/14/2017   Lab Results  Component Value Date   NA 141 10/13/2020   K 4.2 10/13/2020   CO2 28 10/13/2020   GLUCOSE 140 (H) 10/13/2020   BUN 16 10/13/2020   CREATININE 0.73 10/13/2020   BILITOT 0.3 06/22/2020   ALKPHOS 83 09/10/2016   AST 13 06/22/2020   ALT 13 06/22/2020   PROT 7.7 06/22/2020   ALBUMIN 4.2 09/10/2016   CALCIUM 9.9 10/13/2020   ANIONGAP 10 12/14/2017   Lab Results  Component Value Date   CHOL 217 (H) 02/10/2019   Lab Results  Component Value Date   HDL 50 02/10/2019   Lab Results  Component Value Date   LDLCALC 139 (H) 02/10/2019   Lab Results  Component Value Date   TRIG 152 (H)  02/10/2019   Lab Results  Component Value Date   CHOLHDL 4.3 02/10/2019   Lab Results  Component Value Date   HGBA1C 7.4 (H) 10/13/2020      Assessment & Plan:   Problem List Items Addressed This Visit       Encounter to establish care - Primary   Care established Previous chart reviewed History and medications reviewed with the patient     Relevant Orders  CBC with Differential/Platelet    Cardiovascular and Mediastinum   Essential hypertension    BP Readings from Last 1 Encounters:  02/28/21 (!) 182/92  Uncontrolled as she  ran out of her Losartan and HCTZ, but would not be adequate considering her current and previous office visit BP at other providers Started Losartan-HCTZ 100-12.5 mg QD (increased dose)  Counseled for compliance with the medications Advised DASH diet and moderate exercise/walking, at least 150 mins/week      Relevant Medications   losartan-hydrochlorothiazide (HYZAAR) 100-12.5 MG tablet   Other Relevant Orders   CBC with Differential/Platelet   CMP14+EGFR     Endocrine   Hypothyroidism, adult    Lab Results  Component Value Date   TSH 1.17 10/13/2020  On levothyroxine 75 mcg daily Check TSH and free T4      Relevant Orders   TSH + free T4   Diabetes mellitus without complication ([place])    Lab Results  Component Value Date   HGBA1C 7.4 (H) 10/13/2020  Diet-controlled Discussed about medication options, agrees to take Rybelsus due to weight loss benefit and it being PO Advised to follow diabetic diet On ARB F/u HbA1C, CMP and lipid panel Diabetic eye exam: Advised to follow up with Ophthalmology for diabetic eye exam       Relevant Medications   Semaglutide (RYBELSUS) 3 MG TABS   losartan-hydrochlorothiazide (HYZAAR) 100-12.5 MG tablet   Other Relevant Orders   Lipid panel   Hemoglobin A1c     Musculoskeletal and Integument   Primary vitiligo    Benign, observe for now      Osteoarthritis of knee    Followed by orthopedic surgeon Has had Durolane injection Takes nabumetone        Other   HLD (hyperlipidemia)    Check lipid profile Plan to add statin due to h/o DM      Relevant Medications   losartan-hydrochlorothiazide (HYZAAR) 100-12.5 MG tablet   Obesity (BMI 30.0-34.9)    Diet modification advised Started GLP-1 agonist for DM      Other Visit Diagnoses     Encounter for screening for HIV       Relevant Orders   HIV antibody (with reflex)   Need for immunization against influenza       Relevant Orders   Flu Vaccine QUAD 45moIM (Fluarix,  Fluzone & Alfiuria Quad PF) (Completed)       Meds ordered this encounter  Medications   Semaglutide (RYBELSUS) 3 MG TABS    Sig: Take 3 mg by mouth daily.    Dispense:  30 tablet    Refill:  0   losartan-hydrochlorothiazide (HYZAAR) 100-12.5 MG tablet    Sig: Take 1 tablet by mouth daily.    Dispense:  90 tablet    Refill:  0    Follow-up: Return in about 6 weeks (around 04/11/2021) for HTN.    RLindell Spar MD

## 2021-02-28 NOTE — Assessment & Plan Note (Signed)
Benign, observe for now

## 2021-02-28 NOTE — Assessment & Plan Note (Signed)
Diet modification advised Started GLP-1 agonist for DM

## 2021-02-28 NOTE — Assessment & Plan Note (Addendum)
Followed by orthopedic surgeon Has had Durolane injection Takes nabumetone

## 2021-03-22 ENCOUNTER — Other Ambulatory Visit: Payer: Self-pay | Admitting: *Deleted

## 2021-03-22 ENCOUNTER — Ambulatory Visit (INDEPENDENT_AMBULATORY_CARE_PROVIDER_SITE_OTHER): Payer: Medicare Other

## 2021-03-22 ENCOUNTER — Other Ambulatory Visit: Payer: Self-pay

## 2021-03-22 DIAGNOSIS — E039 Hypothyroidism, unspecified: Secondary | ICD-10-CM

## 2021-03-22 DIAGNOSIS — Z0000 Encounter for general adult medical examination without abnormal findings: Secondary | ICD-10-CM

## 2021-03-22 MED ORDER — LEVOTHYROXINE SODIUM 75 MCG PO TABS: 75.0000 ug | ORAL_TABLET | Freq: Every day | ORAL | 0 refills | Status: DC

## 2021-03-22 NOTE — Progress Notes (Signed)
Subjective:   Jeanette Compton is a 65 y.o. female who presents for Medicare Annual (Subsequent) preventive examination.   I connected with  Jeanette Compton on 03/22/21 by a audio enabled telemedicine application and verified that I am speaking with the correct person using two identifiers.   I discussed the limitations, risks, security and privacy concerns of performing an evaluation and management service by telephone and the availability of in person appointments. I also discussed with the patient that there may be a patient responsible charge related to this service. The patient expressed understanding and verbally consented to this telephonic visit.   Review of Systems           Objective:    There were no vitals filed for this visit. There is no height or weight on file to calculate BMI.  Advanced Directives 07/31/2018 12/13/2017 12/10/2017 07/31/2017 07/26/2017 06/03/2017 05/19/2017  Does Patient Have a Medical Advance Directive? No No No No No No No  Would patient like information on creating a medical advance directive? No - Patient declined No - Patient declined No - Patient declined No - Patient declined No - Patient declined - -  Pre-existing out of facility DNR order (yellow form or pink MOST form) - - - - - - -    Current Medications (verified) Outpatient Encounter Medications as of 03/22/2021  Medication Sig   Cholecalciferol (VITAMIN D3) 125 MCG (5000 UT) CAPS Take 2 capsules by mouth daily.    levothyroxine (SYNTHROID) 75 MCG tablet Take 1 tablet (75 mcg total) by mouth daily.   losartan-hydrochlorothiazide (HYZAAR) 100-12.5 MG tablet Take 1 tablet by mouth daily.   Multiple Vitamins-Minerals (HAIR SKIN AND NAILS FORMULA) TABS Take 2 tablets by mouth daily.   nabumetone (RELAFEN) 750 MG tablet TAKE (1) TABLET BY MOUTH TWICE DAILY AS NEEDED.   Semaglutide (RYBELSUS) 3 MG TABS Take 3 mg by mouth daily.   No facility-administered encounter medications on file as of  03/22/2021.    Allergies (verified) Lisinopril   History: Past Medical History:  Diagnosis Date   Arthritis    knee , ankle and back   Bronchitis    Cataract    Diabetes mellitus without complication ([place]) 1/66/0630   Hyperlipidemia    Hypertension    Hypothyroidism, adult 02/10/2019   Neuralgia, post-herpetic 11/13/2016   Obesity (BMI 30.0-34.9) 02/10/2019   Thyroid disease    Vitamin D deficiency disease 02/10/2019   Past Surgical History:  Procedure Laterality Date   ABDOMINAL HYSTERECTOMY     fibroid   BREAST BIOPSY Right 07/31/2017   Procedure: EXCISIONAL BREAST BIOPSY WITH NEEDLE LOCALIZATION;  Surgeon: Virl Cagey, MD;  Location: [facility];  Service: General;  Laterality: Right;   BREAST SURGERY Right    COLONOSCOPY  09/13/2011   Procedure: COLONOSCOPY;  Surgeon: Rogene Houston, MD;  Location: [facility];  Service: Endoscopy;  Laterality: N/A;  1030   COLONOSCOPY N/A 07/31/2018   Procedure: COLONOSCOPY;  Surgeon: Rogene Houston, MD;  Location: [facility];  Service: Endoscopy;  Laterality: N/A;   POLYPECTOMY  07/31/2018   Procedure: POLYPECTOMY;  Surgeon: Rogene Houston, MD;  Location: [facility];  Service: Endoscopy;;   TOTAL HIP ARTHROPLASTY Right 12/13/2017   Procedure: RIGHT TOTAL HIP ARTHROPLASTY ANTERIOR APPROACH;  Surgeon: Mcarthur Rossetti, MD;  Location: [facility];  Service: Orthopedics;  Laterality: Right;   Family History  Problem Relation Age of Onset   Stroke Mother    Diabetes  Mother    Alcohol abuse Father    Early death Father    Early death Sister        pneumonia   Cancer Brother    Alzheimer's disease Sister    COPD Brother    Colon cancer Neg Hx    Social History   Socioeconomic History   Marital status: Single    Spouse name: Not on file   Number of children: 3   Years of education: 10   Highest education level: Not on file  Occupational History   Occupation: disability  Tobacco Use   Smoking status: Former     Packs/day: 0.50    Years: 3.00    Pack years: 1.50    Types: Cigarettes    Quit date: 1981    Years since quitting: 41.8   Smokeless tobacco: Never  Vaping Use   Vaping Use: Never used  Substance and Sexual Activity   Alcohol use: No   Drug use: Yes    Types: Marijuana   Sexual activity: Never    Birth control/protection: Surgical  Other Topics Concern   Not on file  Social History Narrative      Comments: Divorced x 3. Single. Has 3 children (2 boys and 1 girl), 9 grandchildren, 1 great-grandchild. Disability from arthritis in back. 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      Social Determinants of Health   Financial Resource Strain: Not on file  Food Insecurity: Not on file  Transportation Needs: Not on file  Physical Activity: Not on file  Stress: Not on file  Social Connections: Not on file    Tobacco Counseling Counseling given: Not Answered   Clinical Intake:                 Diabetic?Yes         Activities of Daily Living No flowsheet data found.  Patient Care Team: Lindell Spar, MD as PCP - General (Internal Medicine)  Indicate any recent Medical Services you may have received from other than [facility] providers in the past year (date may be approximate).     Assessment:   This is a routine wellness examination for [name].  Hearing/Vision screen No results found.  Dietary issues and exercise activities discussed:     Goals Addressed   None   Depression Screen PHQ 2/9 Scores 02/28/2021 06/22/2020 04/11/2020 06/09/2019 04/10/2017 03/12/2017 11/13/2016  PHQ - 2 Score 0 0 0 0 0 0 0  PHQ- 9 Score - 0 0 - - - -    Fall Risk Fall Risk  02/28/2021 04/10/2017 11/13/2016 09/10/2016 03/15/2014  Falls in the past year? 0 No No No No  Number falls in past yr: 0 - - - -  Injury with Fall? 0 - - - -  Risk for fall due to : No Fall Risks - - - -  Follow up Falls evaluation completed;Education  provided;Falls prevention discussed - - - -    FALL RISK PREVENTION PERTAINING TO THE HOME:  Any stairs in or around the home? No  If so, are there any without handrails? No  Home free of loose throw rugs in walkways, pet beds, electrical cords, etc? Yes  Adequate lighting in your home to reduce risk of falls? Yes   ASSISTIVE DEVICES UTILIZED TO PREVENT FALLS:  Life alert? No  Use of a cane, walker or w/c? Yes  Grab bars in the bathroom? No  Shower chair or bench in shower? No  Elevated toilet seat or a handicapped toilet? Yes   TIMED UP AND GO:  Was the test performed? No .  Length of time to ambulate 10 feet: n/a sec.     Cognitive Function:     6CIT Screen 04/10/2017  What Year? 0 points  What month? 0 points  What time? 0 points  Count back from 20 0 points  Months in reverse 0 points  Repeat phrase 2 points  Total Score 2    Immunizations Immunization History  Administered Date(s) Administered   Influenza,inj,Quad PF,6+ Mos 03/12/2017, 03/10/2019, 04/11/2020, 02/28/2021   Moderna Sars-Covid-2 Vaccination 11/18/2019, 11/19/2019   Pneumococcal Conjugate-13 03/10/2019   Pneumococcal Polysaccharide-23 04/11/2020   Td 05/21/2009    TDAP status: Due, Education has been provided regarding the importance of this vaccine. Advised may receive this vaccine at local pharmacy or Health Dept. Aware to provide a copy of the vaccination record if obtained from local pharmacy or Health Dept. Verbalized acceptance and understanding.  Flu Vaccine status: Up to date  Pneumococcal vaccine status: Up to date  Covid-19 vaccine status: Completed vaccines  Qualifies for Shingles Vaccine? Yes   Zostavax completed Yes   Shingrix Completed?: No.    Education has been provided regarding the importance of this vaccine. Patient has been advised to call insurance company to determine out of pocket expense if they have not yet received this vaccine. Advised may also receive vaccine at  local pharmacy or Health Dept. Verbalized acceptance and understanding.  Screening Tests Health Maintenance  Topic Date Due   Zoster Vaccines- Shingrix (1 of 2) Never done   OPHTHALMOLOGY EXAM  02/10/2019   TETANUS/TDAP  05/22/2019   COVID-19 Vaccine (3 - Mixed Product risk series) 12/17/2019   FOOT EXAM  06/08/2020   HEMOGLOBIN A1C  04/15/2021   MAMMOGRAM  08/02/2022   COLONOSCOPY (Pts 45-75yrs Insurance coverage will need to be confirmed)  07/31/2023   Pneumococcal Vaccine 56-30 Years old (3 - PPSV23 if available, else PCV20) 04/11/2025   INFLUENZA VACCINE  Completed   Hepatitis C Screening  Completed   HIV Screening  Completed   HPV VACCINES  Aged Out   PAP SMEAR-Modifier  Discontinued    Health Maintenance  Health Maintenance Due  Topic Date Due   Zoster Vaccines- Shingrix (1 of 2) Never done   OPHTHALMOLOGY EXAM  02/10/2019   TETANUS/TDAP  05/22/2019   COVID-19 Vaccine (3 - Mixed Product risk series) 12/17/2019   FOOT EXAM  06/08/2020    Colorectal cancer screening: Type of screening: Colonoscopy. Completed 07/31/2018. Repeat every 5 years  Mammogram status: Completed 08/01/2020. Repeat every year  Bone Density status: Ordered  . Pt provided with contact info and advised to call to schedule appt.  Lung Cancer Screening: (Low Dose CT Chest recommended if Age 49-80 years, 30 pack-year currently smoking OR have quit w/in 15years.) does not qualify.   Lung Cancer Screening Referral: No  Additional Screening:  Hepatitis C Screening: does qualify; Completed 02/10/2019  Vision Screening: Recommended annual ophthalmology exams for early detection of glaucoma and other disorders of the eye. Is the patient up to date with their annual eye exam?  No  Who is the provider or what is the name of the office in which the patient attends annual eye exams? Appt nov 2022 If pt is not established with a provider, would they like to be referred to a provider to establish care? No .  Dental Screening: Recommended annual dental exams for proper oral hygiene  Community Resource Referral / Chronic Care Management: CRR required this visit?  No   CCM required this visit?  No      Plan:     I have personally reviewed and noted the following in the patient's chart:   Medical and social history Use of alcohol, tobacco or illicit drugs  Current medications and supplements including opioid prescriptions.  Functional ability and status Nutritional status Physical activity Advanced directives List of other physicians Hospitalizations, surgeries, and ER visits in previous 12 months Vitals Screenings to include cognitive, depression, and falls Referrals and appointments  In addition, I have reviewed and discussed with patient certain preventive protocols, quality metrics, and best practice recommendations. A written personalized care plan for preventive services as well as general preventive health recommendations were provided to patient.     Quentin Angst, [place]   03/22/2021   Nurse Notes: This is a tele health visit with patient at home.The provider is in the office and is [employer]

## 2021-03-22 NOTE — Patient Instructions (Signed)
[name] , Thank you for taking time to come for your Medicare Wellness Visit. I appreciate your ongoing commitment to your health goals. Please review the following plan we discussed and let me know if I can assist you in the future.   Screening recommendations/referrals: Colonoscopy: complete Mammogram: complete Bone Density: due now     Recommended yearly ophthalmology/optometry visit for glaucoma screening and checkup Recommended yearly dental visit for hygiene and checkup  Vaccinations: Influenza vaccine: complete Pneumococcal vaccine: complete Tdap vaccine: due now  Shingles vaccine: due now     Advanced directives: patient declined  Conditions/risks identified: hypertension  Next appointment: 1 year    Preventive Care 64 Years and Older, Female Preventive care refers to lifestyle choices and visits with your health care provider that can promote health and wellness. What does preventive care include? A yearly physical exam. This is also called an annual well check. Dental exams once or twice a year. Routine eye exams. Ask your health care provider how often you should have your eyes checked. Personal lifestyle choices, including: Daily care of your teeth and gums. Regular physical activity. Eating a healthy diet. Avoiding tobacco and drug use. Limiting alcohol use. Practicing safe sex. Taking low-dose aspirin every day. Taking vitamin and mineral supplements as recommended by your health care provider. What happens during an annual well check? The services and screenings done by your health care provider during your annual well check will depend on your age, overall health, lifestyle risk factors, and family history of disease. Counseling  Your health care provider may ask you questions about your: Alcohol use. Tobacco use. Drug use. Emotional well-being. Home and relationship well-being. Sexual activity. Eating habits. History of falls. Memory and ability to  understand (cognition). Work and work Statistician. Reproductive health. Screening  You may have the following tests or measurements: Height, weight, and BMI. Blood pressure. Lipid and cholesterol levels. These may be checked every 5 years, or more frequently if you are over 37 years old. Skin check. Lung cancer screening. You may have this screening every year starting at age 43 if you have a 30-pack-year history of smoking and currently smoke or have quit within the past 15 years. Fecal occult blood test (FOBT) of the stool. You may have this test every year starting at age 45. Flexible sigmoidoscopy or colonoscopy. You may have a sigmoidoscopy every 5 years or a colonoscopy every 10 years starting at age 55. Hepatitis C blood test. Hepatitis B blood test. Sexually transmitted disease (STD) testing. Diabetes screening. This is done by checking your blood sugar (glucose) after you have not eaten for a while (fasting). You may have this done every 1-3 years. Bone density scan. This is done to screen for osteoporosis. You may have this done starting at age 32. Mammogram. This may be done every 1-2 years. Talk to your health care provider about how often you should have regular mammograms. Talk with your health care provider about your test results, treatment options, and if necessary, the need for more tests. Vaccines  Your health care provider may recommend certain vaccines, such as: Influenza vaccine. This is recommended every year. Tetanus, diphtheria, and acellular pertussis (Tdap, Td) vaccine. You may need a Td booster every 10 years. Zoster vaccine. You may need this after age 48. Pneumococcal 13-valent conjugate (PCV13) vaccine. One dose is recommended after age 91. Pneumococcal polysaccharide (PPSV23) vaccine. One dose is recommended after age 61. Talk to your health care provider about which screenings and vaccines  you need and how often you need them. This information is not  intended to replace advice given to you by your health care provider. Make sure you discuss any questions you have with your health care provider. Document Released: 06/03/2015 Document Revised: 01/25/2016 Document Reviewed: 03/08/2015 Elsevier Interactive Patient Education  2017 [place] Prevention in the Home Falls can cause injuries. They can happen to people of all ages. There are many things you can do to make your home safe and to help prevent falls. What can I do on the outside of my home? Regularly fix the edges of walkways and driveways and fix any cracks. Remove anything that might make you trip as you walk through a door, such as a raised step or threshold. Trim any bushes or trees on the path to your home. Use bright outdoor lighting. Clear any walking paths of anything that might make someone trip, such as rocks or tools. Regularly check to see if handrails are loose or broken. Make sure that both sides of any steps have handrails. Any raised decks and porches should have guardrails on the edges. Have any leaves, snow, or ice cleared regularly. Use sand or salt on walking paths during winter. Clean up any spills in your garage right away. This includes oil or grease spills. What can I do in the bathroom? Use night lights. Install grab bars by the toilet and in the tub and shower. Do not use towel bars as grab bars. Use non-skid mats or decals in the tub or shower. If you need to sit down in the shower, use a plastic, non-slip stool. Keep the floor dry. Clean up any water that spills on the floor as soon as it happens. Remove soap buildup in the tub or shower regularly. Attach bath mats securely with double-sided non-slip rug tape. Do not have throw rugs and other things on the floor that can make you trip. What can I do in the bedroom? Use night lights. Make sure that you have a light by your bed that is easy to reach. Do not use any sheets or blankets that are  too big for your bed. They should not hang down onto the floor. Have a firm chair that has side arms. You can use this for support while you get dressed. Do not have throw rugs and other things on the floor that can make you trip. What can I do in the kitchen? Clean up any spills right away. Avoid walking on wet floors. Keep items that you use a lot in easy-to-reach places. If you need to reach something above you, use a strong step stool that has a grab bar. Keep electrical cords out of the way. Do not use floor polish or wax that makes floors slippery. If you must use wax, use non-skid floor wax. Do not have throw rugs and other things on the floor that can make you trip. What can I do with my stairs? Do not leave any items on the stairs. Make sure that there are handrails on both sides of the stairs and use them. Fix handrails that are broken or loose. Make sure that handrails are as long as the stairways. Check any carpeting to make sure that it is firmly attached to the stairs. Fix any carpet that is loose or worn. Avoid having throw rugs at the top or bottom of the stairs. If you do have throw rugs, attach them to the floor with carpet tape. Make sure  that you have a light switch at the top of the stairs and the bottom of the stairs. If you do not have them, ask someone to add them for you. What else can I do to help prevent falls? Wear shoes that: Do not have high heels. Have rubber bottoms. Are comfortable and fit you well. Are closed at the toe. Do not wear sandals. If you use a stepladder: Make sure that it is fully opened. Do not climb a closed stepladder. Make sure that both sides of the stepladder are locked into place. Ask someone to hold it for you, if possible. Clearly mark and make sure that you can see: Any grab bars or handrails. First and last steps. Where the edge of each step is. Use tools that help you move around (mobility aids) if they are needed. These  include: Canes. Walkers. Scooters. Crutches. Turn on the lights when you go into a dark area. Replace any light bulbs as soon as they burn out. Set up your furniture so you have a clear path. Avoid moving your furniture around. If any of your floors are uneven, fix them. If there are any pets around you, be aware of where they are. Review your medicines with your doctor. Some medicines can make you feel dizzy. This can increase your chance of falling. Ask your doctor what other things that you can do to help prevent falls. This information is not intended to replace advice given to you by your health care provider. Make sure you discuss any questions you have with your health care provider. Document Released: 03/03/2009 Document Revised: 10/13/2015 Document Reviewed: 06/11/2014 Elsevier Interactive Patient Education  2017 [employer].

## 2021-04-03 ENCOUNTER — Other Ambulatory Visit: Payer: Self-pay | Admitting: *Deleted

## 2021-04-03 ENCOUNTER — Telehealth: Payer: Self-pay | Admitting: Internal Medicine

## 2021-04-03 DIAGNOSIS — G8929 Other chronic pain: Secondary | ICD-10-CM | POA: Diagnosis not present

## 2021-04-03 DIAGNOSIS — E119 Type 2 diabetes mellitus without complications: Secondary | ICD-10-CM

## 2021-04-03 DIAGNOSIS — M47816 Spondylosis without myelopathy or radiculopathy, lumbar region: Secondary | ICD-10-CM | POA: Diagnosis not present

## 2021-04-03 DIAGNOSIS — M5441 Lumbago with sciatica, right side: Secondary | ICD-10-CM | POA: Diagnosis not present

## 2021-04-03 MED ORDER — RYBELSUS 3 MG PO TABS: 3.0000 mg | ORAL_TABLET | Freq: Every day | ORAL | 0 refills | Status: DC

## 2021-04-03 NOTE — Telephone Encounter (Signed)
Pt medication sent to pharmacy  

## 2021-04-03 NOTE — Telephone Encounter (Signed)
Pt called for refill on   RYBELSUS

## 2021-04-05 DIAGNOSIS — I10 Essential (primary) hypertension: Secondary | ICD-10-CM | POA: Diagnosis not present

## 2021-04-05 DIAGNOSIS — Z7689 Persons encountering health services in other specified circumstances: Secondary | ICD-10-CM | POA: Diagnosis not present

## 2021-04-05 DIAGNOSIS — E039 Hypothyroidism, unspecified: Secondary | ICD-10-CM | POA: Diagnosis not present

## 2021-04-05 DIAGNOSIS — E119 Type 2 diabetes mellitus without complications: Secondary | ICD-10-CM | POA: Diagnosis not present

## 2021-04-06 LAB — CMP14+EGFR
ALT: 10 IU/L (ref 0–32)
AST: 13 IU/L (ref 0–40)
Albumin/Globulin Ratio: 1.5 (ref 1.2–2.2)
Albumin: 4.7 g/dL (ref 3.8–4.8)
Alkaline Phosphatase: 94 IU/L (ref 44–121)
BUN/Creatinine Ratio: 19 (ref 12–28)
BUN: 14 mg/dL (ref 8–27)
Bilirubin Total: 0.4 mg/dL (ref 0.0–1.2)
CO2: 25 mmol/L (ref 20–29)
Calcium: 10 mg/dL (ref 8.7–10.3)
Chloride: 102 mmol/L (ref 96–106)
Creatinine, Ser: 0.73 mg/dL (ref 0.57–1.00)
Globulin, Total: 3.1 g/dL (ref 1.5–4.5)
Glucose: 107 mg/dL — ABNORMAL HIGH (ref 70–99)
Potassium: 4.6 mmol/L (ref 3.5–5.2)
Sodium: 141 mmol/L (ref 134–144)
Total Protein: 7.8 g/dL (ref 6.0–8.5)
eGFR: 92 mL/min/{1.73_m2} (ref >59)

## 2021-04-06 LAB — CBC WITH DIFFERENTIAL/PLATELET
Basophils Absolute: 0.1 10*3/uL (ref 0.0–0.2)
Basos: 1 %
EOS (ABSOLUTE): 0.1 10*3/uL (ref 0.0–0.4)
Eos: 2 %
Hematocrit: 38.6 % (ref 34.0–46.6)
Hemoglobin: 12.9 g/dL (ref 11.1–15.9)
Immature Grans (Abs): 0 10*3/uL (ref 0.0–0.1)
Immature Granulocytes: 0 %
Lymphocytes Absolute: 2.7 10*3/uL (ref 0.7–3.1)
Lymphs: 42 %
MCH: 27.5 pg (ref 26.6–33.0)
MCHC: 33.4 g/dL (ref 31.5–35.7)
MCV: 82 fL (ref 79–97)
Monocytes Absolute: 0.5 10*3/uL (ref 0.1–0.9)
Monocytes: 8 %
Neutrophils Absolute: 3.1 10*3/uL (ref 1.4–7.0)
Neutrophils: 47 %
Platelets: 345 10*3/uL (ref 150–450)
RBC: 4.69 x10E6/uL (ref 3.77–5.28)
RDW: 12.2 % (ref 11.7–15.4)
WBC: 6.4 10*3/uL (ref 3.4–10.8)

## 2021-04-06 LAB — TSH+FREE T4
Free T4: 1.06 ng/dL (ref 0.82–1.77)
TSH: 0.915 u[IU]/mL (ref 0.450–4.500)

## 2021-04-06 LAB — LIPID PANEL
Chol/HDL Ratio: 4.5 ratio — ABNORMAL HIGH (ref 0.0–4.4)
Cholesterol, Total: 232 mg/dL — ABNORMAL HIGH (ref 100–199)
HDL: 51 mg/dL (ref >39)
LDL Chol Calc (NIH): 145 mg/dL — ABNORMAL HIGH (ref 0–99)
Triglycerides: 201 mg/dL — ABNORMAL HIGH (ref 0–149)
VLDL Cholesterol Cal: 36 mg/dL (ref 5–40)

## 2021-04-06 LAB — HIV ANTIBODY (ROUTINE TESTING W REFLEX): HIV Screen 4th Generation wRfx: NONREACTIVE

## 2021-04-06 LAB — HEMOGLOBIN A1C
Est. average glucose Bld gHb Est-mCnc: 148 mg/dL
Hgb A1c MFr Bld: 6.8 % — ABNORMAL HIGH (ref 4.8–5.6)

## 2021-04-11 ENCOUNTER — Ambulatory Visit: Payer: Medicare Other | Admitting: Internal Medicine

## 2021-04-24 ENCOUNTER — Telehealth: Payer: Self-pay | Admitting: Orthopedic Surgery

## 2021-04-24 ENCOUNTER — Encounter (HOSPITAL_COMMUNITY): Payer: Self-pay

## 2021-04-24 ENCOUNTER — Other Ambulatory Visit: Payer: Self-pay

## 2021-04-24 ENCOUNTER — Ambulatory Visit (HOSPITAL_COMMUNITY): Payer: Medicare Other | Attending: Orthopaedic Surgery

## 2021-04-24 DIAGNOSIS — M6281 Muscle weakness (generalized): Secondary | ICD-10-CM | POA: Insufficient documentation

## 2021-04-24 DIAGNOSIS — G8929 Other chronic pain: Secondary | ICD-10-CM | POA: Diagnosis not present

## 2021-04-24 DIAGNOSIS — M5441 Lumbago with sciatica, right side: Secondary | ICD-10-CM | POA: Insufficient documentation

## 2021-04-24 DIAGNOSIS — R262 Difficulty in walking, not elsewhere classified: Secondary | ICD-10-CM | POA: Diagnosis not present

## 2021-04-24 NOTE — Therapy (Signed)
[facility] [place], [place], [address] Phone: [phone]   Fax:  [phone]  Physical Therapy Evaluation  Patient Details  Name: Jeanette Compton MRN: [id number] Date of Birth: 11-08-1955 Referring Provider (PT): Dr. [name]   Encounter Date: 04/24/2021   PT End of Session - 04/24/21 1647     Visit Number 1    Number of Visits 8    Date for PT Re-Evaluation 05/22/21    Authorization Type UHC Medicare    PT Start Time 1645    PT Stop Time 1730    PT Time Calculation (min) 45 min    Activity Tolerance Patient limited by pain    Behavior During Therapy [facility] for tasks assessed/performed             Past Medical History:  Diagnosis Date   Arthritis    knee , ankle and back   Bronchitis    Cataract    Diabetes mellitus without complication ([place]) 6/96/2952   Hyperlipidemia    Hypertension    Hypothyroidism, adult 02/10/2019   Neuralgia, post-herpetic 11/13/2016   Obesity (BMI 30.0-34.9) 02/10/2019   Thyroid disease    Vitamin D deficiency disease 02/10/2019    Past Surgical History:  Procedure Laterality Date   ABDOMINAL HYSTERECTOMY     fibroid   BREAST BIOPSY Right 07/31/2017   Procedure: EXCISIONAL BREAST BIOPSY WITH NEEDLE LOCALIZATION;  Surgeon: Virl Cagey, MD;  Location: [facility];  Service: General;  Laterality: Right;   BREAST SURGERY Right    COLONOSCOPY  09/13/2011   Procedure: COLONOSCOPY;  Surgeon: Rogene Houston, MD;  Location[facility];  Service: Endoscopy;  Laterality: N/A;  1030   COLONOSCOPY N/A 07/31/2018   Procedure: COLONOSCOPY;  Surgeon: Rogene Houston, MD;  Location: [facility];  Service: Endoscopy;  Laterality: N/A;   POLYPECTOMY  07/31/2018   Procedure: POLYPECTOMY;  Surgeon: Rogene Houston, MD;  [facility];  Service: Endoscopy;;   TOTAL HIP ARTHROPLASTY Right 12/13/2017   Procedure: RIGHT TOTAL HIP ARTHROPLASTY ANTERIOR APPROACH;  Surgeon:  Mcarthur Rossetti, MD;  Location: [facility];  Service: Orthopedics;  Laterality: Right;    There were no vitals filed for this visit.    Subjective Assessment - 04/24/21 1650     Subjective Pt with 20 year history of back pain and 3 year onset of RLE sciatica. Pt notes a multiplicity of other orthopedic ailments and has right total hip replacement and c/o right knee pain. Pt reports brief rehab after her right THR and brief time with chiropractor with little relief and no relief from massage therapy as well. Of note, pt reports she had an injection to her right knee several weeks ago and notes almost full resolution of RLE symptoms and improved comfort in her low back, but symptoms returned    Pertinent History hx of lumbar epidural shot with little relief.    Diagnostic tests Minimal anterolisthesis of L4 on L5 and  L5 on S1, stable. No other spondylolisthesis.   Discs are relatively well maintained in height. There are facet  degenerative changes bilaterally at L4-L5 and L5-S1 greatest on the  left at L5-S1.    Currently in Pain? Yes    Pain Score 10-Worst pain ever    Pain Location Knee    Pain Orientation Right;Lateral    Pain Descriptors / Indicators Aching;Sore    Pain Type Chronic pain  [facility] PT Assessment - 04/24/21 0001       Assessment   Medical Diagnosis Lumbar Spondylosis and RLE scaitica    Referring Provider (PT) Dr. [name]      Home Environment   Living Environment Private residence    Living Arrangements Children    Available Help at Discharge Family    Type of [place]      Prior Function   Level of [place] On disability;Retired      Observation/Other Assessments   Focus on Therapeutic Outcomes (FOTO)  36.24% function      ROM / Strength   AROM / PROM / Strength AROM;Strength      AROM   AROM Assessment Site Lumbar;Knee    Right/Left Knee Right    Right Knee Extension 0    Right Knee  Flexion 122    Lumbar Flexion WNL    Lumbar Extension 10% limited    Lumbar - Right Side Bend WNL    Lumbar - Left Side Bend WNL    Lumbar - Right Rotation WNL    Lumbar - Left Rotation WNL      Strength   Strength Assessment Site Lumbar;Hip    Right/Left Hip Right    Right Hip Flexion 3/5    Right Hip ABduction 4/5    Right Hip ADduction 3+/5    Lumbar Flexion 2+/5      Ambulation/Gait   Ambulation/Gait Yes    Ambulation/Gait Assistance 7: Independent    Ambulation Distance (Feet) 100 Feet    Assistive device None    Gait Pattern Antalgic    Ambulation Surface Level;Indoor                        Objective measurements completed on examination: See above findings.       [facility] Adult PT Treatment/Exercise - 04/24/21 0001       Exercises   Exercises Lumbar      Lumbar Exercises: Stretches   Single Knee to Chest Stretch Right;Left;1 rep;30 seconds    Double Knee to Chest Stretch 1 rep;30 seconds                       PT Short Term Goals - 04/24/21 1732       PT SHORT TERM GOAL #1   Title Patient will be independent with HEP in order to improve functional outcomes.    Time 2    Period Weeks    Status New    Target Date 05/08/21      PT SHORT TERM GOAL #2   Title Patient will report at least 25% improvement in symptoms for improved quality of life.    Baseline 10/10 back and RLE pain    Time 2    Period Weeks    Status New    Target Date 05/08/21               PT Long Term Goals - 04/24/21 1733       PT LONG TERM GOAL #1   Title Patient will improve FOTO score by at least 5 points in order to indicate improved tolerance to activity.    Baseline 34% function    Time 4    Period Weeks    Status New    Target Date 05/22/21      PT LONG TERM GOAL #2   Title Improved trunk flexion strength 3/5  to enhance lumbar stabilization and decrease pain    Baseline 2+/5    Time 4    Period Weeks    Status New    Target Date  05/22/21                    Plan - 04/24/21 1729     Clinical Impression Statement Patient is a 65 yo lady presenting to physical therapy with c/o chronic LBP and radiating RLE pain. She presents with pain limited deficits in core/trunk strength, ROM, endurance, postural impairments, spinal mobility and functional mobility with ADL. She is having to modify and restrict ADL as indicated by FOTO score as well as subjective information and objective measures which is affecting overall participation. Patient will benefit from skilled physical therapy in order to improve function and reduce impairment.    Personal Factors and Comorbidities Age;Time since onset of injury/illness/exacerbation;Comorbidity 1    Comorbidities right THR, right knee OA    Examination-Activity Limitations Bed Mobility;Lift;Stand;Stairs;Squat;Locomotion Level;Reach Overhead    Examination-Participation Restrictions Cleaning;Meal Prep;Community Activity    Stability/Clinical Decision Making Evolving/Moderate complexity    Clinical Decision Making Moderate    Rehab Potential Fair    PT Frequency 2x / week    PT Duration 4 weeks    PT Treatment/Interventions ADLs/Self Care Home Management;Electrical Stimulation;DME Instruction;Gait training;Traction;Stair training;Functional mobility training;Therapeutic activities;Therapeutic exercise;Balance training;Patient/family education;Neuromuscular re-education;Manual techniques;Taping;Dry needling;Spinal Manipulations;Joint Manipulations    PT Next Visit Plan Continue with lumbar ROM/strengthening, poor activity tolerance due to right knee pain/issues    PT Home Exercise Plan [facility], PPT    Consulted and Agree with Plan of Care Patient             Patient will benefit from skilled therapeutic intervention in order to improve the following deficits and impairments:  Abnormal gait, Decreased activity tolerance, Decreased strength, Difficulty walking, Decreased range of  motion, Postural dysfunction, Pain, Obesity  Visit Diagnosis: Chronic low back pain with right-sided sciatica, unspecified back pain laterality  Muscle weakness (generalized)  Difficulty in walking, not elsewhere classified     Problem List Patient Active Problem List   Diagnosis Date Noted   Encounter to establish care 02/28/2021   Encounter for general adult medical examination with abnormal findings 06/09/2019   Hypothyroidism, adult 02/10/2019   Obesity (BMI 30.0-34.9) 02/10/2019   Vitamin D deficiency disease 02/10/2019   Diabetes mellitus without complication ([place]) 49/70/2637   Positive FIT (fecal immunochemical test) 07/29/2018   Rectal bleeding 07/29/2018   Status post total replacement of right hip 12/13/2017   Unilateral primary osteoarthritis, right hip 09/11/2017   Papilloma of right breast    Neuralgia, post-herpetic 11/13/2016   History of colonic polyps 10/05/2016   HLD (hyperlipidemia) 09/10/2016   Essential hypertension 09/10/2016   Primary vitiligo 09/10/2016   Osteoarthritis of knee 09/10/2016   Chronic lower back pain 09/10/2016    Toniann Fail, PT 04/24/2021, 5:35 PM  [facility] [address] [place], [place], [address] Phone: [phone]   Fax:  [phone]  Name: Jeanette Compton MRN: [id number] Date of Birth: July 16, 1955

## 2021-04-24 NOTE — Telephone Encounter (Signed)
Patient called to ask if she may schedule for a knee problem - relays she has been treating in [place] - notes indicate by Dr Ninfa Linden; said needs to find a doctor closer to home for her right knee. Discussed 2nd opinion protocol. Please advise if okay to pursue 2nd opinion for Dr Aline Brochure. (To Engineer, building services)

## 2021-04-26 NOTE — Telephone Encounter (Signed)
I relayed the information per Dr Ruthe Mannan review. States will remain with current provider(s) at [facility].

## 2021-04-27 ENCOUNTER — Encounter (HOSPITAL_COMMUNITY): Payer: Medicare Other

## 2021-05-02 ENCOUNTER — Other Ambulatory Visit: Payer: Self-pay

## 2021-05-02 ENCOUNTER — Encounter (HOSPITAL_COMMUNITY): Payer: Self-pay

## 2021-05-02 ENCOUNTER — Ambulatory Visit (HOSPITAL_COMMUNITY): Payer: Medicare Other

## 2021-05-02 DIAGNOSIS — M5441 Lumbago with sciatica, right side: Secondary | ICD-10-CM

## 2021-05-02 DIAGNOSIS — M6281 Muscle weakness (generalized): Secondary | ICD-10-CM | POA: Diagnosis not present

## 2021-05-02 DIAGNOSIS — R262 Difficulty in walking, not elsewhere classified: Secondary | ICD-10-CM | POA: Diagnosis not present

## 2021-05-02 DIAGNOSIS — G8929 Other chronic pain: Secondary | ICD-10-CM | POA: Diagnosis not present

## 2021-05-02 NOTE — Therapy (Signed)
[facility] [place], [place], [address] Phone: [phone]   Fax:  [phone]  Physical Therapy Treatment  Patient Details  Name: Jeanette Compton MRN: [id number] Date of Birth: [birth year] Referring Provider (PT): Dr. [name]   Encounter Date: 05/02/2021   PT End of Session - 05/02/21 1545     Visit Number 2    Number of Visits 8    Date for PT Re-Evaluation 05/22/21    Authorization Type UHC Medicare    PT Start Time 1532    PT Stop Time 1612    PT Time Calculation (min) 40 min    Activity Tolerance Patient tolerated treatment well;No increased pain    Behavior During Therapy [facility] for tasks assessed/performed             Past Medical History:  Diagnosis Date   Arthritis    knee , ankle and back   Bronchitis    Cataract    Diabetes mellitus without complication ([place]) 11/15/3149   Hyperlipidemia    Hypertension    Hypothyroidism, adult 02/10/2019   Neuralgia, post-herpetic 11/13/2016   Obesity (BMI 30.0-34.9) 02/10/2019   Thyroid disease    Vitamin D deficiency disease 02/10/2019    Past Surgical History:  Procedure Laterality Date   ABDOMINAL HYSTERECTOMY     fibroid   BREAST BIOPSY Right 07/31/2017   Procedure: EXCISIONAL BREAST BIOPSY WITH NEEDLE LOCALIZATION;  Surgeon: Virl Cagey, MD;  Location: [facility];  Service: General;  Laterality: Right;   BREAST SURGERY Right    COLONOSCOPY  09/13/2011   Procedure: COLONOSCOPY;  Surgeon: Rogene Houston, MD;  [facility];  Service: Endoscopy;  Laterality: N/A;  1030   COLONOSCOPY N/A 07/31/2018   Procedure: COLONOSCOPY;  Surgeon: Rogene Houston, MD;  [facility];  Service: Endoscopy;  Laterality: N/A;   POLYPECTOMY  07/31/2018   Procedure: POLYPECTOMY;  Surgeon: Rogene Houston, MD;  [facility];  Service: Endoscopy;;   TOTAL HIP ARTHROPLASTY Right 12/13/2017   Procedure: RIGHT TOTAL HIP ARTHROPLASTY ANTERIOR  APPROACH;  Surgeon: Mcarthur Rossetti, MD;  Location: [facility];  Service: Orthopedics;  Laterality: Right;    There were no vitals filed for this visit.   Subjective Assessment - 05/02/21 1534     Subjective Pt stated she is feeling a little better today, has began streches every morning.  Current pain scale 5/10 Rt lateral thigh, constant sore and achey pain.    Pertinent History hx of lumbar epidural shot with little relief.    Diagnostic tests Minimal anterolisthesis of L4 on L5 and  L5 on S1, stable. No other spondylolisthesis.   Discs are relatively well maintained in height. There are facet  degenerative changes bilaterally at L4-L5 and L5-S1 greatest on the  left at L5-S1.    Currently in Pain? Yes    Pain Score 5     Pain Location Hip    Pain Orientation Right;Lateral    Pain Descriptors / Indicators Aching;Sore    Pain Type Chronic pain    Pain Radiating Towards Rt LE down to ankle    Pain Onset More than a month ago    Pain Frequency Constant    Aggravating Factors  weather, sitting, stand or walk for long periods of time    Pain Relieving Factors tylonel, muscle relaxors  [facility] Adult PT Treatment/Exercise - 05/02/21 0001       Bed Mobility   Bed Mobility Left Sidelying to Sit;Sit to Sidelying Left    Left Sidelying to Sit Supervision/Verbal cueing   Instructed log rolling mechanics   Sit to Sidelying Left Supervision/Verbal cueing   Instructed log rolling mechanics     Exercises   Exercises Lumbar      Lumbar Exercises: Stretches   Single Knee to Chest Stretch 2 reps;30 seconds;Right;Left    Single Knee to Chest Stretch Limitations use of towel for comfort- HEP    Double Knee to Chest Stretch 1 rep;30 seconds    Double Knee to Chest Stretch Limitations use of towel for comfort- HEP      Lumbar Exercises: Supine   Ab Set 10 reps;5 seconds    AB Set Limitations paired with exhale    Pelvic Tilt 10 reps;5 seconds     Pelvic Tilt Limitations posterior pelvic tilt    Bent Knee Raise 10 reps;3 seconds    Bent Knee Raise Limitations with ab set    Bridge 10 reps      Lumbar Exercises: Sidelying   Clam 10 reps;Both;5 seconds    Clam Limitations with ab set and cueing for form                     PT Education - 05/02/21 1547     Education Details Reviewed goals, educated importance of HEP compliance for maximal benefits, pt able to recall and demonstrate appropriately.    Person(s) Educated Patient    Methods Explanation;Verbal cues   verbal cueing to improve breathing wiht ab set   Comprehension Verbalized understanding              PT Short Term Goals - 04/24/21 1732       PT SHORT TERM GOAL #1   Title Patient will be independent with HEP in order to improve functional outcomes.    Time 2    Period Weeks    Status New    Target Date 05/08/21      PT SHORT TERM GOAL #2   Title Patient will report at least 25% improvement in symptoms for improved quality of life.    Baseline 10/10 back and RLE pain    Time 2    Period Weeks    Status New    Target Date 05/08/21               PT Long Term Goals - 04/24/21 1733       PT LONG TERM GOAL #1   Title Patient will improve FOTO score by at least 5 points in order to indicate improved tolerance to activity.    Baseline 34% function    Time 4    Period Weeks    Status New    Target Date 05/22/21      PT LONG TERM GOAL #2   Title Improved trunk flexion strength 3/5 to enhance lumbar stabilization and decrease pain    Baseline 2+/5    Time 4    Period Weeks    Status New    Target Date 05/22/21                   Plan - 05/02/21 1550     Clinical Impression Statement Reviewed goals, educated importance of HEP compliance for maximal benefits.  Pt able to recall and demonstrate appropriate mechanics with cueing for breathing paired wiht  abdominal sets to reduce valsamic maneuver.  Educated prolonged  stretches beneficial for flexibility.  Session focus with core and proximal strengthening in pain free range.  Pt tolerated well to session with reports of Rt hip pain resolved at EOS, was limited by fatigue wiht activities.    Personal Factors and Comorbidities Age;Time since onset of injury/illness/exacerbation;Comorbidity 1    Comorbidities right THR, right knee OA    Examination-Activity Limitations Bed Mobility;Lift;Stand;Stairs;Squat;Locomotion Level;Reach Overhead    Examination-Participation Restrictions Cleaning;Meal Prep;Community Activity    Stability/Clinical Decision Making Evolving/Moderate complexity    Clinical Decision Making Moderate    Rehab Potential Fair    PT Frequency 2x / week    PT Duration 4 weeks    PT Treatment/Interventions ADLs/Self Care Home Management;Electrical Stimulation;DME Instruction;Gait training;Traction;Stair training;Functional mobility training;Therapeutic activities;Therapeutic exercise;Balance training;Patient/family education;Neuromuscular re-education;Manual techniques;Taping;Dry needling;Spinal Manipulations;Joint Manipulations    PT Next Visit Plan Continue with lumbar ROM/strengthening, poor activity tolerance due to right knee pain/issues    PT Home Exercise Plan [facility], PPT    Consulted and Agree with Plan of Care Patient             Patient will benefit from skilled therapeutic intervention in order to improve the following deficits and impairments:  Abnormal gait, Decreased activity tolerance, Decreased strength, Difficulty walking, Decreased range of motion, Postural dysfunction, Pain, Obesity  Visit Diagnosis: Chronic low back pain with right-sided sciatica, unspecified back pain laterality  Muscle weakness (generalized)  Difficulty in walking, not elsewhere classified     Problem List Patient Active Problem List   Diagnosis Date Noted   Encounter to establish care 02/28/2021   Encounter for general adult medical examination  with abnormal findings 06/09/2019   Hypothyroidism, adult 02/10/2019   Obesity (BMI 30.0-34.9) 02/10/2019   Vitamin D deficiency disease 02/10/2019   Diabetes mellitus without complication ([place]) 03/50/0938   Positive FIT (fecal immunochemical test) 07/29/2018   Rectal bleeding 07/29/2018   Status post total replacement of right hip 12/13/2017   Unilateral primary osteoarthritis, right hip 09/11/2017   Papilloma of right breast    Neuralgia, post-herpetic 11/13/2016   History of colonic polyps 10/05/2016   HLD (hyperlipidemia) 09/10/2016   Essential hypertension 09/10/2016   Primary vitiligo 09/10/2016   Osteoarthritis of knee 09/10/2016   Chronic lower back pain 09/10/2016   Ihor [place], LPTA/CLT; CBIS [phone]  Aldona Lento, PTA 05/02/2021, 4:15 PM  [facility] [address] [place], [place], [address] Phone: [phone]   Fax:  [phone]  Name: LEMMIE Compton MRN: [id number] Date of Birth: November 18, 1955

## 2021-05-08 ENCOUNTER — Ambulatory Visit (INDEPENDENT_AMBULATORY_CARE_PROVIDER_SITE_OTHER): Payer: Medicare Other | Admitting: Physician Assistant

## 2021-05-08 ENCOUNTER — Telehealth: Payer: Self-pay

## 2021-05-08 ENCOUNTER — Encounter: Payer: Self-pay | Admitting: Physician Assistant

## 2021-05-08 ENCOUNTER — Encounter (HOSPITAL_COMMUNITY): Payer: Medicare Other | Admitting: Physical Therapy

## 2021-05-08 ENCOUNTER — Ambulatory Visit: Payer: Self-pay

## 2021-05-08 ENCOUNTER — Telehealth (HOSPITAL_COMMUNITY): Payer: Self-pay | Admitting: Physical Therapy

## 2021-05-08 ENCOUNTER — Other Ambulatory Visit: Payer: Self-pay | Admitting: *Deleted

## 2021-05-08 DIAGNOSIS — S83241D Other tear of medial meniscus, current injury, right knee, subsequent encounter: Secondary | ICD-10-CM | POA: Diagnosis not present

## 2021-05-08 DIAGNOSIS — Z96641 Presence of right artificial hip joint: Secondary | ICD-10-CM

## 2021-05-08 DIAGNOSIS — E119 Type 2 diabetes mellitus without complications: Secondary | ICD-10-CM

## 2021-05-08 MED ORDER — RYBELSUS 3 MG PO TABS: 3.0000 mg | ORAL_TABLET | Freq: Every day | ORAL | 0 refills | Status: DC

## 2021-05-08 NOTE — Telephone Encounter (Signed)
Patient called need med refill   Semaglutide (RYBELSUS) 3 MG TABS   [facility]

## 2021-05-08 NOTE — Telephone Encounter (Signed)
Pt medication sent to pharmacy  

## 2021-05-08 NOTE — Telephone Encounter (Signed)
Pt did not show for appt. Called and spoke to patient who states she canceled on the phone tree yesterday due to having a conflicting appt this afternoon.  Reminded of next appt on Wednesday and states she will be here for that appointment.   Jeanette Compton, PTA/CLT, Lissa Morales [phone]

## 2021-05-08 NOTE — Progress Notes (Addendum)
HPI: Jeanette Compton comes in today due to right knee pain.  She was last seen by Dr. Junius Roads on 01/13/2021 and was given a Durolane injection she states that after the injection her pain went away for 2 weeks.  Since then she has had increasing right knee pain.  She does report a fall downstairs 2 weeks ago has had increasing knee pain since then.  Is also having right lateral hip pain.  She states that her right knee locks up on him daily when she has giving way in the knee every other day.  Has taken Tylenol and muscle relaxers help with the pain. She did undergo an MRI of her knee earlier this year which showed full-thickness cartilage loss involving the weightbearing side of his medial femoral condyle and a suspected meniscal tear.  Otherwise mild chondral thinning lateral compartment patellofemoral compartment with no focal defects. She has a history of right total hip arthroplasty.  Radiographs in the past have shown lucency around the proximal portion of the component.  But she is describing no deep groin pain.  Review of systems see HPI otherwise negative  Physical exam: General well-developed well-nourished female no acute distress ambulates without any assistive device.  Bilateral hips excellent range of motion of both hips without pain.  Tenderness over the right hip greater trochanteric region only. Right knee no abnormal warmth erythema or effusion.  Tenderness along medial joint line positive McMurray's.  No instability valgus varus stressing.  Right calf supple nontender.  Radiographs: AP pelvis lateral view of the right hip shows lucency around the proximal portion of the femoral stem consistent with loosening.  Otherwise no acute fractures bony abnormalities.  Hips well located on AP pelvis  Impression: Right knee meniscal tear. History right total hip arthroplasty  Plan: Given patient's failure of conservative treatment right knee MRI showed what most likely represents a meniscal tear  recommend right knee arthroscopy with partial medial meniscectomy.  She understands this will not fix the arthritis in the knee.  She does not wish to undergo a knee replacement she has failed other conservative measures.  In regards to the right hip we may need to work this up with an MRI of the future to evaluate for loosening.  Questions were encouraged and answered at length.  She will follow-up 1 week postop.  Risk benefits of surgery discussed with patient.  Risk include but are not limited to DVT/PE, infection, continued pain and worsening pain.

## 2021-05-10 ENCOUNTER — Encounter (HOSPITAL_COMMUNITY): Payer: Medicare Other

## 2021-05-10 ENCOUNTER — Telehealth (HOSPITAL_COMMUNITY): Payer: Self-pay

## 2021-05-10 NOTE — Telephone Encounter (Signed)
She will be keeping her grandchildren and wants to return on 05/24/2021

## 2021-05-12 ENCOUNTER — Ambulatory Visit (INDEPENDENT_AMBULATORY_CARE_PROVIDER_SITE_OTHER): Payer: Medicare Other | Admitting: Internal Medicine

## 2021-05-12 ENCOUNTER — Other Ambulatory Visit: Payer: Self-pay

## 2021-05-12 ENCOUNTER — Encounter: Payer: Self-pay | Admitting: Internal Medicine

## 2021-05-12 VITALS — BP 140/80

## 2021-05-12 DIAGNOSIS — E1169 Type 2 diabetes mellitus with other specified complication: Secondary | ICD-10-CM | POA: Diagnosis not present

## 2021-05-12 DIAGNOSIS — I10 Essential (primary) hypertension: Secondary | ICD-10-CM | POA: Diagnosis not present

## 2021-05-12 DIAGNOSIS — E669 Obesity, unspecified: Secondary | ICD-10-CM | POA: Insufficient documentation

## 2021-05-12 MED ORDER — RYBELSUS 7 MG PO TABS: 7.0000 mg | ORAL_TABLET | Freq: Every day | ORAL | 5 refills | Status: DC

## 2021-05-12 NOTE — Assessment & Plan Note (Signed)
BP Readings from Last 1 Encounters:  05/12/21 140/80   Well-controlled with Losartan-HCTZ 100-12.5 mg QD now Counseled for compliance with the medications Advised DASH diet and moderate exercise/walking, at least 150 mins/week

## 2021-05-12 NOTE — Assessment & Plan Note (Signed)
Lab Results  Component Value Date   HGBA1C 6.8 (H) 04/05/2021   Diet-controlled Had started Rybelsus 3 mg QD in the last visit, will increase to 7 mg QD Advised to follow diabetic diet On ARB F/u HbA1C, CMP and lipid panel Diabetic eye exam: Advised to follow up with Ophthalmology for diabetic eye exam

## 2021-05-12 NOTE — Progress Notes (Signed)
Virtual Visit via Telephone Note   This visit type was conducted due to national recommendations for restrictions regarding the COVID-19 Pandemic (e.g. social distancing) in an effort to limit this patient's exposure and mitigate transmission in our community.  Due to her co-morbid illnesses, this patient is at least at moderate risk for complications without adequate follow up.  This format is felt to be most appropriate for this patient at this time.  The patient did not have access to video technology/had technical difficulties with video requiring transitioning to audio format only (telephone).  All issues noted in this document were discussed and addressed.  No physical exam could be performed with this format.  Evaluation Performed:  Follow-up visit  Date:  05/12/2021   ID:  Jeanette Compton December 21, 1955, MRN [id number]  Patient Location: Home Provider Location: Office/Clinic  Participants: Patient Location of Patient: Home Location of Provider: Telehealth Consent was obtain for visit to be over via telehealth. I verified that I am speaking with the correct person using two identifiers.  PCP:  Lindell Spar, MD   Chief Complaint: Follow-up of HTN and type II DM  History of Present Illness:    Jeanette Compton is a 65 y.o. female with PMH of HTN, type II DM, hypothyroidism, OA of hip and knee, vitiligo and obesity who has a televisit for follow-up of her chronic medical conditions.  HTN: Her BP is well controlled at home now.  She has started taking losartan HCTZ 100-12.5 milligrams daily.  She denies any headache, dizziness, chest pain, dyspnea or palpitations.  Type II DM: She was started on Rybelsus in the last visit.  She has been having mild nausea and abdominal discomfort with it, but has been getting better.  She is willing to take higher dose of Rybelsus now.  She agrees to take small frequent meals to avoid nausea with Rybelsus.  She denies any fatigue, polyuria,  polydipsia or polyphagia.  The patient does not have symptoms concerning for COVID-19 infection (fever, chills, cough, or new shortness of breath).   Past Medical, Surgical, Social History, Allergies, and Medications have been Reviewed.  Past Medical History:  Diagnosis Date   Arthritis    knee , ankle and back   Bronchitis    Cataract    Diabetes mellitus without complication ([place]) 09/11/5359   Hyperlipidemia    Hypertension    Hypothyroidism, adult 02/10/2019   Neuralgia, post-herpetic 11/13/2016   Obesity (BMI 30.0-34.9) 02/10/2019   Thyroid disease    Vitamin D deficiency disease 02/10/2019   Past Surgical History:  Procedure Laterality Date   ABDOMINAL HYSTERECTOMY     fibroid   BREAST BIOPSY Right 07/31/2017   Procedure: EXCISIONAL BREAST BIOPSY WITH NEEDLE LOCALIZATION;  Surgeon: Virl Cagey, MD;  Location: [facility];  Service: General;  Laterality: Right;   BREAST SURGERY Right    COLONOSCOPY  09/13/2011   Procedure: COLONOSCOPY;  Surgeon: Rogene Houston, MD;  [facility];  Service: Endoscopy;  Laterality: N/A;  1030   COLONOSCOPY N/A 07/31/2018   Procedure: COLONOSCOPY;  Surgeon: Rogene Houston, MD;  [facility];  Service: Endoscopy;  Laterality: N/A;   POLYPECTOMY  07/31/2018   Procedure: POLYPECTOMY;  Surgeon: Rogene Houston, MD;  [facility];  Service: Endoscopy;;   TOTAL HIP ARTHROPLASTY Right 12/13/2017   Procedure: RIGHT TOTAL HIP ARTHROPLASTY ANTERIOR APPROACH;  Surgeon: Mcarthur Rossetti, MD;  Location: [facility];  Service: Orthopedics;  Laterality: Right;     Current Meds  Medication Sig   Cholecalciferol (VITAMIN D3) 125 MCG (5000 UT) CAPS Take 2 capsules by mouth daily.    levothyroxine (SYNTHROID) 75 MCG tablet Take 1 tablet (75 mcg total) by mouth daily.   losartan-hydrochlorothiazide (HYZAAR) 100-12.5 MG tablet Take 1 tablet by mouth daily.   Multiple Vitamins-Minerals (HAIR SKIN AND NAILS FORMULA) TABS Take 2  tablets by mouth daily.   nabumetone (RELAFEN) 750 MG tablet TAKE (1) TABLET BY MOUTH TWICE DAILY AS NEEDED.   Semaglutide (RYBELSUS) 3 MG TABS Take 3 mg by mouth daily.     Allergies:   Lisinopril   ROS:   Please see the history of present illness.     All other systems reviewed and are negative.   Labs/Other Tests and Data Reviewed:    Recent Labs: 04/05/2021: ALT 10; BUN 14; Creatinine, Ser 0.73; Hemoglobin 12.9; Platelets 345; Potassium 4.6; Sodium 141; TSH 0.915   Recent Lipid Panel Lab Results  Component Value Date/Time   CHOL 232 (H) 04/05/2021 09:29 AM   TRIG 201 (H) 04/05/2021 09:29 AM   HDL 51 04/05/2021 09:29 AM   CHOLHDL 4.5 (H) 04/05/2021 09:29 AM   CHOLHDL 4.3 02/10/2019 12:02 PM   LDLCALC 145 (H) 04/05/2021 09:29 AM   LDLCALC 139 (H) 02/10/2019 12:02 PM    Wt Readings from Last 3 Encounters:  02/28/21 205 lb 2.4 oz (93.1 kg)  10/13/20 206 lb (93.4 kg)  07/18/20 210 lb (95.3 kg)      ASSESSMENT & PLAN:    Essential hypertension BP Readings from Last 1 Encounters:  05/12/21 140/80   Well-controlled with Losartan-HCTZ 100-12.5 mg QD now Counseled for compliance with the medications Advised DASH diet and moderate exercise/walking, at least 150 mins/week   Diabetes mellitus type 2 in obese [facility]) Lab Results  Component Value Date   HGBA1C 6.8 (H) 04/05/2021   Diet-controlled Had started Rybelsus 3 mg QD in the last visit, will increase to 7 mg QD Advised to follow diabetic diet On ARB F/u HbA1C, CMP and lipid panel Diabetic eye exam: Advised to follow up with Ophthalmology for diabetic eye exam   Time:   Today, I have spent 15 minutes reviewing the chart, including problem list, medications, and with the patient with telehealth technology discussing the above problems.   Medication Adjustments/Labs and Tests Ordered: Current medicines are reviewed at length with the patient today.  Concerns regarding medicines are outlined above.   Tests  Ordered: No orders of the defined types were placed in this encounter.   Medication Changes: No orders of the defined types were placed in this encounter.    Note: This dictation was prepared with Dragon dictation along with smaller phrase technology. Similar sounding words can be transcribed inadequately or may not be corrected upon review. Any transcriptional errors that result from this process are unintentional.      Disposition:  Follow up  Signed, Lindell Spar, MD  05/12/2021 11:37 AM     [facility]

## 2021-05-17 ENCOUNTER — Ambulatory Visit (HOSPITAL_COMMUNITY): Payer: Medicare Other

## 2021-05-18 ENCOUNTER — Other Ambulatory Visit: Payer: Self-pay

## 2021-05-18 ENCOUNTER — Ambulatory Visit: Payer: Medicare Other

## 2021-05-18 LAB — HM DIABETES EYE EXAM

## 2021-05-23 ENCOUNTER — Encounter (HOSPITAL_COMMUNITY): Payer: Medicare Other | Admitting: Physical Therapy

## 2021-05-24 ENCOUNTER — Telehealth (HOSPITAL_COMMUNITY): Payer: Self-pay

## 2021-05-24 ENCOUNTER — Ambulatory Visit (HOSPITAL_COMMUNITY): Payer: Medicare Other

## 2021-05-24 NOTE — Telephone Encounter (Signed)
Patient l/m to cx she is sick and can not be here today

## 2021-06-05 ENCOUNTER — Telehealth: Payer: Self-pay | Admitting: Internal Medicine

## 2021-06-05 ENCOUNTER — Other Ambulatory Visit: Payer: Self-pay | Admitting: *Deleted

## 2021-06-05 DIAGNOSIS — I10 Essential (primary) hypertension: Secondary | ICD-10-CM

## 2021-06-05 MED ORDER — LOSARTAN POTASSIUM-HCTZ 100-12.5 MG PO TABS: 1.0000 | ORAL_TABLET | Freq: Every day | ORAL | 0 refills | Status: DC

## 2021-06-05 NOTE — Telephone Encounter (Signed)
Pt medication sent to pharmacy  

## 2021-06-05 NOTE — Telephone Encounter (Signed)
Pt called in needs refill on   losartan-hydrochlorothiazide (HYZAAR) 100-12.5 MG tablet    [facility]

## 2021-06-06 ENCOUNTER — Ambulatory Visit (HOSPITAL_COMMUNITY): Payer: Medicare Other | Attending: Orthopaedic Surgery

## 2021-06-06 ENCOUNTER — Encounter (HOSPITAL_COMMUNITY): Payer: Self-pay

## 2021-06-06 NOTE — Therapy (Signed)
[name] [address] [place], [place], [address] Phone: [phone]   Fax:  [phone]  Patient Details  Name: Jeanette Compton MRN: [id number] Date of Birth: 11/08/1955 Referring Provider:  No ref. provider found  Encounter Date: 06/06/2021  PHYSICAL THERAPY DISCHARGE SUMMARY  Visits from Start of Care: 2  Current functional level related to goals / functional outcomes: Unable to determine, pt did not return   Remaining deficits: Unable to determine   Education / Equipment: HEP initiated   Patient agrees to discharge. Patient goals were not met. Patient is being discharged due to not returning since the last visit.  Toniann Fail, PT 06/06/2021, 11:04 AM  [place], [place], [address] Phone: [phone]   Fax:  [phone]

## 2021-06-08 ENCOUNTER — Other Ambulatory Visit: Payer: Self-pay | Admitting: Orthopaedic Surgery

## 2021-06-08 DIAGNOSIS — S83231D Complex tear of medial meniscus, current injury, right knee, subsequent encounter: Secondary | ICD-10-CM | POA: Diagnosis not present

## 2021-06-08 DIAGNOSIS — G8918 Other acute postprocedural pain: Secondary | ICD-10-CM | POA: Diagnosis not present

## 2021-06-08 DIAGNOSIS — M94261 Chondromalacia, right knee: Secondary | ICD-10-CM | POA: Diagnosis not present

## 2021-06-08 MED ORDER — HYDROCODONE-ACETAMINOPHEN 5-325 MG PO TABS: 1.0000 | ORAL_TABLET | Freq: Four times a day (QID) | ORAL | 0 refills | Status: DC | PRN

## 2021-06-09 ENCOUNTER — Other Ambulatory Visit: Payer: Self-pay

## 2021-06-09 DIAGNOSIS — I10 Essential (primary) hypertension: Secondary | ICD-10-CM

## 2021-06-09 MED ORDER — LOSARTAN POTASSIUM-HCTZ 100-12.5 MG PO TABS: 1.0000 | ORAL_TABLET | Freq: Every day | ORAL | 0 refills | Status: DC

## 2021-06-15 ENCOUNTER — Ambulatory Visit (INDEPENDENT_AMBULATORY_CARE_PROVIDER_SITE_OTHER): Payer: Medicare Other | Admitting: Orthopaedic Surgery

## 2021-06-15 ENCOUNTER — Other Ambulatory Visit: Payer: Self-pay

## 2021-06-15 DIAGNOSIS — Z9889 Other specified postprocedural states: Secondary | ICD-10-CM | POA: Insufficient documentation

## 2021-06-15 NOTE — Progress Notes (Signed)
The patient is now 1 week status post a right knee arthroscopy.  We found extensive cartilage wear on the medial femoral condyle only within her knee.  We were able to perform a chondroplasty in this area and debrided back to a stable margin.  She says that she is doing well overall and her right total hip arthroplasty is actually feeling much better as well.  The sutures been removed from her arthroscopy portals.  I did go over her scope pictures with her.  Her calf is soft.  She has just a mild effusion with her right knee but it is bending well.  She will avoid high impact aerobic activities.  She will work on Forensic scientist exercises as well.  I would like to see her back in 4 weeks to see how she is doing overall.  At some point she may be a candidate for hyaluronic acid.  She would later be a candidate for partial medial compartment arthroplasty if other conservative treatment measures fail.

## 2021-06-22 ENCOUNTER — Telehealth: Payer: Self-pay | Admitting: Orthopaedic Surgery

## 2021-06-22 ENCOUNTER — Other Ambulatory Visit (HOSPITAL_COMMUNITY): Payer: Self-pay | Admitting: Internal Medicine

## 2021-06-22 DIAGNOSIS — Z1231 Encounter for screening mammogram for malignant neoplasm of breast: Secondary | ICD-10-CM

## 2021-06-29 ENCOUNTER — Ambulatory Visit: Payer: Medicare Other | Admitting: Physician Assistant

## 2021-07-06 ENCOUNTER — Other Ambulatory Visit: Payer: Self-pay

## 2021-07-06 ENCOUNTER — Ambulatory Visit: Payer: Medicare Other | Admitting: Physician Assistant

## 2021-07-06 ENCOUNTER — Encounter: Payer: Self-pay | Admitting: Physician Assistant

## 2021-07-06 ENCOUNTER — Ambulatory Visit (INDEPENDENT_AMBULATORY_CARE_PROVIDER_SITE_OTHER): Payer: Medicare Other | Admitting: Physician Assistant

## 2021-07-06 ENCOUNTER — Ambulatory Visit (INDEPENDENT_AMBULATORY_CARE_PROVIDER_SITE_OTHER): Payer: Medicare Other

## 2021-07-06 DIAGNOSIS — M7061 Trochanteric bursitis, right hip: Secondary | ICD-10-CM

## 2021-07-06 DIAGNOSIS — Z9889 Other specified postprocedural states: Secondary | ICD-10-CM

## 2021-07-06 DIAGNOSIS — Z96641 Presence of right artificial hip joint: Secondary | ICD-10-CM

## 2021-07-06 MED ORDER — CYCLOBENZAPRINE HCL 10 MG PO TABS: 10.0000 mg | ORAL_TABLET | Freq: Three times a day (TID) | ORAL | 0 refills | Status: DC | PRN

## 2021-07-06 NOTE — Progress Notes (Signed)
Office Visit Note   Patient: Jeanette Compton           Date of Birth: 02/26/1956           MRN: [id number] Visit Date: 07/06/2021              Requested by: Lindell Spar, MD [address] [place],  [place] [address] PCP: Lindell Spar, MD   Assessment & Plan: Visit Diagnoses:  1. Trochanteric bursitis of right hip   2. Status post arthroscopy of right knee   3. Status post total replacement of right hip     Plan: Post right hip injection she has decreased pain and is a actually able to bear weight on the hip.  Unable to logroll her hip with minimal discomfort.  She does not ask about getting a muscle relaxant if sent in some Flexeril for her.  She will continue to take Tylenol and NSAIDs for pain she does not wish to take narcotics.  She was given a wheelchair prescription.  She will work on IT band stretching if possible.  We will send therapy out to her home to work on gait balance and also work on ADLs.  Questions were encouraged and answered at length.  She will follow-up with [place] in 2 weeks or if her symptoms return or becomes worse.  Follow-Up Instructions: No follow-ups on file.   Orders:  Orders Placed This Encounter  Procedures   XR FEMUR, MIN 2 VIEWS RIGHT   Ambulatory referral to [facility] ordered this encounter  Medications   cyclobenzaprine (FLEXERIL) 10 MG tablet    Sig: Take 1 tablet (10 mg total) by mouth 3 (three) times daily as needed for muscle spasms.    Dispense:  30 tablet    Refill:  0      Procedures: No procedures performed   Clinical Data: No additional findings.   Subjective: Chief Complaint  Patient presents with   Right Leg - Pain    HPI  Review of Systems   Objective: Vital Signs: There were no vitals taken for this visit.  Physical Exam Constitutional:      Appearance: She is not ill-appearing or diaphoretic.  Pulmonary:     Effort: Pulmonary effort is normal.  Neurological:     Mental Status: She is alert  and oriented to person, place, and time.  Psychiatric:        Mood and Affect: Mood normal.    Ortho Exam Right hip unable to range secondary to extreme pain with any attempts of motion.  Supporting her femur she is able to extend and flex her knee.  She has near full extension of the knee and flexion to just beyond 90 degrees.  Calf and right thigh are supple and nontender tender.  Tenderness over the right trochanteric region which is severe.  She has difficulty bearing weight on the right hip.  Negative for radicular symptoms down the right leg.  Specialty Comments:  No specialty comments available.  Imaging: XR FEMUR, MIN 2 VIEWS RIGHT  Result Date: 07/06/2021 Right femur AP and Lateral :No change in lucency proximal femoral component. There are no acute findings or fractures. Right hip and knee are well located.     PMFS History: Patient Active Problem List   Diagnosis Date Noted   Status post arthroscopy of right knee 06/15/2021   Diabetes mellitus type 2 in obese [facility]) 05/12/2021   Encounter for general adult medical  examination with abnormal findings 06/09/2019   Hypothyroidism, adult 02/10/2019   Obesity (BMI 30.0-34.9) 02/10/2019   Vitamin D deficiency disease 02/10/2019   Positive FIT (fecal immunochemical test) 07/29/2018   Rectal bleeding 07/29/2018   Status post total replacement of right hip 12/13/2017   Unilateral primary osteoarthritis, right hip 09/11/2017   Papilloma of right breast    Neuralgia, post-herpetic 11/13/2016   History of colonic polyps 10/05/2016   HLD (hyperlipidemia) 09/10/2016   Essential hypertension 09/10/2016   Primary vitiligo 09/10/2016   Osteoarthritis of knee 09/10/2016   Chronic lower back pain 09/10/2016   Past Medical History:  Diagnosis Date   Arthritis    knee , ankle and back   Bronchitis    Cataract    Diabetes mellitus without complication ([place]) 09/04/3843   Hyperlipidemia    Hypertension    Hypothyroidism, adult  02/10/2019   Neuralgia, post-herpetic 11/13/2016   Obesity (BMI 30.0-34.9) 02/10/2019   Thyroid disease    Vitamin D deficiency disease 02/10/2019    Family History  Problem Relation Age of Onset   Stroke Mother    Diabetes Mother    Alcohol abuse Father    Early death Father    Early death Sister        pneumonia   Cancer Brother    Alzheimer's disease Sister    COPD Brother    Colon cancer Neg Hx     Past Surgical History:  Procedure Laterality Date   ABDOMINAL HYSTERECTOMY     fibroid   BREAST BIOPSY Right 07/31/2017   Procedure: EXCISIONAL BREAST BIOPSY WITH NEEDLE LOCALIZATION;  Surgeon: Virl Cagey, MD;  Location: [facility];  Service: General;  Laterality: Right;   BREAST SURGERY Right    COLONOSCOPY  09/13/2011   Procedure: COLONOSCOPY;  Surgeon: Rogene Houston, MD;  [facility];  Service: Endoscopy;  Laterality: N/A;  1030   COLONOSCOPY N/A 07/31/2018   Procedure: COLONOSCOPY;  Surgeon: Rogene Houston, MD;  [facility];  Service: Endoscopy;  Laterality: N/A;   POLYPECTOMY  07/31/2018   Procedure: POLYPECTOMY;  Surgeon: Rogene Houston, MD;  [facility];  Service: Endoscopy;;   TOTAL HIP ARTHROPLASTY Right 12/13/2017   Procedure: RIGHT TOTAL HIP ARTHROPLASTY ANTERIOR APPROACH;  Surgeon: Mcarthur Rossetti, MD;  [facility];  Service: Orthopedics;  Laterality: Right;   Social History   Occupational History   Occupation: disability  Tobacco Use   Smoking status: Former    Packs/day: 0.50    Years: 3.00    Pack years: 1.50    Types: Cigarettes    Quit date: 1981    Years since quitting: 42.1   Smokeless tobacco: Never  Vaping Use   Vaping Use: Never used  Substance and Sexual Activity   Alcohol use: No   Drug use: Yes    Types: Marijuana   Sexual activity: Never    Birth control/protection: Surgical

## 2021-07-07 ENCOUNTER — Telehealth: Payer: Self-pay | Admitting: Orthopaedic Surgery

## 2021-07-07 ENCOUNTER — Other Ambulatory Visit: Payer: Self-pay | Admitting: Physician Assistant

## 2021-07-07 MED ORDER — HYDROCODONE-ACETAMINOPHEN 5-325 MG PO TABS: 1.0000 | ORAL_TABLET | Freq: Four times a day (QID) | ORAL | 0 refills | Status: DC | PRN

## 2021-07-07 NOTE — Telephone Encounter (Signed)
Called and advised.

## 2021-07-07 NOTE — Telephone Encounter (Signed)
Please advise 

## 2021-07-07 NOTE — Telephone Encounter (Signed)
Pt called requesting pain medication.Pt states she had a injection and it made her pain worse in her hip. Please send medication to pharmacy on file. Please call pt at [phone].

## 2021-07-10 ENCOUNTER — Telehealth: Payer: Self-pay | Admitting: Physician Assistant

## 2021-07-10 NOTE — Telephone Encounter (Signed)
07/06/21 ov note faxed to [facility] [phone]

## 2021-07-13 ENCOUNTER — Ambulatory Visit (INDEPENDENT_AMBULATORY_CARE_PROVIDER_SITE_OTHER): Payer: Medicare Other | Admitting: Orthopaedic Surgery

## 2021-07-13 DIAGNOSIS — M25551 Pain in right hip: Secondary | ICD-10-CM

## 2021-07-13 MED ORDER — HYDROCODONE-ACETAMINOPHEN 7.5-325 MG PO TABS: 1.0000 | ORAL_TABLET | Freq: Four times a day (QID) | ORAL | 0 refills | Status: DC | PRN

## 2021-07-13 MED ORDER — BACLOFEN 10 MG PO TABS: 10.0000 mg | ORAL_TABLET | Freq: Three times a day (TID) | ORAL | 0 refills | Status: DC | PRN

## 2021-07-13 NOTE — Addendum Note (Signed)
Addended by: Robyne Peers on: 07/13/2021 04:42 PM   Modules accepted: Orders

## 2021-07-13 NOTE — Progress Notes (Signed)
The patient comes in today still quite tearful with severe right hip pain.  She has a remote history of a right hip replacement and then in January of this year we actually performed arthroscopic surgery on her right knee.  We found meniscal tear and significant cartilage wear.  We saw her at her first visit on 06/15/2021 postoperatively and she seems to doing well from her surgery.  She then had some type of fall and now she has very severe right hip pain.  We have imaged her hip with x-rays of the hip and the femur a week or 2 ago.  There is certainly lucency around the proximal aspect of the femoral component which is chronic but I see no fractures at all.  On exam even try to flex her hip at all she has significant severe pain that brings her to tears and crying.  She is in need of a wheelchair.  Right now she cannot even get around her house well due to the severe pain she is having her right hip.  A manual wheelchair is certainly appropriate and can fit in her house.  She needs this to be able to get around for just grooming and dressing as well as bathing and getting in and out of the house and to food.  She cannot get around well with a walker and I am concerned about her hip with her being a fall risk that it is much more appropriate to have a wheelchair at this standpoint to offload her right hip.  Use of a mechanical wheelchair with significant improve her ability to at least participate in the mobility related activities of daily living while we continue to find out why she is having severe debilitating acute right hip pain.  We have tried anti-inflammatories and rest as well as a steroid injection of the trochanteric area.  None of this is helped.  Even narcotic pain medicines and muscle relaxants have not helped.  We will try baclofen at this standpoint and increase her dosage of hydrocodone.  We need to obtain an MRI of the right hip to assess for any type of acute injury or fracture that is not  seen on plain films given the severity of the patient's pain and the fact that is not improving at all.  We will see her back once we have this MRI of the right hip.

## 2021-07-20 ENCOUNTER — Ambulatory Visit: Payer: Medicare Other | Admitting: Physician Assistant

## 2021-07-24 ENCOUNTER — Other Ambulatory Visit: Payer: Self-pay | Admitting: Internal Medicine

## 2021-07-24 DIAGNOSIS — E1169 Type 2 diabetes mellitus with other specified complication: Secondary | ICD-10-CM

## 2021-07-24 DIAGNOSIS — E039 Hypothyroidism, unspecified: Secondary | ICD-10-CM

## 2021-07-25 ENCOUNTER — Ambulatory Visit
Admission: RE | Admit: 2021-07-25 | Discharge: 2021-07-25 | Disposition: A | Discharge status: Home / Self Care | Payer: Medicare Other | Source: Ambulatory Visit | Attending: Orthopaedic Surgery | Admitting: Orthopaedic Surgery

## 2021-07-25 DIAGNOSIS — M25551 Pain in right hip: Secondary | ICD-10-CM | POA: Diagnosis not present

## 2021-07-27 ENCOUNTER — Ambulatory Visit (INDEPENDENT_AMBULATORY_CARE_PROVIDER_SITE_OTHER): Payer: Medicare Other | Admitting: Physician Assistant

## 2021-07-27 ENCOUNTER — Encounter: Payer: Self-pay | Admitting: Physician Assistant

## 2021-07-27 ENCOUNTER — Other Ambulatory Visit: Payer: Self-pay

## 2021-07-27 DIAGNOSIS — Z96641 Presence of right artificial hip joint: Secondary | ICD-10-CM

## 2021-07-27 DIAGNOSIS — M25551 Pain in right hip: Secondary | ICD-10-CM | POA: Diagnosis not present

## 2021-07-27 DIAGNOSIS — M25451 Effusion, right hip: Secondary | ICD-10-CM | POA: Diagnosis not present

## 2021-07-27 MED ORDER — HYDROCODONE-ACETAMINOPHEN 7.5-325 MG PO TABS: 1.0000 | ORAL_TABLET | Freq: Four times a day (QID) | ORAL | 0 refills | Status: DC | PRN

## 2021-07-27 MED ORDER — BACLOFEN 10 MG PO TABS: 10.0000 mg | ORAL_TABLET | Freq: Three times a day (TID) | ORAL | 0 refills | Status: DC | PRN

## 2021-07-27 NOTE — Progress Notes (Signed)
HPI: [name] returns today in follow-up of her right hip pain.  Again she had a fall after knee arthroscopy then was seen in our office about a week later on 07/06/2021.  She was given a trochanteric injection right hip in her office she had decreased pain but her pain returned shortly thereafter.  Then she was seen on 07/13/2021 was having severe pain.  She was sent for an MRI of her right hip and is here today for follow-up.  She states she is not having pain whenever she is not up trying to walk on the hip.  She is walking occasionally with a walker very short distances.  She has been taking baclofen and hydrocodone for relief.  She denies any fevers . ?MRI dated 07/25/2021 is reviewed with the patient.  This shows edema within the abductor muscles, gluteus minimus, vastus lateralis and vastus intermedius muscles consistent with muscle strain.  Also stress reaction of the distal femoral stem component with mild periostitis and cortical edema.  No evidence of fracture. ? ?Review of systems: See HPI ? ?Physical exam: ?Right hip any attempts of motion causes significant pain.  She has full range of motion of the right knee without pain.  Tenderness over the mid femur and also tenderness over the right trochanteric region. ? ? ?Impression: Right hip strain ? ?Plan: Refill her baclofen and hydrocodone.  She will continue use walker to ambulate and offload the hip.  We will obtain a bone scan to rule out loosening/infection versus stress fracture.  Also check a CBC, sed rate and CRP today.  Have her follow-up after the bone scan to go over the results and discuss further treatment.  Questions were encouraged and answered at length. ?

## 2021-07-27 NOTE — Addendum Note (Signed)
Addended by: Meyer Cory on: 07/27/2021 05:07 PM ? ? Modules accepted: Orders ? ?

## 2021-07-28 LAB — CBC WITH DIFFERENTIAL/PLATELET
Absolute Monocytes: 681 cells/uL (ref 200–950)
Basophils Absolute: 66 cells/uL (ref 0–200)
Basophils Relative: 0.8 %
Eosinophils Absolute: 83 cells/uL (ref 15–500)
Eosinophils Relative: 1 %
HCT: 37 % (ref 35.0–45.0)
Hemoglobin: 12.3 g/dL (ref 11.7–15.5)
Lymphs Abs: 2971 cells/uL (ref 850–3900)
MCH: 27.6 pg (ref 27.0–33.0)
MCHC: 33.2 g/dL (ref 32.0–36.0)
MCV: 83 fL (ref 80.0–100.0)
MPV: 10.3 fL (ref 7.5–12.5)
Monocytes Relative: 8.2 %
Neutro Abs: 4499 cells/uL (ref 1500–7800)
Neutrophils Relative %: 54.2 %
Platelets: 351 10*3/uL (ref 140–400)
RBC: 4.46 10*6/uL (ref 3.80–5.10)
RDW: 13.5 % (ref 11.0–15.0)
Total Lymphocyte: 35.8 %
WBC: 8.3 10*3/uL (ref 3.8–10.8)

## 2021-07-28 LAB — SEDIMENTATION RATE: Sed Rate: 74 mm/h — ABNORMAL HIGH (ref 0–30)

## 2021-07-28 LAB — C-REACTIVE PROTEIN: CRP: 22.6 mg/L — ABNORMAL HIGH (ref <8.0)

## 2021-08-04 ENCOUNTER — Ambulatory Visit (HOSPITAL_COMMUNITY): Payer: Medicare Other

## 2021-08-10 ENCOUNTER — Ambulatory Visit (HOSPITAL_COMMUNITY)
Admission: RE | Admit: 2021-08-10 | Discharge: 2021-08-10 | Disposition: A | Discharge status: Home / Self Care | Payer: Medicare Other | Source: Ambulatory Visit | Attending: Physician Assistant | Admitting: Physician Assistant

## 2021-08-10 ENCOUNTER — Other Ambulatory Visit: Payer: Self-pay

## 2021-08-10 DIAGNOSIS — R600 Localized edema: Secondary | ICD-10-CM | POA: Diagnosis not present

## 2021-08-10 DIAGNOSIS — Z96641 Presence of right artificial hip joint: Secondary | ICD-10-CM | POA: Insufficient documentation

## 2021-08-10 DIAGNOSIS — M25551 Pain in right hip: Secondary | ICD-10-CM | POA: Insufficient documentation

## 2021-08-10 DIAGNOSIS — M25561 Pain in right knee: Secondary | ICD-10-CM | POA: Diagnosis not present

## 2021-08-10 DIAGNOSIS — Z471 Aftercare following joint replacement surgery: Secondary | ICD-10-CM | POA: Diagnosis not present

## 2021-08-10 MED ORDER — TECHNETIUM TC 99M MEDRONATE IV KIT
20.0000 | PACK | Freq: Once | INTRAVENOUS | Status: AC | PRN
  Administered 2021-08-10: 20.9 via INTRAVENOUS

## 2021-08-14 ENCOUNTER — Other Ambulatory Visit: Payer: Self-pay

## 2021-08-14 ENCOUNTER — Ambulatory Visit (HOSPITAL_COMMUNITY)
Admission: RE | Admit: 2021-08-14 | Discharge: 2021-08-14 | Disposition: A | Discharge status: Home / Self Care | Payer: Medicare Other | Source: Ambulatory Visit | Attending: Internal Medicine | Admitting: Internal Medicine

## 2021-08-14 DIAGNOSIS — Z1231 Encounter for screening mammogram for malignant neoplasm of breast: Secondary | ICD-10-CM | POA: Insufficient documentation

## 2021-08-17 ENCOUNTER — Telehealth: Payer: Self-pay | Admitting: Physician Assistant

## 2021-08-17 ENCOUNTER — Other Ambulatory Visit: Payer: Self-pay | Admitting: Physician Assistant

## 2021-08-17 ENCOUNTER — Encounter: Payer: Self-pay | Admitting: Physician Assistant

## 2021-08-17 ENCOUNTER — Ambulatory Visit (INDEPENDENT_AMBULATORY_CARE_PROVIDER_SITE_OTHER): Payer: Medicare Other | Admitting: Physician Assistant

## 2021-08-17 DIAGNOSIS — T84030D Mechanical loosening of internal right hip prosthetic joint, subsequent encounter: Secondary | ICD-10-CM | POA: Diagnosis not present

## 2021-08-17 MED ORDER — HYDROCODONE-ACETAMINOPHEN 7.5-325 MG PO TABS: 1.0000 | ORAL_TABLET | Freq: Four times a day (QID) | ORAL | 0 refills | Status: DC | PRN

## 2021-08-17 MED ORDER — METHOCARBAMOL 500 MG PO TABS: 500.0000 mg | ORAL_TABLET | Freq: Three times a day (TID) | ORAL | 1 refills | Status: DC

## 2021-08-17 NOTE — Telephone Encounter (Signed)
Please advise 

## 2021-08-17 NOTE — Telephone Encounter (Signed)
Refill on hydrocodone and robaxin  ?

## 2021-08-17 NOTE — Progress Notes (Signed)
? ?Office Visit Note ?  ?Patient: Jeanette Compton           ?Date of Birth: Aug 25, 1955           ?MRN: [id number] ?Visit Date: 08/17/2021 ?             ?Requested by: Lindell Spar, MD ?[address] ?[place],  [place] [address] ?PCP: Lindell Spar, MD ? ? ?Assessment & Plan: ?Visit Diagnoses:  ?1. Femoral loosening of prosthetic right hip, subsequent encounter   ? ? ?Plan: Given the patient's continued pain and findings on MRI and the three-phase bone scan recommend revision of the right hip femoral component.  Risk benefits of surgery discussed with patient.  She understands risk of hip surgery also understands that there is an increased risk of possible fracture of the femoral diaphysis.  We will work on setting her up for revision of her right hip in the near future.  She also will try to find out what her vitamin D levels are at this point in time.  Questions were encouraged and answered at length. ? ?Follow-Up Instructions: Return for post op.  ? ?Orders:  ?No orders of the defined types were placed in this encounter. ? ?No orders of the defined types were placed in this encounter. ? ? ? ? Procedures: ?No procedures performed ? ? ?Clinical Data: ?No additional findings. ? ? ?Subjective: ?Chief Complaint  ?Patient presents with  ? Right Hip - Routine Post Op  ? ? ?HPI ?Ms. Speece returns today to go over the bone scan of her right total hip arthroplasty.  She states she still not able to walk for long distance due to the right thigh pain.  She points to the anterior aspect of the hip as her main pain.  She states she is just slightly better than she was prior visit.  She has had no fevers or chills.  No other falls or injuries.  She is status post right total hip arthroplasty 12/13/2017.  Patient has had several falls.  She also reports low vitamin D in the past. ?Three-phase bone scan dated 08/10/2021 showed progressive increased radiotracer uptake within the proximal right femur seen in all 3 phases.  Most  pronounced in the delayed images.  Findings consistent with loosening of the femoral component of the right total hip arthroplasty.  MRI did show reactive edema involving the right hip femoral component region. ? ?Review of Systems ?See HPI otherwise negative ? ?Objective: ?Vital Signs: There were no vitals taken for this visit. ? ?Physical Exam ?Constitutional:   ?   Appearance: She is not ill-appearing.  ?Pulmonary:  ?   Effort: Pulmonary effort is normal.  ?Neurological:  ?   Mental Status: She is alert and oriented to person, place, and time.  ?Psychiatric:     ?   Mood and Affect: Mood normal.  ? ? ?Ortho Exam ?Right hip fluid internal/external rotation however she has significant pain with internal/external rotation.  Minimal tenderness over the right hip trochanteric region. ?Specialty Comments:  ?No specialty comments available. ? ?Imaging: ?No results found. ? ? ?PMFS History: ?Patient Active Problem List  ? Diagnosis Date Noted  ? Status post arthroscopy of right knee 06/15/2021  ? Diabetes mellitus type 2 in obese ([place]) 05/12/2021  ? Encounter for general adult medical examination with abnormal findings 06/09/2019  ? Hypothyroidism, adult 02/10/2019  ? Obesity (BMI 30.0-34.9) 02/10/2019  ? Vitamin D deficiency disease 02/10/2019  ? Positive FIT (fecal  immunochemical test) 07/29/2018  ? Rectal bleeding 07/29/2018  ? Status post total replacement of right hip 12/13/2017  ? Unilateral primary osteoarthritis, right hip 09/11/2017  ? Papilloma of right breast   ? Neuralgia, post-herpetic 11/13/2016  ? History of colonic polyps 10/05/2016  ? HLD (hyperlipidemia) 09/10/2016  ? Essential hypertension 09/10/2016  ? Primary vitiligo 09/10/2016  ? Osteoarthritis of knee 09/10/2016  ? Chronic lower back pain 09/10/2016  ? ?Past Medical History:  ?Diagnosis Date  ? Arthritis   ? knee , ankle and back  ? Bronchitis   ? Cataract   ? Diabetes mellitus without complication ([place]) 1/61/0960  ? Hyperlipidemia   ?  Hypertension   ? Hypothyroidism, adult 02/10/2019  ? Neuralgia, post-herpetic 11/13/2016  ? Obesity (BMI 30.0-34.9) 02/10/2019  ? Thyroid disease   ? Vitamin D deficiency disease 02/10/2019  ?  ?Family History  ?Problem Relation Age of Onset  ? Stroke Mother   ? Diabetes Mother   ? Alcohol abuse Father   ? Early death Father   ? Early death Sister   ?     pneumonia  ? Cancer Brother   ? Alzheimer's disease Sister   ? COPD Brother   ? Colon cancer Neg Hx   ?  ?Past Surgical History:  ?Procedure Laterality Date  ? ABDOMINAL HYSTERECTOMY    ? fibroid  ? BREAST BIOPSY Right 07/31/2017  ? Procedure: EXCISIONAL BREAST BIOPSY WITH NEEDLE LOCALIZATION;  Surgeon: Virl Cagey, MD;  Location: [facility];  Service: General;  Laterality: Right;  ? BREAST SURGERY Right   ? COLONOSCOPY  09/13/2011  ? Procedure: COLONOSCOPY;  Surgeon: Rogene Houston, MD;  [facility]: [facility];  Service: Endoscopy;  Laterality: N/A;  1030  ? COLONOSCOPY N/A 07/31/2018  ? Procedure: COLONOSCOPY;  Surgeon: Rogene Houston, MD;  [facility]: [facility];  Service: Endoscopy;  Laterality: N/A;  ? POLYPECTOMY  07/31/2018  ? Procedure: POLYPECTOMY;  Surgeon: Rogene Houston, MD;  [facility]: [facility];  Service: Endoscopy;;  ? TOTAL HIP ARTHROPLASTY Right 12/13/2017  ? Procedure: RIGHT TOTAL HIP ARTHROPLASTY ANTERIOR APPROACH;  Surgeon: Mcarthur Rossetti, MD;  Location: [facility];  Service: Orthopedics;  Laterality: Right;  ? ?Social History  ? ?Occupational History  ? Occupation: disability  ?Tobacco Use  ? Smoking status: Former  ?  Packs/day: 0.50  ?  Years: 3.00  ?  Pack years: 1.50  ?  Types: Cigarettes  ?  Quit date: 75  ?  Years since quitting: 42.2  ? Smokeless tobacco: Never  ?Vaping Use  ? Vaping Use: Never used  ?Substance and Sexual Activity  ? Alcohol use: No  ? Drug use: Yes  ?  Types: Marijuana  ? Sexual activity: Never  ?  Birth control/protection: Surgical  ? ? ? ? ? ? ?

## 2021-08-18 ENCOUNTER — Telehealth: Payer: Self-pay | Admitting: Internal Medicine

## 2021-08-18 NOTE — Telephone Encounter (Signed)
Patient called in regard to VIT D level ? ?States that she will have surgery on leg / hip  ?And needs current levels ?  ?Reports can go to [employer]  ? ?Can call patient back in regard  ?

## 2021-08-18 NOTE — Telephone Encounter (Signed)
Called and advised pt.

## 2021-08-22 NOTE — Telephone Encounter (Signed)
Patient aware to call office and have forms faxed over  ?

## 2021-09-05 ENCOUNTER — Other Ambulatory Visit: Payer: Self-pay | Admitting: Orthopaedic Surgery

## 2021-09-05 ENCOUNTER — Telehealth: Payer: Self-pay | Admitting: Orthopaedic Surgery

## 2021-09-05 MED ORDER — HYDROCODONE-ACETAMINOPHEN 7.5-325 MG PO TABS: 1.0000 | ORAL_TABLET | Freq: Four times a day (QID) | ORAL | 0 refills | Status: DC | PRN

## 2021-09-05 NOTE — Telephone Encounter (Signed)
Patient called. She would like a refill on hydrocodone.  ?

## 2021-09-12 ENCOUNTER — Other Ambulatory Visit: Payer: Self-pay

## 2021-09-20 ENCOUNTER — Ambulatory Visit (INDEPENDENT_AMBULATORY_CARE_PROVIDER_SITE_OTHER): Payer: Medicare Other | Admitting: Internal Medicine

## 2021-09-20 ENCOUNTER — Encounter: Payer: Self-pay | Admitting: Internal Medicine

## 2021-09-20 VITALS — BP 140/82 | HR 87 | Resp 18 | Ht 66.0 in | Wt 191.8 lb

## 2021-09-20 DIAGNOSIS — E782 Mixed hyperlipidemia: Secondary | ICD-10-CM

## 2021-09-20 DIAGNOSIS — M1611 Unilateral primary osteoarthritis, right hip: Secondary | ICD-10-CM

## 2021-09-20 DIAGNOSIS — E559 Vitamin D deficiency, unspecified: Secondary | ICD-10-CM | POA: Diagnosis not present

## 2021-09-20 DIAGNOSIS — I10 Essential (primary) hypertension: Secondary | ICD-10-CM

## 2021-09-20 DIAGNOSIS — E039 Hypothyroidism, unspecified: Secondary | ICD-10-CM | POA: Diagnosis not present

## 2021-09-20 DIAGNOSIS — E669 Obesity, unspecified: Secondary | ICD-10-CM | POA: Diagnosis not present

## 2021-09-20 DIAGNOSIS — E1169 Type 2 diabetes mellitus with other specified complication: Secondary | ICD-10-CM

## 2021-09-20 LAB — POCT GLYCOSYLATED HEMOGLOBIN (HGB A1C)
HbA1c, POC (controlled diabetic range): 6.2 % (ref 0.0–7.0)
HbA1c, POC (prediabetic range): 6.2 % (ref 5.7–6.4)

## 2021-09-20 MED ORDER — ROSUVASTATIN CALCIUM 5 MG PO TABS: 5.0000 mg | ORAL_TABLET | Freq: Every day | ORAL | 1 refills | Status: DC

## 2021-09-20 NOTE — Patient Instructions (Signed)
Please continue taking medications as prescribed. ? ?Please continue to follow low carb diet and ambulate as tolerated. ? ?Please get fasting blood tests before the next visit. ? ?Please consider getting Shingrix vaccine at your local pharmacy. ?

## 2021-09-20 NOTE — Assessment & Plan Note (Signed)
BP Readings from Last 1 Encounters:  ?09/20/21 140/82  ? ?Well-controlled with Losartan-HCTZ 100-12.5 mg QD now ?Counseled for compliance with the medications ?Advised DASH diet and moderate exercise/walking, at least 150 mins/week ?

## 2021-09-20 NOTE — Assessment & Plan Note (Signed)
Lab Results  ?Component Value Date  ? HGBA1C 6.8 (H) 04/05/2021  ? ?Well-controlled ?On Rybelsus 7 mg QD ?Advised to follow diabetic diet ?On ARB ?F/u HbA1C, CMP and lipid panel ?Diabetic eye exam: Advised to follow up with Ophthalmology for diabetic eye exam ?

## 2021-09-20 NOTE — Assessment & Plan Note (Signed)
Check lipid profile ?Started Crestor ?

## 2021-09-20 NOTE — Assessment & Plan Note (Signed)
S/p right hip arthroplasty, needs revision surgery ?Followed by Orthopedic surgery ?

## 2021-09-20 NOTE — Progress Notes (Signed)
? ?Established Patient Office Visit ? ?Subjective:  ?Patient ID: Jeanette Compton, female    DOB: 1956-01-10  Age: 66 y.o. MRN: [id number] ? ?CC:  ?Chief Complaint  ?Patient presents with  ? Follow-up  ?  Follow up pt has to have hip surgery 10-23-21 this is the second time   ? ? ?HPI ?Jeanette Compton is a 66 y.o. female with past medical history of HTN, type II DM, hypothyroidism, OA of hip and knee, vitiligo and obesity who presents for f/u of her chronic medical conditions. ? ?HTN: Her BP is well controlled at home now.  She has started taking losartan HCTZ 100-12.5 milligrams daily.  She denies any headache, dizziness, chest pain, dyspnea or palpitations. ?  ?Type II DM: She has ben taking Rybelsus for it. Her HbA1C was 6.2 in the office today. She denies any fatigue, polyuria, polydipsia or polyphagia. She has lost about 14 lbs since starting Rybelsus. ? ?She complains of right hip pain, which is worse with movement and standing.  She has had right THA, which needs revision surgery, followed by Orthopedic surgery.  She denies any numbness or tingling of the LE. ? ? ?Past Medical History:  ?Diagnosis Date  ? Arthritis   ? knee , ankle and back  ? Bronchitis   ? Cataract   ? Diabetes mellitus without complication ([place]) 06/30/4707  ? Hyperlipidemia   ? Hypertension   ? Hypothyroidism, adult 02/10/2019  ? Neuralgia, post-herpetic 11/13/2016  ? Obesity (BMI 30.0-34.9) 02/10/2019  ? Thyroid disease   ? Vitamin D deficiency disease 02/10/2019  ? ? ?Past Surgical History:  ?Procedure Laterality Date  ? ABDOMINAL HYSTERECTOMY    ? fibroid  ? BREAST BIOPSY Right 07/31/2017  ? Procedure: EXCISIONAL BREAST BIOPSY WITH NEEDLE LOCALIZATION;  Surgeon: Virl Cagey, MD;  Location: [facility];  Service: General;  Laterality: Right;  ? BREAST SURGERY Right   ? COLONOSCOPY  09/13/2011  ? Procedure: COLONOSCOPY;  Surgeon: Rogene Houston, MD;  [facility];  Service: Endoscopy;  Laterality: N/A;  1030  ? COLONOSCOPY N/A 07/31/2018   ? Procedure: COLONOSCOPY;  Surgeon: Rogene Houston, MD;  [facility];  Service: Endoscopy;  Laterality: N/A;  ? POLYPECTOMY  07/31/2018  ? Procedure: POLYPECTOMY;  Surgeon: Rogene Houston, MD;  [facility];  Service: Endoscopy;;  ? TOTAL HIP ARTHROPLASTY Right 12/13/2017  ? Procedure: RIGHT TOTAL HIP ARTHROPLASTY ANTERIOR APPROACH;  Surgeon: Mcarthur Rossetti, MD;  Location: [facility];  Service: Orthopedics;  Laterality: Right;  ? ? ?Family History  ?Problem Relation Age of Onset  ? Stroke Mother   ? Diabetes Mother   ? Alcohol abuse Father   ? Early death Father   ? Early death Sister   ?     pneumonia  ? Cancer Brother   ? Alzheimer's disease Sister   ? COPD Brother   ? Colon cancer Neg Hx   ? ? ?Social History  ? ?Socioeconomic History  ? Marital status: Single  ?  Spouse name: Not on file  ? Number of children: 3  ? Years of education: 10  ? Highest education level: Not on file  ?Occupational History  ? Occupation: disability  ?Tobacco Use  ? Smoking status: Former  ?  Packs/day: 0.50  ?  Years: 3.00  ?  Pack years: 1.50  ?  Types: Cigarettes  ?  Quit date: 20  ?  Years since quitting: 42.3  ? Smokeless  tobacco: Never  ?Vaping Use  ? Vaping Use: Never used  ?Substance and Sexual Activity  ? Alcohol use: No  ? Drug use: Yes  ?  Types: Marijuana  ? Sexual activity: Never  ?  Birth control/protection: Surgical  ?Other Topics Concern  ? Not on file  ?Social History Narrative  ?   ? Comments: Divorced x 3. Single. Has 3 children (2 boys and 1 girl), 9 grandchildren, 1 great-grandchild. Disability from arthritis in back. 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  ?   ? ?Social Determinants of Health  ? ?Financial Resource Strain: Low Risk   ? Difficulty of Paying Living Expenses: Not hard at all  ?Food Insecurity: No Food Insecurity  ? Worried About Charity fundraiser in the Last Year: Never true  ? Ran Out of Food in the Last Year:  Never true  ?Transportation Needs: No Transportation Needs  ? Lack of Transportation (Medical): No  ? Lack of Transportation (Non-Medical): No  ?Physical Activity: Insufficiently Active  ? Days of Exercise per Week: 5 days  ? Minutes of Exercise per Session: 20 min  ?Stress: No Stress Concern Present  ? Feeling of Stress : Not at all  ?Social Connections: Moderately Isolated  ? Frequency of Communication with Friends and Family: More than three times a week  ? Frequency of Social Gatherings with Friends and Family: More than three times a week  ? Attends Religious Services: More than 4 times per year  ? Active Member of Clubs or Organizations: No  ? Attends Archivist Meetings: Never  ? Marital Status: Divorced  ?Intimate Partner Violence: Not At Risk  ? Fear of Current or Ex-Partner: No  ? Emotionally Abused: No  ? Physically Abused: No  ? Sexually Abused: No  ? ? ?Outpatient Medications Prior to Visit  ?Medication Sig Dispense Refill  ? baclofen (LIORESAL) 10 MG tablet Take 1 tablet (10 mg total) by mouth 3 (three) times daily as needed for muscle spasms. 30 each 0  ? Cholecalciferol (VITAMIN D3) 125 MCG (5000 UT) CAPS Take 2 capsules by mouth daily.     ? HYDROcodone-acetaminophen (NORCO) 7.5-325 MG tablet Take 1-2 tablets by mouth every 6 (six) hours as needed for moderate pain. 30 tablet 0  ? levothyroxine (SYNTHROID) 75 MCG tablet TAKE 1 TABLET BY MOUTH DAILY 90 tablet 3  ? losartan-hydrochlorothiazide (HYZAAR) 100-12.5 MG tablet Take 1 tablet by mouth daily. 90 tablet 0  ? methocarbamol (ROBAXIN) 500 MG tablet Take 1 tablet (500 mg total) by mouth 3 (three) times daily. 40 tablet 1  ? Multiple Vitamins-Minerals (HAIR SKIN AND NAILS FORMULA) TABS Take 2 tablets by mouth daily.    ? nabumetone (RELAFEN) 750 MG tablet TAKE (1) TABLET BY MOUTH TWICE DAILY AS NEEDED. 60 tablet 0  ? RYBELSUS 7 MG TABS TAKE 1 TABLET BY MOUTH DAILY 90 tablet 3  ? ?No facility-administered medications prior to visit.   ? ? ?Allergies  ?Allergen Reactions  ? Lisinopril Swelling and Other (See Comments)  ?  Lips swelled  ? ? ?ROS ?Review of Systems  ?Constitutional:  Negative for chills and fever.  ?HENT:  Negative for congestion, sinus pressure, sinus pain and sore throat.   ?Eyes:  Negative for pain and discharge.  ?Respiratory:  Negative for cough and shortness of breath.   ?Cardiovascular:  Negative for chest pain and palpitations.  ?Gastrointestinal:  Negative for abdominal pain, constipation, diarrhea, nausea  and vomiting.  ?Endocrine: Negative for polydipsia and polyuria.  ?Genitourinary:  Negative for dysuria and hematuria.  ?Musculoskeletal:  Positive for arthralgias (R hip and knee). Negative for neck pain and neck stiffness.  ?Skin:  Negative for rash.  ?Neurological:  Negative for dizziness and weakness.  ?Psychiatric/Behavioral:  Negative for agitation and behavioral problems.   ? ?  ?Objective:  ?  ?Physical Exam ?Vitals reviewed.  ?Constitutional:   ?   General: She is not in acute distress. ?   Appearance: She is not diaphoretic.  ?HENT:  ?   Head: Normocephalic and atraumatic.  ?   Nose: Nose normal. No congestion.  ?   Mouth/Throat:  ?   Mouth: Mucous membranes are moist.  ?   Pharynx: No posterior oropharyngeal erythema.  ?Eyes:  ?   General: No scleral icterus. ?   Extraocular Movements: Extraocular movements intact.  ?Cardiovascular:  ?   Rate and Rhythm: Normal rate and regular rhythm.  ?   Pulses: Normal pulses.  ?   Heart sounds: Normal heart sounds. No murmur heard. ?Pulmonary:  ?   Breath sounds: Normal breath sounds. No wheezing or rales.  ?Abdominal:  ?   Palpations: Abdomen is soft.  ?   Tenderness: There is no abdominal tenderness.  ?Musculoskeletal:  ?   Cervical back: Neck supple. No tenderness.  ?   Right lower leg: No edema.  ?   Left lower leg: No edema.  ?Skin: ?   General: Skin is warm.  ?   Findings: No rash.  ?Neurological:  ?   General: No focal deficit present.  ?   Mental Status: She is  alert and oriented to person, place, and time.  ?   Sensory: No sensory deficit.  ?   Motor: No weakness.  ?Psychiatric:     ?   Mood and Affect: Mood normal.     ?   Behavior: Behavior normal.  ? ? ?BP 140/82 (BP

## 2021-09-20 NOTE — Assessment & Plan Note (Signed)
Lab Results  Component Value Date   TSH 0.915 04/05/2021   On levothyroxine 75 mcg daily Check TSH and free T4 

## 2021-09-22 ENCOUNTER — Other Ambulatory Visit: Payer: Self-pay | Admitting: Internal Medicine

## 2021-09-22 DIAGNOSIS — I10 Essential (primary) hypertension: Secondary | ICD-10-CM

## 2021-10-06 ENCOUNTER — Telehealth: Payer: Self-pay | Admitting: Orthopaedic Surgery

## 2021-10-06 ENCOUNTER — Other Ambulatory Visit: Payer: Self-pay | Admitting: Orthopaedic Surgery

## 2021-10-06 MED ORDER — HYDROCODONE-ACETAMINOPHEN 7.5-325 MG PO TABS: 1.0000 | ORAL_TABLET | Freq: Four times a day (QID) | ORAL | 0 refills | Status: DC | PRN

## 2021-10-06 NOTE — Telephone Encounter (Signed)
Pt called for a refill on hydrocodone.

## 2021-10-11 ENCOUNTER — Other Ambulatory Visit: Payer: Self-pay | Admitting: Physician Assistant

## 2021-10-11 DIAGNOSIS — T84030D Mechanical loosening of internal right hip prosthetic joint, subsequent encounter: Secondary | ICD-10-CM

## 2021-10-17 NOTE — Pre-Procedure Instructions (Signed)
Surgical Instructions    Your procedure is scheduled on Tuesday 10/24/21.   Report to [facility] Main Entrance "A" at 12:15 P.M., then check in with the Admitting office.  Call this number if you have problems the morning of surgery:  [phone]   If you have any questions prior to your surgery date call [phone]: Open Monday-Friday 8am-4pm    Remember:  Do not eat after midnight the night before your surgery  You may drink clear liquids until 11:15 A.M. the morning of your surgery.   Clear liquids allowed are: Water, Non-Citrus Juices (without pulp), Carbonated Beverages, Clear Tea, Black Coffee ONLY (NO MILK, CREAM OR POWDERED CREAMER of any kind), and Gatorade   Enhanced Recovery after Surgery for Orthopedics Enhanced Recovery after Surgery is a protocol used to improve the stress on your body and your recovery after surgery.  Patient Instructions The day of surgery (if you have diabetes):  Drink ONE small 10 oz bottle of water by 11:15 A.M. the morning of surgery This bottle was given to you during your hospital  pre-op appointment visit.  Nothing else to drink after completing the  Small 10 oz bottle of water.         If you have questions, please contact your surgeon's office.     Take these medicines the morning of surgery with A SIP OF WATER:   levothyroxine (SYNTHROID)  rosuvastatin (CRESTOR)    Take these medicines if needed:   HYDROcodone-acetaminophen (NORCO)  methocarbamol (ROBAXIN)   As of today, STOP taking any Aspirin (unless otherwise instructed by your surgeon) Aleve, Naproxen, Ibuprofen, Motrin, Advil, Goody's, BC's, all herbal medications, fish oil, and all vitamins.  WHAT DO I DO ABOUT MY DIABETES MEDICATION?   Do not take oral diabetes medicines (pills) the morning of surgery.  DO NOT TAKE RYBELSUS the morning of surgery.    The day of surgery, do not take other diabetes injectables, including Byetta (exenatide), Bydureon (exenatide ER),  Victoza (liraglutide), or Trulicity (dulaglutide).   HOW TO MANAGE YOUR DIABETES BEFORE AND AFTER SURGERY  Why is it important to control my blood sugar before and after surgery? Improving blood sugar levels before and after surgery helps healing and can limit problems. A way of improving blood sugar control is eating a healthy diet by:  Eating less sugar and carbohydrates  Increasing activity/exercise  Talking with your doctor about reaching your blood sugar goals High blood sugars (greater than 180 mg/dL) can raise your risk of infections and slow your recovery, so you will need to focus on controlling your diabetes during the weeks before surgery. Make sure that the doctor who takes care of your diabetes knows about your planned surgery including the date and location.  How do I manage my blood sugar before surgery? Check your blood sugar at least 4 times a day, starting 2 days before surgery, to make sure that the level is not too high or low.  Check your blood sugar the morning of your surgery when you wake up and every 2 hours until you get to the Short Stay unit.  If your blood sugar is less than 70 mg/dL, you will need to treat for low blood sugar: Do not take insulin. Treat a low blood sugar (less than 70 mg/dL) with  cup of clear juice (cranberry or apple), 4 glucose tablets, OR glucose gel. Recheck blood sugar in 15 minutes after treatment (to make sure it is greater than 70 mg/dL). If your blood sugar  is not greater than 70 mg/dL on recheck, call [phone] for further instructions. Report your blood sugar to the short stay nurse when you get to Short Stay.  If you are admitted to the hospital after surgery: Your blood sugar will be checked by the staff and you will probably be given insulin after surgery (instead of oral diabetes medicines) to make sure you have good blood sugar levels. The goal for blood sugar control after surgery is 80-180 mg/dL.           Do not wear  jewelry or makeup Do not wear lotions, powders, perfumes/colognes, or deodorant. Do not shave 48 hours prior to surgery.  Men may shave face and neck. Do not bring valuables to the hospital. Do not wear nail polish, gel polish, artificial nails, or any other type of covering on natural nails (fingers and toes) If you have artificial nails or gel coating that need to be removed by a nail salon, please have this removed prior to surgery. Artificial nails or gel coating may interfere with anesthesia's ability to adequately monitor your vital signs.  [facility] is not responsible for any belongings or valuables. .   Do NOT Smoke (Tobacco/Vaping)  24 hours prior to your procedure  If you use a CPAP at night, you may bring your mask for your overnight stay.   Contacts, glasses, hearing aids, dentures or partials may not be worn into surgery, please bring cases for these belongings   For patients admitted to the hospital, discharge time will be determined by your treatment team.   Patients discharged the day of surgery will not be allowed to drive home, and someone needs to stay with them for 24 hours.   SURGICAL WAITING ROOM VISITATION Patients having surgery or a procedure in a hospital may have two support people. Children under the age of 73 must have an adult with them who is not the patient. They may stay in the waiting area during the procedure and may switch out with other visitors. If the patient needs to stay at the hospital during part of their recovery, the visitor guidelines for inpatient rooms apply.  Please refer to the [facility] website for the visitor guidelines for Inpatients (after your surgery is over and you are in a regular room).       Special instructions:    Oral Hygiene is also important to reduce your risk of infection.  Remember - BRUSH YOUR TEETH THE MORNING OF SURGERY WITH YOUR REGULAR TOOTHPASTE   [facility]- Preparing For Surgery  Before surgery, you  can play an important role. Because skin is not sterile, your skin needs to be as free of germs as possible. You can reduce the number of germs on your skin by washing with CHG (chlorahexidine gluconate) Soap before surgery.  CHG is an antiseptic cleaner which kills germs and bonds with the skin to continue killing germs even after washing.     Please do not use if you have an allergy to CHG or antibacterial soaps. If your skin becomes reddened/irritated stop using the CHG.  Do not shave (including legs and underarms) for at least 48 hours prior to first CHG shower. It is OK to shave your face.  Please follow these instructions carefully.     Shower the NIGHT BEFORE SURGERY and the MORNING OF SURGERY with CHG Soap.   If you chose to wash your hair, wash your hair first as usual with your normal shampoo. After you shampoo,  rinse your hair and body thoroughly to remove the shampoo.  Then [employer] and genitals (private parts) with your normal soap and rinse thoroughly to remove soap.  After that Use CHG Soap as you would any other liquid soap. You can apply CHG directly to the skin and wash gently with a scrungie or a clean washcloth.   Apply the CHG Soap to your body ONLY FROM THE NECK DOWN.  Do not use on open wounds or open sores. Avoid contact with your eyes, ears, mouth and genitals (private parts). Wash Face and genitals (private parts)  with your normal soap.   Wash thoroughly, paying special attention to the area where your surgery will be performed.  Thoroughly rinse your body with warm water from the neck down.  DO NOT shower/wash with your normal soap after using and rinsing off the CHG Soap.  Pat yourself dry with a CLEAN TOWEL.  Wear CLEAN PAJAMAS to bed the night before surgery  Place CLEAN SHEETS on your bed the night before your surgery  DO NOT SLEEP WITH PETS.   Day of Surgery:  Take a shower with CHG soap. Wear Clean/Comfortable clothing the morning of surgery Do  not apply any deodorants/lotions.   Remember to brush your teeth WITH YOUR REGULAR TOOTHPASTE.    If you received a COVID test during your pre-op visit, it is requested that you wear a mask when out in public, stay away from anyone that may not be feeling well, and notify your surgeon if you develop symptoms. If you have been in contact with anyone that has tested positive in the last 10 days, please notify your surgeon.    Please read over the following fact sheets that you were given.

## 2021-10-18 ENCOUNTER — Encounter (HOSPITAL_COMMUNITY)
Admission: RE | Admit: 2021-10-18 | Discharge: 2021-10-18 | Disposition: A | Discharge status: Home / Self Care | Payer: Medicare Other | Source: Ambulatory Visit | Attending: Orthopaedic Surgery | Admitting: Orthopaedic Surgery

## 2021-10-18 ENCOUNTER — Encounter (HOSPITAL_COMMUNITY): Payer: Self-pay

## 2021-10-18 VITALS — BP 156/81 | HR 70 | Temp 98.2°F | Resp 17 | Ht 66.0 in | Wt 192.1 lb

## 2021-10-18 DIAGNOSIS — X58XXXD Exposure to other specified factors, subsequent encounter: Secondary | ICD-10-CM | POA: Diagnosis not present

## 2021-10-18 DIAGNOSIS — Z6831 Body mass index (BMI) 31.0-31.9, adult: Secondary | ICD-10-CM | POA: Insufficient documentation

## 2021-10-18 DIAGNOSIS — I10 Essential (primary) hypertension: Secondary | ICD-10-CM | POA: Insufficient documentation

## 2021-10-18 DIAGNOSIS — Z7985 Long-term (current) use of injectable non-insulin antidiabetic drugs: Secondary | ICD-10-CM | POA: Diagnosis not present

## 2021-10-18 DIAGNOSIS — T84030D Mechanical loosening of internal right hip prosthetic joint, subsequent encounter: Secondary | ICD-10-CM

## 2021-10-18 DIAGNOSIS — Z01818 Encounter for other preprocedural examination: Secondary | ICD-10-CM | POA: Diagnosis not present

## 2021-10-18 DIAGNOSIS — E1169 Type 2 diabetes mellitus with other specified complication: Secondary | ICD-10-CM | POA: Insufficient documentation

## 2021-10-18 DIAGNOSIS — E1136 Type 2 diabetes mellitus with diabetic cataract: Secondary | ICD-10-CM | POA: Diagnosis not present

## 2021-10-18 DIAGNOSIS — E669 Obesity, unspecified: Secondary | ICD-10-CM | POA: Diagnosis not present

## 2021-10-18 LAB — BASIC METABOLIC PANEL
Anion gap: 7 (ref 5–15)
BUN: 8 mg/dL (ref 8–23)
CO2: 27 mmol/L (ref 22–32)
Calcium: 9.5 mg/dL (ref 8.9–10.3)
Chloride: 105 mmol/L (ref 98–111)
Creatinine, Ser: 0.67 mg/dL (ref 0.44–1.00)
GFR, Estimated: >60 mL/min (ref >60)
Glucose, Bld: 107 mg/dL — ABNORMAL HIGH (ref 70–99)
Potassium: 3.5 mmol/L (ref 3.5–5.1)
Sodium: 139 mmol/L (ref 135–145)

## 2021-10-18 LAB — SURGICAL PCR SCREEN
MRSA, PCR: NEGATIVE
Staphylococcus aureus: NEGATIVE

## 2021-10-18 LAB — CBC
HCT: 39 % (ref 36.0–46.0)
Hemoglobin: 12 g/dL (ref 12.0–15.0)
MCH: 26.8 pg (ref 26.0–34.0)
MCHC: 30.8 g/dL (ref 30.0–36.0)
MCV: 87.2 fL (ref 80.0–100.0)
Platelets: 334 10*3/uL (ref 150–400)
RBC: 4.47 MIL/uL (ref 3.87–5.11)
RDW: 13 % (ref 11.5–15.5)
WBC: 7.3 10*3/uL (ref 4.0–10.5)
nRBC: 0 % (ref 0.0–0.2)

## 2021-10-18 LAB — TYPE AND SCREEN
ABO/RH(D): O POS
Antibody Screen: NEGATIVE

## 2021-10-18 LAB — HEMOGLOBIN A1C
Hgb A1c MFr Bld: 6.3 % — ABNORMAL HIGH (ref 4.8–5.6)
Mean Plasma Glucose: 134.11 mg/dL

## 2021-10-18 LAB — GLUCOSE, CAPILLARY: Glucose-Capillary: 114 mg/dL — ABNORMAL HIGH (ref 70–99)

## 2021-10-18 NOTE — Progress Notes (Signed)
PCP - Dr. Ihor Dow Cardiologist - Denies  PPM/ICD - Denies  Chest x-ray - N/A EKG - 10/18/21 Stress Test - Denies ECHO - Denies Cardiac Cath - Denies  Sleep Study - Denies  Patient is diabetic but does not check her blood sugars.  Blood Thinner Instructions: N/A Aspirin Instructions: N/A  ERAS Protcol - Yes, G2  COVID TEST- N/A   Anesthesia review: Yes, abnormal EKG  Patient denies shortness of breath, fever, cough and chest pain at PAT appointment   All instructions explained to the patient, with a verbal understanding of the material. Patient agrees to go over the instructions while at home for a better understanding. Patient also instructed to self quarantine after being tested for COVID-19. The opportunity to ask questions was provided.

## 2021-10-19 NOTE — Progress Notes (Signed)
Anesthesia Chart Review:   Case: [id number] Date/Time: 10/24/21 1402   Procedure: RIGHT ANTERIOR HIP REVISION (Right: Hip)   Anesthesia type: Choice   Pre-op diagnosis: LOOSENING RIGHT HIP FEMORAL COMPONENT   Location: [facility] OR ROOM 07 / [facility]   Surgeons: Mcarthur Rossetti, MD       DISCUSSION: Pt is 66 years old with hx HTN, DM  VS: BP (!) 156/81   Pulse 70   Temp 36.8 C (Oral)   Resp 17   Ht '5\' 6"'$  (1.676 m)   Wt 87.1 kg   SpO2 100%   BMI 31.01 kg/m   PROVIDERS: - PCP is Lindell Spar, MD   LABS: Labs reviewed: Acceptable for surgery. (all labs ordered are listed, but only abnormal results are displayed)  Labs Reviewed  GLUCOSE, CAPILLARY - Abnormal; Notable for the following components:      Result Value   Glucose-Capillary 114 (*)    All other components within normal limits  BASIC METABOLIC PANEL - Abnormal; Notable for the following components:   Glucose, Bld 107 (*)    All other components within normal limits  HEMOGLOBIN A1C - Abnormal; Notable for the following components:   Hgb A1c MFr Bld 6.3 (*)    All other components within normal limits  SURGICAL PCR SCREEN  CBC  TYPE AND SCREEN    EKG 10/18/21:NSR. Minimal voltage criteria for LVH, may be normal variant ( R in aVL ). Possible Anterior infarct , age undetermined. Appears stable compared to prior EKG 07/26/17   CV: N/A  Past Medical History:  Diagnosis Date   Arthritis    knee , ankle and back   Bronchitis    Cataract    Diabetes mellitus without complication ([place]) 02/02/7828   Hyperlipidemia    Hypertension    Hypothyroidism, adult 02/10/2019   Neuralgia, post-herpetic 11/13/2016   Obesity (BMI 30.0-34.9) 02/10/2019   Thyroid disease    Vitamin D deficiency disease 02/10/2019    Past Surgical History:  Procedure Laterality Date   ABDOMINAL HYSTERECTOMY     fibroid   BREAST BIOPSY Right 07/31/2017   Procedure: EXCISIONAL BREAST BIOPSY WITH NEEDLE LOCALIZATION;  Surgeon: Virl Cagey, MD;  Location: [facility]S;  Service: General;  Laterality: Right;   BREAST SURGERY Right    COLONOSCOPY  09/13/2011   Procedure: COLONOSCOPY;  Surgeon: Rogene Houston, MD;  Location: [facility] ENDO SUITE;  Service: Endoscopy;  Laterality: N/A;  1030   COLONOSCOPY N/A 07/31/2018   Procedure: COLONOSCOPY;  Surgeon: Rogene Houston, MD;  Location: [facility];  Service: Endoscopy;  Laterality: N/A;   POLYPECTOMY  07/31/2018   Procedure: POLYPECTOMY;  Surgeon: Rogene Houston, MD;  Location: [facility];  Service: Endoscopy;;   TOTAL HIP ARTHROPLASTY Right 12/13/2017   Procedure: RIGHT TOTAL HIP ARTHROPLASTY ANTERIOR APPROACH;  Surgeon: Mcarthur Rossetti, MD;  Location: [facility] ORS;  Service: Orthopedics;  Laterality: Right;    MEDICATIONS:  baclofen (LIORESAL) 10 MG tablet   Cholecalciferol (VITAMIN D3) 125 MCG (5000 UT) CAPS   HYDROcodone-acetaminophen (NORCO) 7.5-325 MG tablet   levothyroxine (SYNTHROID) 75 MCG tablet   losartan-hydrochlorothiazide (HYZAAR) 100-12.5 MG tablet   methocarbamol (ROBAXIN) 500 MG tablet   Multiple Vitamins-Minerals (HAIR SKIN AND NAILS FORMULA) TABS   rosuvastatin (CRESTOR) 5 MG tablet   RYBELSUS 7 MG TABS   No current facility-administered medications for this encounter.    If no changes, I anticipate pt can proceed with surgery as scheduled.  Willeen Cass, PhD, FNP-BC [facility]/Anesthesiology Phone: [phone] 10/19/2021 11:26 AM

## 2021-10-19 NOTE — Anesthesia Preprocedure Evaluation (Addendum)
Anesthesia Evaluation  Patient identified by MRN, date of birth, ID band Patient awake    Reviewed: Allergy & Precautions, NPO status , Patient's Chart, lab work & pertinent test results  History of Anesthesia Complications Negative for: history of anesthetic complications  Airway Mallampati: I  TM Distance: >3 FB Neck ROM: Full    Dental  (+) Dental Advisory Given, Lower Dentures, Upper Dentures   Pulmonary former smoker,    Pulmonary exam normal breath sounds clear to auscultation       Cardiovascular hypertension, Normal cardiovascular exam Rhythm:Regular Rate:Normal     Neuro/Psych negative neurological ROS  negative psych ROS   GI/Hepatic negative GI ROS, Neg liver ROS,   Endo/Other  diabetes, Type 2, Oral Hypoglycemic AgentsHypothyroidism Obesity   Renal/GU negative Renal ROS     Musculoskeletal negative musculoskeletal ROS (+)   Abdominal   Peds  Hematology negative hematology ROS (+)   Anesthesia Other Findings Day of surgery medications reviewed with the patient.  Reproductive/Obstetrics                            Anesthesia Physical Anesthesia Plan  ASA: 2  Anesthesia Plan: General   Post-op Pain Management: Tylenol PO (pre-op)*, Toradol IV (intra-op)* and Ketamine IV*   Induction: Intravenous  PONV Risk Score and Plan: 3 and Midazolam, Dexamethasone and Ondansetron  Airway Management Planned: Oral ETT  Additional Equipment:   Intra-op Plan:   Post-operative Plan: Extubation in OR  Informed Consent: I have reviewed the patients History and Physical, chart, labs and discussed the procedure including the risks, benefits and alternatives for the proposed anesthesia with the patient or authorized representative who has indicated his/her understanding and acceptance.     Dental advisory given  Plan Discussed with: CRNA  Anesthesia Plan Comments: (See APP note by  Durel Salts, FNP )       Anesthesia Quick Evaluation

## 2021-10-23 ENCOUNTER — Telehealth: Payer: Self-pay

## 2021-10-23 NOTE — Telephone Encounter (Signed)
FYI Patient called to let provider know she is having her right hip surgery tomorrow on her right hip at [facility] / Dr Ninfa Linden.

## 2021-10-24 ENCOUNTER — Encounter (HOSPITAL_COMMUNITY): Payer: Self-pay | Admitting: Orthopaedic Surgery

## 2021-10-24 ENCOUNTER — Other Ambulatory Visit: Payer: Self-pay

## 2021-10-24 ENCOUNTER — Inpatient Hospital Stay (HOSPITAL_COMMUNITY): Payer: Medicare Other

## 2021-10-24 ENCOUNTER — Inpatient Hospital Stay (HOSPITAL_COMMUNITY): Payer: Medicare Other | Admitting: Emergency Medicine

## 2021-10-24 ENCOUNTER — Encounter (HOSPITAL_COMMUNITY)
Admission: RE | Disposition: A | Discharge status: Home / Self Care | Payer: Self-pay | Source: Home / Self Care | Attending: Orthopaedic Surgery

## 2021-10-24 ENCOUNTER — Inpatient Hospital Stay (HOSPITAL_COMMUNITY): Payer: Medicare Other | Admitting: Certified Registered Nurse Anesthetist

## 2021-10-24 ENCOUNTER — Inpatient Hospital Stay (HOSPITAL_COMMUNITY)
Admission: RE | Admit: 2021-10-24 | Discharge: 2021-10-27 | DRG: 468 | Disposition: A | Discharge status: Home / Self Care | Payer: Medicare Other | Attending: Orthopaedic Surgery | Admitting: Orthopaedic Surgery

## 2021-10-24 DIAGNOSIS — Z87891 Personal history of nicotine dependence: Secondary | ICD-10-CM

## 2021-10-24 DIAGNOSIS — E669 Obesity, unspecified: Secondary | ICD-10-CM | POA: Diagnosis present

## 2021-10-24 DIAGNOSIS — Z888 Allergy status to other drugs, medicaments and biological substances status: Secondary | ICD-10-CM

## 2021-10-24 DIAGNOSIS — E119 Type 2 diabetes mellitus without complications: Secondary | ICD-10-CM | POA: Diagnosis present

## 2021-10-24 DIAGNOSIS — Z6831 Body mass index (BMI) 31.0-31.9, adult: Secondary | ICD-10-CM | POA: Diagnosis not present

## 2021-10-24 DIAGNOSIS — E559 Vitamin D deficiency, unspecified: Secondary | ICD-10-CM | POA: Diagnosis present

## 2021-10-24 DIAGNOSIS — Y792 Prosthetic and other implants, materials and accessory orthopedic devices associated with adverse incidents: Secondary | ICD-10-CM | POA: Diagnosis present

## 2021-10-24 DIAGNOSIS — Z89621 Acquired absence of right hip joint: Secondary | ICD-10-CM | POA: Diagnosis not present

## 2021-10-24 DIAGNOSIS — Z96641 Presence of right artificial hip joint: Secondary | ICD-10-CM | POA: Diagnosis not present

## 2021-10-24 DIAGNOSIS — I10 Essential (primary) hypertension: Secondary | ICD-10-CM | POA: Diagnosis not present

## 2021-10-24 DIAGNOSIS — L80 Vitiligo: Secondary | ICD-10-CM | POA: Diagnosis present

## 2021-10-24 DIAGNOSIS — M545 Low back pain, unspecified: Secondary | ICD-10-CM | POA: Diagnosis not present

## 2021-10-24 DIAGNOSIS — Z79899 Other long term (current) drug therapy: Secondary | ICD-10-CM

## 2021-10-24 DIAGNOSIS — Z90710 Acquired absence of both cervix and uterus: Secondary | ICD-10-CM

## 2021-10-24 DIAGNOSIS — Z7984 Long term (current) use of oral hypoglycemic drugs: Secondary | ICD-10-CM | POA: Diagnosis not present

## 2021-10-24 DIAGNOSIS — E039 Hypothyroidism, unspecified: Secondary | ICD-10-CM | POA: Diagnosis present

## 2021-10-24 DIAGNOSIS — Z79890 Hormone replacement therapy (postmenopausal): Secondary | ICD-10-CM | POA: Diagnosis not present

## 2021-10-24 DIAGNOSIS — T84030D Mechanical loosening of internal right hip prosthetic joint, subsequent encounter: Secondary | ICD-10-CM

## 2021-10-24 DIAGNOSIS — T84030A Mechanical loosening of internal right hip prosthetic joint, initial encounter: Secondary | ICD-10-CM

## 2021-10-24 DIAGNOSIS — Z96649 Presence of unspecified artificial hip joint: Secondary | ICD-10-CM

## 2021-10-24 DIAGNOSIS — E1169 Type 2 diabetes mellitus with other specified complication: Secondary | ICD-10-CM

## 2021-10-24 DIAGNOSIS — E785 Hyperlipidemia, unspecified: Secondary | ICD-10-CM | POA: Diagnosis not present

## 2021-10-24 DIAGNOSIS — G8929 Other chronic pain: Secondary | ICD-10-CM | POA: Diagnosis present

## 2021-10-24 DIAGNOSIS — Z471 Aftercare following joint replacement surgery: Secondary | ICD-10-CM | POA: Diagnosis not present

## 2021-10-24 HISTORY — PX: ANTERIOR HIP REVISION: SHX6527

## 2021-10-24 LAB — GLUCOSE, CAPILLARY
Glucose-Capillary: 74 mg/dL (ref 70–99)
Glucose-Capillary: 125 mg/dL — ABNORMAL HIGH (ref 70–99)
Glucose-Capillary: 68 mg/dL — ABNORMAL LOW (ref 70–99)
Glucose-Capillary: 115 mg/dL — ABNORMAL HIGH (ref 70–99)
Glucose-Capillary: 154 mg/dL — ABNORMAL HIGH (ref 70–99)

## 2021-10-24 LAB — ABO/RH: ABO/RH(D): O POS

## 2021-10-24 SURGERY — REVISION, TOTAL ARTHROPLASTY, HIP, ANTERIOR APPROACH
Anesthesia: General | Site: Hip | Laterality: Right

## 2021-10-24 MED ORDER — VITAMIN D3 125 MCG (5000 UT) PO CAPS
2.0000 | ORAL_CAPSULE | Freq: Every day | ORAL | Status: DC
Start: 2021-10-24 — End: 2021-10-24

## 2021-10-24 MED ORDER — HYDROMORPHONE HCL 1 MG/ML IJ SOLN
INTRAMUSCULAR | Status: AC
  Filled 2021-10-24: qty 1

## 2021-10-24 MED ORDER — MIDAZOLAM HCL 2 MG/2ML IJ SOLN
INTRAMUSCULAR | Status: DC | PRN
  Administered 2021-10-24: 2 mg via INTRAVENOUS

## 2021-10-24 MED ORDER — PROPOFOL 10 MG/ML IV BOLUS
INTRAVENOUS | Status: DC | PRN
  Administered 2021-10-24: 160 mg via INTRAVENOUS

## 2021-10-24 MED ORDER — ACETAMINOPHEN 500 MG PO TABS
ORAL_TABLET | ORAL | Status: AC
  Administered 2021-10-24: 1000 mg via ORAL
  Filled 2021-10-24: qty 2

## 2021-10-24 MED ORDER — FENTANYL CITRATE (PF) 100 MCG/2ML IJ SOLN: 25.0000 ug | INTRAMUSCULAR | Status: DC | PRN

## 2021-10-24 MED ORDER — METHOCARBAMOL 1000 MG/10ML IJ SOLN: 500.0000 mg | Freq: Four times a day (QID) | INTRAVENOUS | Status: DC | PRN

## 2021-10-24 MED ORDER — ACETAMINOPHEN 500 MG PO TABS: 1000.0000 mg | ORAL_TABLET | Freq: Once | ORAL | Status: AC

## 2021-10-24 MED ORDER — ONDANSETRON HCL 4 MG/2ML IJ SOLN
INTRAMUSCULAR | Status: AC
  Filled 2021-10-24: qty 2

## 2021-10-24 MED ORDER — ACETAMINOPHEN 325 MG PO TABS: 325.0000 mg | ORAL_TABLET | Freq: Four times a day (QID) | ORAL | Status: DC | PRN

## 2021-10-24 MED ORDER — HYDROMORPHONE HCL 1 MG/ML IJ SOLN
0.2500 mg | INTRAMUSCULAR | Status: DC | PRN
  Administered 2021-10-24 (×4): 0.5 mg via INTRAVENOUS

## 2021-10-24 MED ORDER — SEMAGLUTIDE 7 MG PO TABS
1.0000 | ORAL_TABLET | Freq: Every day | ORAL | Status: DC
Start: 2021-10-24 — End: 2021-10-24

## 2021-10-24 MED ORDER — ROCURONIUM BROMIDE 10 MG/ML (PF) SYRINGE
PREFILLED_SYRINGE | INTRAVENOUS | Status: DC | PRN
  Administered 2021-10-24: 30 mg via INTRAVENOUS
  Administered 2021-10-24: 60 mg via INTRAVENOUS

## 2021-10-24 MED ORDER — METOCLOPRAMIDE HCL 5 MG/ML IJ SOLN: 5.0000 mg | Freq: Three times a day (TID) | INTRAMUSCULAR | Status: DC | PRN

## 2021-10-24 MED ORDER — POVIDONE-IODINE 10 % EX SWAB
2.0000 "application " | Freq: Once | CUTANEOUS | Status: AC
  Administered 2021-10-24: 2 via TOPICAL

## 2021-10-24 MED ORDER — DEXTROSE 50 % IV SOLN
INTRAVENOUS | Status: AC
  Filled 2021-10-24: qty 50

## 2021-10-24 MED ORDER — SODIUM CHLORIDE 0.9 % IV SOLN: INTRAVENOUS | Status: DC

## 2021-10-24 MED ORDER — LOSARTAN POTASSIUM-HCTZ 100-12.5 MG PO TABS: 1.0000 | ORAL_TABLET | Freq: Every day | ORAL | Status: DC

## 2021-10-24 MED ORDER — DEXAMETHASONE SODIUM PHOSPHATE 10 MG/ML IJ SOLN
INTRAMUSCULAR | Status: DC | PRN
  Administered 2021-10-24: 10 mg via INTRAVENOUS

## 2021-10-24 MED ORDER — MIDAZOLAM HCL 2 MG/2ML IJ SOLN
INTRAMUSCULAR | Status: AC
  Filled 2021-10-24: qty 2

## 2021-10-24 MED ORDER — HYDROCHLOROTHIAZIDE 12.5 MG PO TABS
12.5000 mg | ORAL_TABLET | Freq: Every day | ORAL | Status: DC
  Administered 2021-10-25 – 2021-10-27 (×3): 12.5 mg via ORAL
  Filled 2021-10-24 (×3): qty 1

## 2021-10-24 MED ORDER — ASPIRIN 81 MG PO CHEW
81.0000 mg | CHEWABLE_TABLET | Freq: Two times a day (BID) | ORAL | Status: DC
Start: 2021-10-24 — End: 2021-10-28
  Administered 2021-10-24 – 2021-10-27 (×6): 81 mg via ORAL
  Filled 2021-10-24 (×6): qty 1

## 2021-10-24 MED ORDER — EPHEDRINE SULFATE-NACL 50-0.9 MG/10ML-% IV SOSY
PREFILLED_SYRINGE | INTRAVENOUS | Status: DC | PRN
  Administered 2021-10-24: 10 mg via INTRAVENOUS

## 2021-10-24 MED ORDER — HYDROMORPHONE HCL 1 MG/ML IJ SOLN
0.5000 mg | INTRAMUSCULAR | Status: DC | PRN
  Administered 2021-10-25: 1 mg via INTRAVENOUS
  Filled 2021-10-24: qty 1

## 2021-10-24 MED ORDER — ROCURONIUM BROMIDE 10 MG/ML (PF) SYRINGE
PREFILLED_SYRINGE | INTRAVENOUS | Status: AC
Start: 2021-10-24 — End: ?
  Filled 2021-10-24: qty 10

## 2021-10-24 MED ORDER — LIDOCAINE 2% (20 MG/ML) 5 ML SYRINGE
INTRAMUSCULAR | Status: DC | PRN
  Administered 2021-10-24: 80 mg via INTRAVENOUS

## 2021-10-24 MED ORDER — PHENYLEPHRINE 80 MCG/ML (10ML) SYRINGE FOR IV PUSH (FOR BLOOD PRESSURE SUPPORT)
PREFILLED_SYRINGE | INTRAVENOUS | Status: DC | PRN
  Administered 2021-10-24 (×2): 80 ug via INTRAVENOUS
  Administered 2021-10-24: 160 ug via INTRAVENOUS

## 2021-10-24 MED ORDER — LIDOCAINE 2% (20 MG/ML) 5 ML SYRINGE
INTRAMUSCULAR | Status: AC
  Filled 2021-10-24: qty 5

## 2021-10-24 MED ORDER — PHENYLEPHRINE HCL-NACL 20-0.9 MG/250ML-% IV SOLN
INTRAVENOUS | Status: DC | PRN
  Administered 2021-10-24: 20 ug/min via INTRAVENOUS

## 2021-10-24 MED ORDER — CEFAZOLIN SODIUM-DEXTROSE 2-4 GM/100ML-% IV SOLN
2.0000 g | INTRAVENOUS | Status: AC
  Administered 2021-10-24: 2 g via INTRAVENOUS
  Filled 2021-10-24: qty 100

## 2021-10-24 MED ORDER — OXYCODONE HCL 5 MG PO TABS
5.0000 mg | ORAL_TABLET | ORAL | Status: DC | PRN
  Administered 2021-10-24 – 2021-10-27 (×10): 10 mg via ORAL
  Filled 2021-10-24 (×10): qty 2

## 2021-10-24 MED ORDER — DEXTROSE 50 % IV SOLN
12.5000 g | INTRAVENOUS | Status: AC
  Administered 2021-10-24: 12.5 g via INTRAVENOUS

## 2021-10-24 MED ORDER — DIPHENHYDRAMINE HCL 12.5 MG/5ML PO ELIX: 12.5000 mg | ORAL_SOLUTION | ORAL | Status: DC | PRN

## 2021-10-24 MED ORDER — HAIR SKIN AND NAILS FORMULA PO TABS: 2.0000 | ORAL_TABLET | Freq: Every day | ORAL | Status: DC

## 2021-10-24 MED ORDER — ONDANSETRON HCL 4 MG/2ML IJ SOLN
4.0000 mg | Freq: Once | INTRAMUSCULAR | Status: AC | PRN
  Administered 2021-10-24: 4 mg via INTRAVENOUS

## 2021-10-24 MED ORDER — SODIUM CHLORIDE 0.9 % IR SOLN
Status: DC | PRN
  Administered 2021-10-24: 3000 mL

## 2021-10-24 MED ORDER — CEFAZOLIN SODIUM-DEXTROSE 1-4 GM/50ML-% IV SOLN
1.0000 g | Freq: Four times a day (QID) | INTRAVENOUS | Status: AC
  Administered 2021-10-24 – 2021-10-25 (×2): 1 g via INTRAVENOUS
  Filled 2021-10-24 (×2): qty 50

## 2021-10-24 MED ORDER — LEVOTHYROXINE SODIUM 75 MCG PO TABS
75.0000 ug | ORAL_TABLET | Freq: Every day | ORAL | Status: DC
  Administered 2021-10-25 – 2021-10-27 (×3): 75 ug via ORAL
  Filled 2021-10-24 (×3): qty 1

## 2021-10-24 MED ORDER — CHLORHEXIDINE GLUCONATE 0.12 % MT SOLN
15.0000 mL | Freq: Once | OROMUCOSAL | Status: AC
  Administered 2021-10-24: 15 mL via OROMUCOSAL
  Filled 2021-10-24: qty 15

## 2021-10-24 MED ORDER — PROPOFOL 10 MG/ML IV BOLUS
INTRAVENOUS | Status: AC
  Filled 2021-10-24: qty 20

## 2021-10-24 MED ORDER — KETAMINE HCL 10 MG/ML IJ SOLN
INTRAMUSCULAR | Status: DC | PRN
  Administered 2021-10-24: 40 mg via INTRAVENOUS

## 2021-10-24 MED ORDER — PHENOL 1.4 % MT LIQD
1.0000 | OROMUCOSAL | Status: DC | PRN
Start: 2021-10-24 — End: 2021-10-28

## 2021-10-24 MED ORDER — PROPOFOL 500 MG/50ML IV EMUL
INTRAVENOUS | Status: DC | PRN
  Administered 2021-10-24: 50 ug/kg/min via INTRAVENOUS

## 2021-10-24 MED ORDER — FENTANYL CITRATE (PF) 250 MCG/5ML IJ SOLN
INTRAMUSCULAR | Status: AC
  Filled 2021-10-24: qty 5

## 2021-10-24 MED ORDER — LOSARTAN POTASSIUM 50 MG PO TABS
100.0000 mg | ORAL_TABLET | Freq: Every day | ORAL | Status: DC
  Administered 2021-10-25 – 2021-10-27 (×3): 100 mg via ORAL
  Filled 2021-10-24 (×3): qty 2

## 2021-10-24 MED ORDER — ALUM & MAG HYDROXIDE-SIMETH 200-200-20 MG/5ML PO SUSP: 30.0000 mL | ORAL | Status: DC | PRN

## 2021-10-24 MED ORDER — METHOCARBAMOL 500 MG PO TABS
500.0000 mg | ORAL_TABLET | Freq: Four times a day (QID) | ORAL | Status: DC | PRN
  Administered 2021-10-24 – 2021-10-27 (×7): 500 mg via ORAL
  Filled 2021-10-24 (×7): qty 1

## 2021-10-24 MED ORDER — FENTANYL CITRATE (PF) 100 MCG/2ML IJ SOLN
INTRAMUSCULAR | Status: AC
  Filled 2021-10-24: qty 2

## 2021-10-24 MED ORDER — LACTATED RINGERS IV SOLN: INTRAVENOUS | Status: DC

## 2021-10-24 MED ORDER — 0.9 % SODIUM CHLORIDE (POUR BTL) OPTIME
TOPICAL | Status: DC | PRN
  Administered 2021-10-24: 1000 mL

## 2021-10-24 MED ORDER — FENTANYL CITRATE (PF) 250 MCG/5ML IJ SOLN
INTRAMUSCULAR | Status: DC | PRN
  Administered 2021-10-24: 100 ug via INTRAVENOUS
  Administered 2021-10-24: 50 ug via INTRAVENOUS
  Administered 2021-10-24: 150 ug via INTRAVENOUS
  Administered 2021-10-24: 50 ug via INTRAVENOUS

## 2021-10-24 MED ORDER — MENTHOL 3 MG MT LOZG: 1.0000 | LOZENGE | OROMUCOSAL | Status: DC | PRN

## 2021-10-24 MED ORDER — METOCLOPRAMIDE HCL 5 MG PO TABS: 5.0000 mg | ORAL_TABLET | Freq: Three times a day (TID) | ORAL | Status: DC | PRN

## 2021-10-24 MED ORDER — ROSUVASTATIN CALCIUM 5 MG PO TABS
5.0000 mg | ORAL_TABLET | Freq: Every day | ORAL | Status: DC
  Administered 2021-10-25 – 2021-10-27 (×3): 5 mg via ORAL
  Filled 2021-10-24 (×3): qty 1

## 2021-10-24 MED ORDER — DOCUSATE SODIUM 100 MG PO CAPS
100.0000 mg | ORAL_CAPSULE | Freq: Two times a day (BID) | ORAL | Status: DC
  Administered 2021-10-24 – 2021-10-27 (×6): 100 mg via ORAL
  Filled 2021-10-24 (×7): qty 1

## 2021-10-24 MED ORDER — ONDANSETRON HCL 4 MG/2ML IJ SOLN: 4.0000 mg | Freq: Four times a day (QID) | INTRAMUSCULAR | Status: DC | PRN

## 2021-10-24 MED ORDER — FENTANYL CITRATE (PF) 100 MCG/2ML IJ SOLN
25.0000 ug | INTRAMUSCULAR | Status: DC | PRN
  Administered 2021-10-24 (×3): 50 ug via INTRAVENOUS

## 2021-10-24 MED ORDER — KETAMINE HCL 50 MG/5ML IJ SOSY
PREFILLED_SYRINGE | INTRAMUSCULAR | Status: AC
  Filled 2021-10-24: qty 5

## 2021-10-24 MED ORDER — INSULIN ASPART 100 UNIT/ML IJ SOLN: 0.0000 [IU] | INTRAMUSCULAR | Status: DC | PRN

## 2021-10-24 MED ORDER — DEXAMETHASONE SODIUM PHOSPHATE 10 MG/ML IJ SOLN
INTRAMUSCULAR | Status: AC
Start: 2021-10-24 — End: ?
  Filled 2021-10-24: qty 1

## 2021-10-24 MED ORDER — ONDANSETRON HCL 4 MG PO TABS: 4.0000 mg | ORAL_TABLET | Freq: Four times a day (QID) | ORAL | Status: DC | PRN

## 2021-10-24 MED ORDER — ONDANSETRON HCL 4 MG/2ML IJ SOLN
INTRAMUSCULAR | Status: DC | PRN
  Administered 2021-10-24: 4 mg via INTRAVENOUS

## 2021-10-24 MED ORDER — HYDROMORPHONE HCL 2 MG PO TABS
2.0000 mg | ORAL_TABLET | ORAL | Status: DC | PRN
  Administered 2021-10-24 – 2021-10-25 (×5): 3 mg via ORAL
  Filled 2021-10-24 (×5): qty 2

## 2021-10-24 MED ORDER — TRANEXAMIC ACID-NACL 1000-0.7 MG/100ML-% IV SOLN
1000.0000 mg | INTRAVENOUS | Status: AC
  Administered 2021-10-24: 1000 mg via INTRAVENOUS
  Filled 2021-10-24: qty 100

## 2021-10-24 MED ORDER — ORAL CARE MOUTH RINSE: 15.0000 mL | Freq: Once | OROMUCOSAL | Status: AC

## 2021-10-24 MED ORDER — PANTOPRAZOLE SODIUM 40 MG PO TBEC
40.0000 mg | DELAYED_RELEASE_TABLET | Freq: Every day | ORAL | Status: DC
  Administered 2021-10-25 – 2021-10-27 (×3): 40 mg via ORAL
  Filled 2021-10-24 (×3): qty 1

## 2021-10-24 SURGICAL SUPPLY — 48 items
BAG COUNTER SPONGE SURGICOUNT (BAG) ×3 IMPLANT
BAG SPNG CNTER NS LX DISP (BAG) ×1
COVER SURGICAL LIGHT HANDLE (MISCELLANEOUS) ×3 IMPLANT
DRAPE C-ARM 42X72 X-RAY (DRAPES) ×3 IMPLANT
DRAPE STERI IOBAN 125X83 (DRAPES) ×3 IMPLANT
DRAPE U-SHAPE 47X51 STRL (DRAPES) ×9 IMPLANT
DRSG AQUACEL AG ADV 3.5X10 (GAUZE/BANDAGES/DRESSINGS) ×3 IMPLANT
DURAPREP 26ML APPLICATOR (WOUND CARE) ×3 IMPLANT
ELECT BLADE 4.0 EZ CLEAN MEGAD (MISCELLANEOUS) ×2
ELECT REM PT RETURN 9FT ADLT (ELECTROSURGICAL) ×2
ELECTRODE BLDE 4.0 EZ CLN MEGD (MISCELLANEOUS) ×2 IMPLANT
ELECTRODE REM PT RTRN 9FT ADLT (ELECTROSURGICAL) ×2 IMPLANT
FACESHIELD WRAPAROUND (MASK) ×6 IMPLANT
FACESHIELD WRAPAROUND OR TEAM (MASK) ×4 IMPLANT
GLOVE BIOGEL PI IND STRL 8 (GLOVE) ×4 IMPLANT
GLOVE BIOGEL PI INDICATOR 8 (GLOVE) ×2
GLOVE ECLIPSE 8.0 STRL XLNG CF (GLOVE) ×3 IMPLANT
GLOVE ORTHO TXT STRL SZ7.5 (GLOVE) ×6 IMPLANT
GOWN STRL REUS W/ TWL LRG LVL3 (GOWN DISPOSABLE) ×4 IMPLANT
GOWN STRL REUS W/ TWL XL LVL3 (GOWN DISPOSABLE) ×4 IMPLANT
GOWN STRL REUS W/TWL LRG LVL3 (GOWN DISPOSABLE) ×4
GOWN STRL REUS W/TWL XL LVL3 (GOWN DISPOSABLE) ×4
HANDPIECE INTERPULSE COAX TIP (DISPOSABLE) ×2
HEAD FEMORAL 32 CERAMIC (Hips) ×1 IMPLANT
KIT BASIN OR (CUSTOM PROCEDURE TRAY) ×3 IMPLANT
KIT TURNOVER KIT B (KITS) ×3 IMPLANT
LINER ACET PNNCL PLUS4 NEUTRAL (Hips) IMPLANT
MANIFOLD NEPTUNE II (INSTRUMENTS) ×3 IMPLANT
NS IRRIG 1000ML POUR BTL (IV SOLUTION) ×3 IMPLANT
PACK TOTAL JOINT (CUSTOM PROCEDURE TRAY) ×3 IMPLANT
PAD ARMBOARD 7.5X6 YLW CONV (MISCELLANEOUS) ×3 IMPLANT
PINNACLE PLUS 4 NEUTRAL (Hips) ×2 IMPLANT
SET HNDPC FAN SPRY TIP SCT (DISPOSABLE) ×2 IMPLANT
STAPLER VISISTAT 35W (STAPLE) ×1 IMPLANT
STEM FEM ACTIS HIGH SZ7 (Stem) ×1 IMPLANT
SUT ETHIBOND NAB CT1 #1 30IN (SUTURE) ×3 IMPLANT
SUT MNCRL AB 4-0 PS2 18 (SUTURE) IMPLANT
SUT VIC AB 0 CT1 27 (SUTURE) ×2
SUT VIC AB 0 CT1 27XBRD ANBCTR (SUTURE) ×2 IMPLANT
SUT VIC AB 1 CT1 27 (SUTURE) ×2
SUT VIC AB 1 CT1 27XBRD ANBCTR (SUTURE) ×2 IMPLANT
SUT VIC AB 2-0 CT1 27 (SUTURE) ×2
SUT VIC AB 2-0 CT1 TAPERPNT 27 (SUTURE) ×2 IMPLANT
TOWEL GREEN STERILE (TOWEL DISPOSABLE) ×3 IMPLANT
TOWEL GREEN STERILE FF (TOWEL DISPOSABLE) ×3 IMPLANT
TRAY FOLEY W/BAG SLVR 16FR (SET/KITS/TRAYS/PACK)
TRAY FOLEY W/BAG SLVR 16FR ST (SET/KITS/TRAYS/PACK) IMPLANT
WATER STERILE IRR 1000ML POUR (IV SOLUTION) ×5 IMPLANT

## 2021-10-24 NOTE — Transfer of Care (Signed)
Immediate Anesthesia Transfer of Care Note  Patient: DELA SWEENY  Procedure(s) Performed: RIGHT ANTERIOR HIP REVISION (Right: Hip)  Patient Location: PACU  Anesthesia Type:General  Level of Consciousness: awake  Airway & Oxygen Therapy: Patient Spontanous Breathing  Post-op Assessment: Report given to RN and Post -op Vital signs reviewed and stable  Post vital signs: Reviewed and stable  Last Vitals:  Vitals Value Taken Time  BP 124/70 10/24/21 1810  Temp 36.7 C 10/24/21 1810  Pulse 77 10/24/21 1812  Resp 21 10/24/21 1812  SpO2 94 % 10/24/21 1812  Vitals shown include unvalidated device data.  Last Pain:  Vitals:   10/24/21 1235  TempSrc:   PainSc: 10-Worst pain ever      Patients Stated Pain Goal: 3 (47/20/72 1828)  Complications: No notable events documented.

## 2021-10-24 NOTE — Brief Op Note (Signed)
10/24/2021  5:50 PM  PATIENT:  Jeanette Compton  66 y.o. female  PRE-OPERATIVE DIAGNOSIS:  LOOSENING RIGHT HIP FEMORAL COMPONENT  POST-OPERATIVE DIAGNOSIS:  LOOSENING RIGHT HIP FEMORAL COMPONENT  PROCEDURE:  Procedure(s): RIGHT ANTERIOR HIP REVISION (Right)  SURGEON:  Surgeon(s) and Role:    * Mcarthur Rossetti, MD - Primary  PHYSICIAN ASSISTANT:  Benita Stabile, PA-C  ANESTHESIA:   general  EBL:  400 mL   COUNTS:  YES  DICTATION: .Other Dictation: Dictation Number [id number]  PLAN OF CARE: Admit to inpatient   PATIENT DISPOSITION:  PACU - hemodynamically stable.   Delay start of Pharmacological VTE agent (>24hrs) due to surgical blood loss or risk of bleeding: no

## 2021-10-24 NOTE — Op Note (Unsigned)
NAME: Jeanette Compton, Jeanette Compton MEDICAL RECORD NO: [id number] ACCOUNT NO: [id number] DATE OF BIRTH: [birth year] FACILITY: [facility] LOCATION: [facility] PHYSICIAN: Lind Guest. Ninfa Linden, MD  Operative Report   DATE OF PROCEDURE: 10/24/2021  PREOPERATIVE DIAGNOSIS:  Aseptic loosening of right total hip arthroplasty, femoral component.  POSTOPERATIVE DIAGNOSIS:  Aseptic loosening of right total hip arthroplasty, femoral component.  PROCEDURES:   1.  Revision of right hip femoral component. 2.  Exchange of right hip polyethylene liner and hip ball.  IMPLANTS:  DePuy size 7 ACTIS femoral component with high offset, size 32+4 neutral polyethylene liner for a size 50 acetabular component, size 32+1 ceramic hip ball.  SURGEON:  Lind Guest. Ninfa Linden, MD  ASSISTANT:  Erskine Emery, PA-C  ANESTHESIA:  General.  ANTIBIOTICS:  2 g IV Ancef.  ESTIMATED BLOOD LOSS:  301 mL  COMPLICATIONS:  None.  INDICATIONS:  The patient is a 66 year old female who underwent a primary total hip arthroplasty of the right hip in 2019 for osteoarthritis.  She developed right hip pain eventually and was found to have aseptic loosening of the femoral component.  At  this point, her right hip pain is daily with weightbearing and she does wish for revision of this and we have recommended this as well.  There does not appear to have any evidence of infection on previous labs and analysis.  This does appear  radiographically to be loosening of the femoral component and the cement seen on plain films, bone scan and MRI.  We had a long and thorough discussion about the risk of acute blood loss anemia, nerve or vessel injury, fracture, infection, implant  failure, dislocation, DVT, and leg length differences.  We talked about our goals being hopefully decrease pain, improve mobility and overall improve quality of life.  DESCRIPTION OF PROCEDURE:  After informed consent was obtained, appropriate right hip was marked.  She was brought to  the operating room where general anesthesia was obtained while she was on a stretcher.  Traction boots were placed on both her feet and  she was then placed supine on the Hana fracture table with the perineal post in place and both legs in line skeletal traction device and no traction applied.  Her right operative hip was assessed radiographically and then prepped and draped with DuraPrep  and sterile drapes.  A timeout was called.  She was identified as correct patient, correct right hip.  We then made an incision, which was a direct anterior approach through her previous incision, dissected down tensor fascia lata muscle. The tensor  fascia was divided longitudinally to proceed with direct anterior approach to the hip.  Obviously, there was abundant scar tissue and we had to our way through scar tissue to find the plane that we needed to be and eventually after some time, we were  able to find our plane through previous scar tissue.  We were able to identify the hip joint and removed abundant scar tissue around the capsule.  There was no evidence of infection and no fluid collection at all.  Once we were able to remove abundant  scar tissue, we were able to dislocate the hip.  We removed the femoral head and then after just a little bit of work on the femoral component, the femoral component was removed entirely without any damage to the bone and there was no HA coating on the  stem at all and no bone ingrowth.  We then used curettes to remove any type of biofilm from around  the femur.  We irrigated the femur out extensively.  We then changed out old polyethylene liner since we were in the hip.  There was no previous wear on  that polyethylene liner, but we still went up to a higher offset liner given the fact that we were releasing a lot of scar tissue and we did not want to destabilize the offset.  We placed a 32+4 neutral polyethylene liner for a size 50 acetabular  component instead of the +0 liner  that she had before.  We then went back to the femur, started broaching using the ACTIS broaching system from a size 0 going up to a size 7.  With a size 7 in place, we trialed a standard offset femoral neck and a 32+1  hip ball and brought the leg back over and up reducing the pelvis.  We assessed it radiographically and clinically and I felt like the only difference we needed was at least more offset.  We dislocated the hip, removed the trial components.  We placed  the real ACTIS femoral component size 7 with high offset and we went with a real 32+1 ceramic hip ball and again reduced this in acetabulum.  We were pleased with stability.  We assessed radiographically and clinically.  I have increased her offset and  leg length, but I think she needs this for stability purposes as well.  The femoral component had a nice tight fit within the femoral canal assessed clinically and radiographically.  We then irrigated the soft tissue with 3 liters of normal saline  solution using pulsatile lavage.  We closed any remnants of the joint capsule we could with interrupted #1 Ethibond suture followed by #1 Vicryl to close the tensor fascia.  0 Vicryl was used to close the deep tissue and 2-0 Vicryl was used to close  subcutaneous tissue.  The skin was reapproximated with staples.  An Aquacel dressing was applied.  She was awakened, extubated, and taken to recovery room in stable condition with all final counts being correct.  There were no complications noted.  Of  note, Erskine Emery, PA-C assisted during the entire case.  His assistance was crucial for facilitating every aspect of this case.  Of note, he was helpful with soft tissue retraction and helping guide implant placement as well as layered closure of the  wound.   VAI D: 10/24/2021 5:48:16 pm T: 10/24/2021 10:15:00 pm  JOB: [id number]/ [id number]

## 2021-10-24 NOTE — Anesthesia Procedure Notes (Signed)
Procedure Name: Intubation Date/Time: 10/24/2021 4:03 PM Performed by: Minerva Ends, CRNA Pre-anesthesia Checklist: Patient identified, Emergency Drugs available, Suction available and Patient being monitored Patient Re-evaluated:Patient Re-evaluated prior to induction Oxygen Delivery Method: Circle system utilized Preoxygenation: Pre-oxygenation with 100% oxygen Induction Type: IV induction Ventilation: Mask ventilation without difficulty Laryngoscope Size: Mac and 3 Grade View: Grade I Tube type: Oral Tube size: 7.0 mm Number of attempts: 1 Airway Equipment and Method: Stylet and Oral airway Placement Confirmation: ETT inserted through vocal cords under direct vision, positive ETCO2 and breath sounds checked- equal and bilateral Secured at: 22 cm Tube secured with: Tape Dental Injury: Teeth and Oropharynx as per pre-operative assessment

## 2021-10-24 NOTE — H&P (Signed)
TOTAL HIP REVISION ADMISSION H&P  Patient is admitted for right revision total hip arthroplasty.  Subjective:  Chief Complaint: right hip pain  HPI: Jeanette Compton, 66 y.o. female, has a history of pain and functional disability in the right hip due to  aseptic loosening of right hip femoral component from a total hip replacement  and patient has failed non-surgical conservative treatments for greater than 12 weeks to include NSAID's and/or analgesics, use of assistive devices, and activity modification. The indications for the revision total hip arthroplasty are loosening of one or more components.  Onset of symptoms was abrupt starting 1 years ago with gradually worsening course since that time.  Prior procedures on the right hip include arthroplasty.  Patient currently rates pain in the right hip at 10 out of 10 with activity.  There is worsening of pain with activity and weight bearing, pain that interfers with activities of daily living, and pain with passive range of motion. Patient has evidence of prosthetic loosening by imaging studies.  This condition presents safety issues increasing the risk of falls.  There is no current active infection.  Patient Active Problem List   Diagnosis Date Noted   Loosening of femoral component of prosthetic right hip ([place] 10/24/2021   Status post arthroscopy of right knee 06/15/2021   Diabetes mellitus type 2 in obese ([place]) 05/12/2021   Encounter for general adult medical examination with abnormal findings 06/09/2019   Hypothyroidism, adult 02/10/2019   Obesity (BMI 30.0-34.9) 02/10/2019   Vitamin D deficiency 02/10/2019   Rectal bleeding 07/29/2018   Status post total replacement of right hip 12/13/2017   Unilateral primary osteoarthritis, right hip 09/11/2017   Papilloma of right breast    Neuralgia, post-herpetic 11/13/2016   History of colonic polyps 10/05/2016   HLD (hyperlipidemia) 09/10/2016   Essential hypertension 09/10/2016   Primary  vitiligo 09/10/2016   Osteoarthritis of knee 09/10/2016   Chronic lower back pain 09/10/2016   Past Medical History:  Diagnosis Date   Arthritis    knee , ankle and back   Bronchitis    Cataract    Diabetes mellitus without complication ([place]) 5/73/2202   Hyperlipidemia    Hypertension    Hypothyroidism, adult 02/10/2019   Neuralgia, post-herpetic 11/13/2016   Obesity (BMI 30.0-34.9) 02/10/2019   Thyroid disease    Vitamin D deficiency disease 02/10/2019    Past Surgical History:  Procedure Laterality Date   ABDOMINAL HYSTERECTOMY     fibroid   BREAST BIOPSY Right 07/31/2017   Procedure: EXCISIONAL BREAST BIOPSY WITH NEEDLE LOCALIZATION;  Surgeon: Virl Cagey, MD;  Location: [facility];  Service: General;  Laterality: Right;   BREAST SURGERY Right    COLONOSCOPY  09/13/2011   Procedure: COLONOSCOPY;  Surgeon: Rogene Houston, MD;  Location: [facility];  Service: Endoscopy;  Laterality: N/A;  1030   COLONOSCOPY N/A 07/31/2018   Procedure: COLONOSCOPY;  Surgeon: Rogene Houston, MD;  Location: [facility];  Service: Endoscopy;  Laterality: N/A;   POLYPECTOMY  07/31/2018   Procedure: POLYPECTOMY;  Surgeon: Rogene Houston, MD;  Location: [facility];  Service: Endoscopy;;   TOTAL HIP ARTHROPLASTY Right 12/13/2017   Procedure: RIGHT TOTAL HIP ARTHROPLASTY ANTERIOR APPROACH;  Surgeon: Mcarthur Rossetti, MD;  Location: [facility];  Service: Orthopedics;  Laterality: Right;    No current facility-administered medications for this encounter.   Current Outpatient Medications  Medication Sig Dispense Refill Last Dose   Cholecalciferol (VITAMIN D3) 125 MCG (5000  UT) CAPS Take 2 capsules by mouth daily.       HYDROcodone-acetaminophen (NORCO) 7.5-325 MG tablet Take 1-2 tablets by mouth every 6 (six) hours as needed for moderate pain. (Patient taking differently: Take 1-2 tablets by mouth every 6 (six) hours as needed.) 30 tablet 0    levothyroxine (SYNTHROID) 75 MCG tablet TAKE 1  TABLET BY MOUTH DAILY 90 tablet 3    losartan-hydrochlorothiazide (HYZAAR) 100-12.5 MG tablet TAKE 1 TABLET BY MOUTH DAILY 90 tablet 3    methocarbamol (ROBAXIN) 500 MG tablet Take 1 tablet (500 mg total) by mouth 3 (three) times daily. (Patient taking differently: Take 500 mg by mouth 3 (three) times daily as needed for muscle spasms.) 40 tablet 1    Multiple Vitamins-Minerals (HAIR SKIN AND NAILS FORMULA) TABS Take 2 tablets by mouth daily.      rosuvastatin (CRESTOR) 5 MG tablet Take 1 tablet (5 mg total) by mouth daily. 90 tablet 1    RYBELSUS 7 MG TABS TAKE 1 TABLET BY MOUTH DAILY 90 tablet 3    baclofen (LIORESAL) 10 MG tablet Take 1 tablet (10 mg total) by mouth 3 (three) times daily as needed for muscle spasms. (Patient not taking: Reported on 10/10/2021) 30 each 0 Not Taking   Allergies  Allergen Reactions   Lisinopril Swelling and Other (See Comments)    Lips swelled    Social History   Tobacco Use   Smoking status: Former    Packs/day: 0.50    Years: 3.00    Pack years: 1.50    Types: Cigarettes    Quit date: 1981    Years since quitting: 42.4   Smokeless tobacco: Never  Substance Use Topics   Alcohol use: No    Family History  Problem Relation Age of Onset   Stroke Mother    Diabetes Mother    Alcohol abuse Father    Early death Father    Early death Sister        pneumonia   Cancer Brother    Alzheimer's disease Sister    COPD Brother    Colon cancer Neg Hx       Review of Systems  Musculoskeletal:  Positive for gait problem.  All other systems reviewed and are negative.  Objective:  Physical Exam Vitals reviewed.  Constitutional:      Appearance: Normal appearance.  HENT:     Head: Normocephalic and atraumatic.  Eyes:     Extraocular Movements: Extraocular movements intact.     Pupils: Pupils are equal, round, and reactive to light.  Cardiovascular:     Rate and Rhythm: Normal rate and regular rhythm.     Pulses: Normal pulses.  Pulmonary:      Effort: Pulmonary effort is normal.     Breath sounds: Normal breath sounds.  Abdominal:     Palpations: Abdomen is soft.  Musculoskeletal:     Cervical back: Normal range of motion and neck supple.     Right hip: Tenderness and bony tenderness present. Decreased strength.  Neurological:     Mental Status: She is alert and oriented to person, place, and time.  Psychiatric:        Behavior: Behavior normal.    Vital signs in last 24 hours:     Labs:   Estimated body mass index is 31.01 kg/m as calculated from the following:   Height as of 10/18/21: '5\' 6"'$  (1.676 m).   Weight as of 10/18/21: 87.1 kg.  Imaging Review:  Plain radiographs demonstrate loosening of the femoral component of a right total hip.   Assessment/Plan:  Right hip pain with failed previous arthroplasty due to aseptic loosening of the femoral component  The patient history, physical examination, clinical judgement of the provider and imaging studies are consistent with loosening of the right hip(s), previous total hip arthroplasty. Revision total hip arthroplasty is deemed medically necessary. The treatment options including medical management, injection therapy, arthroscopy and arthroplasty were discussed at length. The risks and benefits of total hip arthroplasty were presented and reviewed. The risks due to aseptic loosening, infection, stiffness, dislocation/subluxation,  thromboembolic complications and other imponderables were discussed.  The patient acknowledged the explanation, agreed to proceed with the plan and consent was signed. Patient is being admitted for inpatient treatment for surgery, pain control, PT, OT, prophylactic antibiotics, VTE prophylaxis, progressive ambulation and ADL's and discharge planning. The patient is planning to be discharged home with home health services

## 2021-10-24 NOTE — Interval H&P Note (Signed)
History and Physical Interval Note: The patient understands that she is revision surgery as it relates to her right total hip arthroplasty.  There is suspected aseptic loosening of the femoral component.  She understands why we are recommending surgery and the risk and benefits of involved.  There has been no acute or interval change in her medical status.  Informed consent has been obtained and the right operative hip has been marked.  10/24/2021 2:54 PM  Jeanette Compton  has presented today for surgery, with the diagnosis of LOOSENING RIGHT HIP FEMORAL COMPONENT.  The various methods of treatment have been discussed with the patient and family. After consideration of risks, benefits and other options for treatment, the patient has consented to  Procedure(s): RIGHT ANTERIOR HIP REVISION (Right) as a surgical intervention.  The patient's history has been reviewed, patient examined, no change in status, stable for surgery.  I have reviewed the patient's chart and labs.  Questions were answered to the patient's satisfaction.     Mcarthur Rossetti

## 2021-10-24 NOTE — Interval H&P Note (Signed)
History and Physical Interval Note: The patient understands that she is here today for surgery involving her right hip.  There is suspected aseptic loosening of the femoral component.  She does have pain with weightbearing and no evidence of infection.  We have talked in length about revision arthroplasty with revising the femoral component and assessing other parts of the component and revising as necessary.  The risk and benefits of surgery been explained in detail and informed consent is obtained.  The left operative hip has been marked.  10/24/2021 2:53 PM  Jeanette Compton  has presented today for surgery, with the diagnosis of LOOSENING RIGHT HIP FEMORAL COMPONENT.  The various methods of treatment have been discussed with the patient and family. After consideration of risks, benefits and other options for treatment, the patient has consented to  Procedure(s): RIGHT ANTERIOR HIP REVISION (Right) as a surgical intervention.  The patient's history has been reviewed, patient examined, no change in status, stable for surgery.  I have reviewed the patient's chart and labs.  Questions were answered to the patient's satisfaction.     Mcarthur Rossetti

## 2021-10-25 ENCOUNTER — Encounter (HOSPITAL_COMMUNITY): Payer: Self-pay | Admitting: Orthopaedic Surgery

## 2021-10-25 LAB — GLUCOSE, CAPILLARY
Glucose-Capillary: 155 mg/dL — ABNORMAL HIGH (ref 70–99)
Glucose-Capillary: 112 mg/dL — ABNORMAL HIGH (ref 70–99)
Glucose-Capillary: 95 mg/dL (ref 70–99)

## 2021-10-25 LAB — CBC
HCT: 33.4 % — ABNORMAL LOW (ref 36.0–46.0)
Hemoglobin: 10.5 g/dL — ABNORMAL LOW (ref 12.0–15.0)
MCH: 27.1 pg (ref 26.0–34.0)
MCHC: 31.4 g/dL (ref 30.0–36.0)
MCV: 86.3 fL (ref 80.0–100.0)
Platelets: 299 10*3/uL (ref 150–400)
RBC: 3.87 MIL/uL (ref 3.87–5.11)
RDW: 13 % (ref 11.5–15.5)
WBC: 14.4 10*3/uL — ABNORMAL HIGH (ref 4.0–10.5)
nRBC: 0 % (ref 0.0–0.2)

## 2021-10-25 LAB — BASIC METABOLIC PANEL
Anion gap: 11 (ref 5–15)
BUN: 8 mg/dL (ref 8–23)
CO2: 25 mmol/L (ref 22–32)
Calcium: 8.5 mg/dL — ABNORMAL LOW (ref 8.9–10.3)
Chloride: 102 mmol/L (ref 98–111)
Creatinine, Ser: 0.76 mg/dL (ref 0.44–1.00)
GFR, Estimated: >60 mL/min (ref >60)
Glucose, Bld: 158 mg/dL — ABNORMAL HIGH (ref 70–99)
Potassium: 4.3 mmol/L (ref 3.5–5.1)
Sodium: 138 mmol/L (ref 135–145)

## 2021-10-25 NOTE — Progress Notes (Signed)
Subjective: 1 Day Post-Op Procedure(s) (LRB): RIGHT ANTERIOR HIP REVISION (Right) Patient reports pain as moderate.    Objective: Vital signs in last 24 hours: Temp:  [98 F (36.7 C)-98.9 F (37.2 C)] 98.9 F (37.2 C) (06/07 0747) Pulse Rate:  [63-89] 80 (06/07 0747) Resp:  [12-19] 16 (06/07 0747) BP: (117-173)/(59-76) 117/59 (06/07 0747) SpO2:  [98 %-100 %] 100 % (06/07 0747) Weight:  [87.1 kg] 87.1 kg (06/06 1217)  Intake/Output from previous day: 06/06 0701 - 06/07 0700 In: 1000 [I.V.:1000] Out: 400 [Blood:400] Intake/Output this shift: No intake/output data recorded.  Recent Labs    10/25/21 0315  HGB 10.5*   Recent Labs    10/25/21 0315  WBC 14.4*  RBC 3.87  HCT 33.4*  PLT 299   Recent Labs    10/25/21 0315  NA 138  K 4.3  CL 102  CO2 25  BUN 8  CREATININE 0.76  GLUCOSE 158*  CALCIUM 8.5*   No results for input(s): LABPT, INR in the last 72 hours.  Sensation intact distally Intact pulses distally Dorsiflexion/Plantar flexion intact Incision: scant drainage   Assessment/Plan: 1 Day Post-Op Procedure(s) (LRB): RIGHT ANTERIOR HIP REVISION (Right) Up with therapy      Mcarthur Rossetti 10/25/2021, 7:49 AM

## 2021-10-25 NOTE — Progress Notes (Signed)
10/25/21 1400  PT Visit Information  Last PT Received On 10/25/21  Assistance Needed +1  History of Present Illness Jeanette Compton, 66 y.o. female, admitted 6/6 for a right anterior total hip revision. PMH:  DM, knee arthoscopy 1/23, prior right THA 2019  Subjective Data  Patient Stated Goal to go homee  Precautions  Precautions Anterior Hip  Precaution Booklet Issued Yes (comment)  Restrictions  Weight Bearing Restrictions Yes  RLE Weight Bearing WBAT  Pain Assessment  Pain Assessment Faces  Faces Pain Scale 8  Pain Location right hip  Pain Descriptors / Indicators Crying;Aching;Burning  Pain Intervention(s) Limited activity within patient's tolerance;Monitored during session;Repositioned  Cognition  Arousal/Alertness Awake/alert  Behavior During Therapy WFL for tasks assessed/performed  Overall Cognitive Status Within Functional Limits for tasks assessed  Bed Mobility  Overal bed mobility Needs Assistance  Bed Mobility Sit to Supine  Sit to supine Min assist  General bed mobility comments assist needed for right LE onto bed  Transfers  Overall transfer level Needs assistance  Equipment used Rolling walker (2 wheels)  Transfers Sit to/from Stand  Sit to Stand Min assist  General transfer comment A little assist for power up and steadying as well as cues for hand placement  Ambulation/Gait  Ambulation/Gait assistance Min assist  Gait Distance (Feet) 25 Feet  Assistive device Rolling walker (2 wheels)  Gait Pattern/deviations Decreased step length - right;Decreased step length - left;Step-to pattern;Decreased stance time - right;Decreased weight shift to right;Antalgic  General Gait Details Pt needed cues for sequencing steps and RW.  Cues to stay close to RW.  Pain limtiing incr distance  Gait velocity interpretation <1.31 ft/sec, indicative of household ambulator  Balance  Overall balance assessment Needs assistance  Sitting-balance support No upper extremity  supported;Feet supported  Sitting balance-Leahy Scale Fair  Standing balance support Bilateral upper extremity supported;During functional activity  Standing balance-Leahy Scale Poor  Standing balance comment relies on UE support fot balance and to decr pain  Exercises  Exercises General Lower Extremity  General Exercises - Lower Extremity  Ankle Circles/Pumps AROM;Both;10 reps;Supine  Quad Sets AROM;Both;10 reps;Supine  Heel Slides AAROM;Both;5 reps;Supine  PT - End of Session  Equipment Utilized During Treatment Gait belt  Activity Tolerance Patient limited by fatigue  Patient left with call bell/phone within reach;in bed;with bed alarm set  Nurse Communication Mobility status   PT - Assessment/Plan  PT Plan Current plan remains appropriate  PT Visit Diagnosis Unsteadiness on feet (R26.81);Muscle weakness (generalized) (M62.81);Pain  Pain - Right/Left Right  Pain - part of body Leg  PT Frequency (ACUTE ONLY) 7X/week  Follow Up Recommendations Home health PT  Assistance recommended at discharge Set up Supervision/Assistance  Patient can return home with the following A little help with walking and/or transfers;A little help with bathing/dressing/bathroom;Assistance with cooking/housework;Assist for transportation;Help with stairs or ramp for entrance  PT equipment STM/1DQ2  AM-PAC PT "6 Clicks" Mobility Outcome Measure (Version 2)  Help needed turning from your back to your side while in a flat bed without using bedrails? 3  Help needed moving from lying on your back to sitting on the side of a flat bed without using bedrails? 3  Help needed moving to and from a bed to a chair (including a wheelchair)? 3  Help needed standing up from a chair using your arms (e.g., wheelchair or bedside chair)? 3  Help needed to walk in hospital room? 3  Help needed climbing 3-5 steps with a railing?  3  6 Click Score 18  Consider Recommendation of Discharge To: Home with [facility]  Progressive Mobility   What is the highest level of mobility based on the progressive mobility assessment? Level 4 (Walks with assist in room) - Balance while marching in place and cannot step forward and back - Complete  Activity Ambulated with assistance in room  PT Goal Progression  Progress towards PT goals Progressing toward goals  PT Time Calculation  PT Start Time (ACUTE ONLY) 1153  PT Stop Time (ACUTE ONLY) 1210  PT Time Calculation (min) (ACUTE ONLY) 17 min  PT General Charges  $$ ACUTE PT VISIT 1 Visit  PT Treatments  $Gait Training 8-22 mins  Pt progressing with ambulation and mobility. Continues to have pain and need cues for sequencing steps and RW.  Will continue acute PT.  Yameli Delamater M,PT Acute Rehab Services [phone]

## 2021-10-25 NOTE — TOC Initial Note (Signed)
Transition of Care [facility]) - Initial/Assessment Note    Patient Details  Name: Jeanette Compton MRN: [id number] Date of Birth: 1956/04/14  [facility] CM/SW Contact:    Carles Collet, RN Phone Number: 10/25/2021, 11:10 AM  Clinical Narrative:               Spoke w patient at bedside.  Discussed needs for DC. She states that she plans to g home, and is interested in [facility] services. Discussed that previous [facility] provider, [facility], was unable to accept this time and she is agreeable to any [facility] provider. Accepted by [facility] for PT. She states that she has RW at home and is interested in 3/1 to use in the shower. Referral made to [employer] to deliver to the room today. Patient states that she will have help daily at home.  No other TOC needs anticipated at this time.     Expected Discharge Plan: [place] Barriers to Discharge: Continued Medical Work up   Patient Goals and CMS Choice Patient states their goals for this hospitalization and ongoing recovery are:: to go home. CMS Medicare.gov Compare Post Acute Care list provided to:: Patient Choice offered to / list presented to : Patient  Expected Discharge Plan and Services Expected Discharge Plan: [place]   Discharge Planning Services: CM Consult Post Acute Care Choice: Home Health, Durable Medical Equipment                   DME Arranged: 3-N-1 DME Agency: [employer] Date DME Agency Contacted: 10/25/21 Time DME Agency Contacted: 52 Representative spoke with at DME Agency: [facility]on [facility] HH Arranged: PT [place]: [place]        Prior Living Arrangements/Services                       Activities of Daily Living      Permission Sought/Granted                  Emotional Assessment              Admission diagnosis:  Status post revision of total hip [Z96.649] Patient Active Problem List   Diagnosis Date Noted   Loosening of femoral  component of prosthetic right hip ([place]) 10/24/2021   Status post revision of total hip 10/24/2021   Status post arthroscopy of right knee 06/15/2021   Diabetes mellitus type 2 in obese ([place]) 05/12/2021   Encounter for general adult medical examination with abnormal findings 06/09/2019   Hypothyroidism, adult 02/10/2019   Obesity (BMI 30.0-34.9) 02/10/2019   Vitamin D deficiency 02/10/2019   Rectal bleeding 07/29/2018   Status post total replacement of right hip 12/13/2017   Unilateral primary osteoarthritis, right hip 09/11/2017   Papilloma of right breast    Neuralgia, post-herpetic 11/13/2016   History of colonic polyps 10/05/2016   HLD (hyperlipidemia) 09/10/2016   Essential hypertension 09/10/2016   Primary vitiligo 09/10/2016   Osteoarthritis of knee 09/10/2016   Chronic lower back pain 09/10/2016   PCP:  Lindell Spar, MD Pharmacy:   [place], [address] [place] [address] Phone: [phone] Fax: [phone]  [place], [place] - [id number] [place] [address] Phone: [phone] Fax: [facility] #[id number] - [place], [address] [place]  [address] [place] [address] Phone: [phone] Fax: [phone]  [facility] ([facility] ) - [place], [address] [place] [address] Phone: [phone] Fax: [phone]     Social Determinants of Health (SDOH) Interventions    Readmission Risk Interventions     View : No data to display.

## 2021-10-25 NOTE — Evaluation (Signed)
Physical Therapy Evaluation Patient Details Name: JAMYLAH MARINACCIO MRN: [id number] DOB: 01/26/1956 Today's Date: 10/25/2021  History of Present Illness  [name], 66 y.o. female, admitted 6/6 for a right anterior total hip revision. PMH:  DM, knee arthoscopy 1/23, prior right THA 2019  Clinical Impression  Pt admitted with above diagnosis. Pt was able to ambulate with RW with limitation by pain needing min assist for stability with cues as well.  Should progress well and has support at home.   Pt currently with functional limitations due to the deficits listed below (see PT Problem List). Pt will benefit from skilled PT to increase their independence and safety with mobility to allow discharge to the venue listed below.          Recommendations for follow up therapy are one component of a multi-disciplinary discharge planning process, led by the attending physician.  Recommendations may be updated based on patient status, additional functional criteria and insurance authorization.  Follow Up Recommendations Home health PT    Assistance Recommended at Discharge Set up Supervision/Assistance  Patient can return home with the following  A little help with walking and/or transfers;A little help with bathing/dressing/bathroom;Assistance with cooking/housework;Assist for transportation;Help with stairs or ramp for entrance    Equipment Recommendations BSC/3in1  Recommendations for Other Services       Functional Status Assessment Patient has had a recent decline in their functional status and demonstrates the ability to make significant improvements in function in a reasonable and predictable amount of time.     Precautions / Restrictions Precautions Precautions: Anterior Hip Precaution Booklet Issued: Yes (comment) Restrictions Weight Bearing Restrictions: Yes RLE Weight Bearing: Weight bearing as tolerated      Mobility  Bed Mobility Overal bed mobility: Needs Assistance Bed  Mobility: Supine to Sit     Supine to sit: Min assist     General bed mobility comments: assist needed for right LE to EOB    Transfers Overall transfer level: Needs assistance Equipment used: Rolling walker (2 wheels) Transfers: Sit to/from Stand Sit to Stand: Min assist           General transfer comment: A little assist for power up and steadying as well as cues for hand placement    Ambulation/Gait Ambulation/Gait assistance: Min assist Gait Distance (Feet): 25 Feet Assistive device: Rolling walker (2 wheels) Gait Pattern/deviations: Decreased step length - right, Decreased step length - left, Step-to pattern, Decreased stance time - right, Decreased weight shift to right, Antalgic   Gait velocity interpretation: <1.31 ft/sec, indicative of household ambulator   General Gait Details: Pt needed cues for sequencing steps and RW.  Cues to stay close to RW.  Pain limtiing incr distance  Stairs            Wheelchair Mobility    Modified Rankin (Stroke Patients Only)       Balance Overall balance assessment: Needs assistance Sitting-balance support: No upper extremity supported, Feet supported Sitting balance-Leahy Scale: Fair     Standing balance support: Bilateral upper extremity supported, During functional activity Standing balance-Leahy Scale: Poor Standing balance comment: relies on UE support fot balance and to decr pain                             Pertinent Vitals/Pain Pain Assessment Pain Assessment: Faces Faces Pain Scale: Hurts whole lot Pain Location: right hip Pain Descriptors / Indicators: Crying, Aching, Burning Pain Intervention(s): Limited activity  within patient's tolerance, Monitored during session, Premedicated before session, Repositioned    Home Living Family/patient expects to be discharged to:: Private residence Living Arrangements: Other relatives Available Help at Discharge: Family;Personal care  attendant;Available 24 hours/day (nurse 2 hours day 7 days week and granddaughter wiht pt) Type of Home: House Home Access: Stairs to enter Entrance Stairs-Rails: Right Entrance Stairs-Number of Steps: 5   Home Layout: One level Home Equipment: Standard Walker;Cane - single point      Prior Function               Mobility Comments: Modif I with walker ADLs Comments: B/D assist     Hand Dominance        Extremity/Trunk Assessment   Upper Extremity Assessment Upper Extremity Assessment: Defer to OT evaluation    Lower Extremity Assessment Lower Extremity Assessment: RLE deficits/detail RLE: Unable to fully assess due to pain    Cervical / Trunk Assessment Cervical / Trunk Assessment: Normal  Communication   Communication: No difficulties  Cognition Arousal/Alertness: Awake/alert Behavior During Therapy: WFL for tasks assessed/performed Overall Cognitive Status: Within Functional Limits for tasks assessed                                          General Comments      Exercises General Exercises - Lower Extremity Ankle Circles/Pumps: AROM, Both, 10 reps, Supine Quad Sets: AROM, Both, 10 reps, Supine Long Arc Quad: AROM, Both, 10 reps, Seated Heel Slides: AAROM, Both, 5 reps, Supine Hip Flexion/Marching: AAROM, Right, 5 reps, Standing   Assessment/Plan    PT Assessment Patient needs continued PT services  PT Problem List Decreased strength;Decreased range of motion;Decreased activity tolerance;Decreased balance;Decreased mobility;Decreased knowledge of use of DME;Decreased safety awareness;Decreased knowledge of precautions;Pain       PT Treatment Interventions DME instruction;Gait training;Functional mobility training;Stair training;Therapeutic activities;Therapeutic exercise;Balance training;Patient/family education    PT Goals (Current goals can be found in the Care Plan section)  Acute Rehab PT Goals Patient Stated Goal: to go  homee PT Goal Formulation: With patient Time For Goal Achievement: 11/08/21 Potential to Achieve Goals: Good    Frequency 7X/week     Co-evaluation               AM-PAC PT "6 Clicks" Mobility  Outcome Measure Help needed turning from your back to your side while in a flat bed without using bedrails?: A Little Help needed moving from lying on your back to sitting on the side of a flat bed without using bedrails?: A Little Help needed moving to and from a bed to a chair (including a wheelchair)?: A Little Help needed standing up from a chair using your arms (e.g., wheelchair or bedside chair)?: A Little Help needed to walk in hospital room?: A Little Help needed climbing 3-5 steps with a railing? : A Little 6 Click Score: 18    End of Session Equipment Utilized During Treatment: Gait belt Activity Tolerance: Patient limited by fatigue Patient left: in chair;with call bell/phone within reach;with chair alarm set Nurse Communication: Mobility status PT Visit Diagnosis: Unsteadiness on feet (R26.81);Muscle weakness (generalized) (M62.81);Pain Pain - Right/Left: Right Pain - part of body: Leg    Time: 1100-1125 PT Time Calculation (min) (ACUTE ONLY): 25 min   Charges:   PT Evaluation $PT Eval Moderate Complexity: 1 Mod PT Treatments $Gait Training: 8-22 mins  [facility] M,PT Acute Rehab Services [place] 10/25/2021, 2:27 PM

## 2021-10-25 NOTE — Anesthesia Postprocedure Evaluation (Signed)
Anesthesia Post Note  Patient: Jeanette Compton  Procedure(s) Performed: RIGHT ANTERIOR HIP REVISION (Right: Hip)     Patient location during evaluation: PACU Anesthesia Type: General Level of consciousness: sedated and patient cooperative Pain management: pain level controlled Vital Signs Assessment: post-procedure vital signs reviewed and stable Respiratory status: spontaneous breathing Cardiovascular status: stable Anesthetic complications: no   No notable events documented.  Last Vitals:  Vitals:   10/25/21 0747 10/25/21 1554  BP: (!) 117/59 117/62  Pulse: 80 79  Resp: 16 16  Temp: 37.2 C 37.5 C  SpO2: 100% 99%    Last Pain:  Vitals:   10/25/21 1554  TempSrc: Oral  PainSc:                  Nolon Nations

## 2021-10-25 NOTE — Discharge Instructions (Signed)

## 2021-10-26 NOTE — Plan of Care (Signed)
Patient ID: Jeanette Compton, female   DOB: 1955/10/15, 65 y.o.   MRN: [id number]  Problem: Education: Goal: Knowledge of General Education information will improve Description: Including pain rating scale, medication(s)/side effects and non-pharmacologic comfort measures Outcome: Progressing   Problem: Health Behavior/Discharge Planning: Goal: Ability to manage health-related needs will improve Outcome: Progressing   Problem: Clinical Measurements: Goal: Ability to maintain clinical measurements within normal limits will improve Outcome: Progressing Goal: Will remain free from infection Outcome: Progressing Goal: Diagnostic test results will improve Outcome: Progressing Goal: Respiratory complications will improve Outcome: Progressing Goal: Cardiovascular complication will be avoided Outcome: Progressing   Problem: Activity: Goal: Risk for activity intolerance will decrease Outcome: Progressing   Problem: Nutrition: Goal: Adequate nutrition will be maintained Outcome: Progressing   Problem: Coping: Goal: Level of anxiety will decrease Outcome: Progressing   Problem: Elimination: Goal: Will not experience complications related to bowel motility Outcome: Progressing Goal: Will not experience complications related to urinary retention Outcome: Progressing   Problem: Pain Managment: Goal: General experience of comfort will improve Outcome: Progressing   Problem: Safety: Goal: Ability to remain free from injury will improve Outcome: Progressing   Problem: Skin Integrity: Goal: Risk for impaired skin integrity will decrease Outcome: Progressing    Haydee Salter, RN

## 2021-10-26 NOTE — Progress Notes (Signed)
Physical Therapy Treatment Patient Details Name: Jeanette Compton MRN: [id number] DOB: 1956/02/15 Today's Date: 10/26/2021   History of Present Illness Jeanette Compton, 66 y.o. female, admitted 6/6 for a right anterior total hip revision. PMH:  DM, knee arthoscopy 1/23, prior right THA 2019    PT Comments    Pt supine in bed on entry requesting toothbrush and toothpaste, reports no pain. Plan for getting up and walking to bathroom to sit on BSC in front of sink to brush teeth. Pt requires modA for bed mobility due to increased pain with movement, min A for sit<>stand and ambulation with RW. Pt with increased pain in weightbearing, crying out with each step, however with encouragement and slowed pace pt able to walk into bathroom. Pt requires increased time to clean and place dentures. Pt requests to return to bed. PT advises against it and request pt stay out of bed most of the day and move frequently to decrease hip pain due to stiffness. Once up in recliner pt participates in seated exercise. Focus of next session on stair management for discharge home.    Recommendations for follow up therapy are one component of a multi-disciplinary discharge planning process, led by the attending physician.  Recommendations may be updated based on patient status, additional functional criteria and insurance authorization.  Follow Up Recommendations  Home health PT     Assistance Recommended at Discharge Set up Supervision/Assistance  Patient can return home with the following A little help with walking and/or transfers;A little help with bathing/dressing/bathroom;Assistance with cooking/housework;Assist for transportation;Help with stairs or ramp for entrance   Equipment Recommendations  BSC/3in1       Precautions / Restrictions Precautions Precautions: Anterior Hip Restrictions Weight Bearing Restrictions: Yes RLE Weight Bearing: Weight bearing as tolerated     Mobility  Bed Mobility Overal bed  mobility: Needs Assistance Bed Mobility: Sit to Supine     Supine to sit: Mod assist, HOB elevated     General bed mobility comments: modA for management of R LE across bed, vc to not use UE to move LE, and for sequencing movement to EoB, bridging to assist in movement of R LE    Transfers Overall transfer level: Needs assistance Equipment used: Rolling walker (2 wheels) Transfers: Sit to/from Stand Sit to Stand: Min assist, Min guard           General transfer comment: min A for power up from bed surface, min guard from [facility] with use of armrests    Ambulation/Gait Ambulation/Gait assistance: Min assist Gait Distance (Feet): 18 Feet (+10) Assistive device: Rolling walker (2 wheels) Gait Pattern/deviations: Decreased step length - right, Decreased step length - left, Step-to pattern, Decreased stance time - right, Decreased weight shift to right, Antalgic Gait velocity: slowed Gait velocity interpretation: <1.31 ft/sec, indicative of household ambulator   General Gait Details: min A progressing to min guard vc for proximity to RW increased triceps use instead of shoulders, pain limiting progression of ambulation      Balance Overall balance assessment: Needs assistance Sitting-balance support: No upper extremity supported, Feet supported Sitting balance-Leahy Scale: Fair     Standing balance support: Bilateral upper extremity supported, During functional activity Standing balance-Leahy Scale: Poor Standing balance comment: relies on UE support fot balance and to decr pain                            Cognition Arousal/Alertness: Awake/alert Behavior During Therapy: [facility]  for tasks assessed/performed Overall Cognitive Status: Within Functional Limits for tasks assessed                                          Exercises General Exercises - Lower Extremity Ankle Circles/Pumps: AROM, Both, 10 reps, Supine Long Arc Quad: AROM, Both, 10 reps,  Seated Hip Flexion/Marching: AROM, Both, 10 reps, Seated    General Comments General comments (skin integrity, edema, etc.): Pt reports concern with getting up steps into her home, educated on need to be out of bed most of the day and for movement of some type every hour      Pertinent Vitals/Pain Pain Assessment Pain Assessment: Faces Faces Pain Scale: Hurts whole lot Pain Location: right hip with movement and weightbearing, in supine reports no pain Pain Descriptors / Indicators: Crying, Aching, Burning Pain Intervention(s): Limited activity within patient's tolerance, Monitored during session, Premedicated before session     PT Goals (current goals can now be found in the care plan section) Acute Rehab PT Goals Patient Stated Goal: to go homee PT Goal Formulation: With patient Time For Goal Achievement: 11/08/21 Potential to Achieve Goals: Good Progress towards PT goals: Progressing toward goals    Frequency    7X/week      PT Plan Current plan remains appropriate       AM-PAC PT "6 Clicks" Mobility   Outcome Measure  Help needed turning from your back to your side while in a flat bed without using bedrails?: A Little Help needed moving from lying on your back to sitting on the side of a flat bed without using bedrails?: A Little Help needed moving to and from a bed to a chair (including a wheelchair)?: A Little Help needed standing up from a chair using your arms (e.g., wheelchair or bedside chair)?: A Little Help needed to walk in hospital room?: A Little Help needed climbing 3-5 steps with a railing? : A Little 6 Click Score: 18    End of Session Equipment Utilized During Treatment: Gait belt Activity Tolerance: Patient limited by pain Patient left: with call bell/phone within reach;in chair;with chair alarm set;with family/visitor present Nurse Communication: Mobility status PT Visit Diagnosis: Unsteadiness on feet (R26.81);Muscle weakness (generalized)  (M62.81);Pain Pain - Right/Left: Right Pain - part of body: Leg     Time: 0913-1002 PT Time Calculation (min) (ACUTE ONLY): 49 min  Charges:  $Gait Training: 8-22 mins $Therapeutic Exercise: 8-22 mins                     Rox Mcgriff B. Migdalia Dk PT, DPT Acute Rehabilitation Services Please use secure chat or  Call Office [phone]    [place] 10/26/2021, 10:17 AM

## 2021-10-26 NOTE — Progress Notes (Signed)
Subjective: 2 Days Post-Op Procedure(s) (LRB): RIGHT ANTERIOR HIP REVISION (Right) Patient reports pain as moderate.  Slow progress with PT. States hip is just sore.   Objective: Vital signs in last 24 hours: Temp:  [98.5 F (36.9 C)-100.5 F (38.1 C)] 99.9 F (37.7 C) (06/08 0824) Pulse Rate:  [79-94] 93 (06/08 0824) Resp:  [16-17] 16 (06/08 0824) BP: (102-127)/(53-62) 121/60 (06/08 0824) SpO2:  [94 %-100 %] 100 % (06/08 0824)  Intake/Output from previous day: 06/07 0701 - 06/08 0700 In: 1000 [I.V.:1000] Out: 1000 [Urine:1000] Intake/Output this shift: No intake/output data recorded.  Recent Labs    10/25/21 0315  HGB 10.5*   Recent Labs    10/25/21 0315  WBC 14.4*  RBC 3.87  HCT 33.4*  PLT 299   Recent Labs    10/25/21 0315  NA 138  K 4.3  CL 102  CO2 25  BUN 8  CREATININE 0.76  GLUCOSE 158*  CALCIUM 8.5*   No results for input(s): "LABPT", "INR" in the last 72 hours.  Sensation intact distally Intact pulses distally Dorsiflexion/Plantar flexion intact Incision: dressing C/D/I Compartment soft   Assessment/Plan: 2 Days Post-Op Procedure(s) (LRB): RIGHT ANTERIOR HIP REVISION (Right) Up with therapy     Jamae Tison 10/26/2021, 8:27 AM

## 2021-10-27 MED ORDER — ASPIRIN 81 MG PO CHEW: 81.0000 mg | CHEWABLE_TABLET | Freq: Two times a day (BID) | ORAL | 0 refills | Status: DC

## 2021-10-27 MED ORDER — TIZANIDINE HCL 4 MG PO TABS: 4.0000 mg | ORAL_TABLET | Freq: Three times a day (TID) | ORAL | 0 refills | Status: DC | PRN

## 2021-10-27 MED ORDER — OXYCODONE HCL 5 MG PO TABS: 5.0000 mg | ORAL_TABLET | ORAL | 0 refills | Status: DC | PRN

## 2021-10-27 NOTE — Discharge Summary (Signed)
Patient ID: Jeanette Compton MRN: [id number] DOB/AGE: 1955/10/25 66 y.o.  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 [facility]) Active Problems:   Status post revision of total hip   Discharge Diagnoses:  Same  Past Medical History:  Diagnosis Date   Arthritis    knee , ankle and back   Bronchitis    Cataract    Diabetes mellitus without complication ([place]) 1/66/0630   Hyperlipidemia    Hypertension    Hypothyroidism, adult 02/10/2019   Neuralgia, post-herpetic 11/13/2016   Obesity (BMI 30.0-34.9) 02/10/2019   Thyroid disease    Vitamin D deficiency disease 02/10/2019    Surgeries: Procedure(s): RIGHT ANTERIOR HIP REVISION on 10/24/2021   Consultants:   Discharged Condition: Improved  Hospital Course: Jeanette Compton is an 66 y.o. female who was admitted 10/24/2021 for operative treatment ofLoosening of femoral component of prosthetic right hip ([place]). Patient has severe unremitting pain that affects sleep, daily activities, and work/hobbies. After pre-op clearance the patient was taken to the operating room on 10/24/2021 and underwent  Procedure(s): RIGHT ANTERIOR HIP REVISION.    Patient was given perioperative antibiotics:  Anti-infectives (From admission, onward)    Start     Dose/Rate Route Frequency Ordered Stop   10/25/21 0600  ceFAZolin (ANCEF) IVPB 2g/100 mL premix        2 g 200 mL/hr over 30 Minutes Intravenous On call to O.R. 10/24/21 1202 10/24/21 1553   10/24/21 2200  ceFAZolin (ANCEF) IVPB 1 g/50 mL premix        1 g 100 mL/hr over 30 Minutes Intravenous Every 6 hours 10/24/21 1949 10/25/21 0324        Patient was given sequential compression devices, early ambulation, and chemoprophylaxis to prevent DVT.  Patient benefited maximally from hospital stay and there were no complications.    Recent vital signs: Patient Vitals for the past 24 hrs:  BP Temp Temp  src Pulse Resp SpO2  10/27/21 0819 (!) 113/52 98.6 F (37 C) Oral 86 18 99 %  10/27/21 0454 (!) 120/59 98.5 F (36.9 C) Oral 85 16 99 %  10/26/21 1943 (!) 108/54 99.6 F (37.6 C) Oral 91 16 97 %  10/26/21 1743 (!) 116/59 98.4 F (36.9 C) Oral 97 16 93 %     Recent laboratory studies:  Recent Labs    10/25/21 0315  WBC 14.4*  HGB 10.5*  HCT 33.4*  PLT 299  NA 138  K 4.3  CL 102  CO2 25  BUN 8  CREATININE 0.76  GLUCOSE 158*  CALCIUM 8.5*     Discharge Medications:   Allergies as of 10/27/2021       Reactions   Lisinopril Swelling, Other (See Comments)   Lips swelled        Medication List     TAKE these medications    aspirin 81 MG chewable tablet Chew 1 tablet (81 mg total) by mouth 2 (two) times daily.   Hair Skin and Nails Formula Tabs Take 2 tablets by mouth daily.   levothyroxine 75 MCG tablet Commonly known as: SYNTHROID TAKE 1 TABLET BY MOUTH DAILY   losartan-hydrochlorothiazide 100-12.5 MG tablet Commonly known as: HYZAAR TAKE 1 TABLET BY MOUTH DAILY   oxyCODONE 5 MG immediate release tablet Commonly known as: Oxy IR/ROXICODONE Take 1-2 tablets (5-10 mg total) by mouth every 4 (four) hours as needed for moderate pain (pain score 4-6).  rosuvastatin 5 MG tablet Commonly known as: Crestor Take 1 tablet (5 mg total) by mouth daily.   Rybelsus 7 MG Tabs Generic drug: Semaglutide TAKE 1 TABLET BY MOUTH DAILY   tiZANidine 4 MG tablet Commonly known as: Zanaflex Take 1 tablet (4 mg total) by mouth every 8 (eight) hours as needed for muscle spasms.   Vitamin D3 125 MCG (5000 UT) Caps Take 2 capsules by mouth daily.               Durable Medical Equipment  (From admission, onward)           Start     Ordered   10/24/21 1950  DME 3 n 1  Once        10/24/21 1949   10/24/21 1950  DME Walker rolling  Once       Question Answer Comment  Walker: With 5 Inch Wheels   Patient needs a walker to treat with the following condition  Status post revision of total hip      10/24/21 1949            Diagnostic Studies: DG Pelvis Portable  Result Date: 10/24/2021 CLINICAL DATA:  Status post revision right hip replacement EXAM: PORTABLE PELVIS 1-2 VIEWS COMPARISON:  12/13/2017, 10/24/2021 FINDINGS: Status post revision of right hip replacement. Intact hardware and normal alignment. No fracture is seen IMPRESSION: Interval revision of right hip replacement Electronically Signed   By: Donavan Foil M.D.   On: 10/24/2021 18:40   DG HIP UNILAT WITH PELVIS 1V RIGHT  Result Date: 10/24/2021 CLINICAL DATA:  Revision of right hip replacement. EXAM: DG HIP (WITH OR WITHOUT PELVIS) 1V RIGHT COMPARISON:  Preoperative radiograph 07/06/2021 FINDINGS: Three fluoroscopic spot views of the pelvis and right hip obtained in the operating room. Right hip arthroplasty in place. Fluoroscopy time 8 seconds. Dose 0.946 mGy. IMPRESSION: Intraoperative fluoroscopy during right hip arthroplasty revision. Electronically Signed   By: Keith Rake M.D.   On: 10/24/2021 18:07   DG C-Arm 1-60 Min-No Report  Result Date: 10/24/2021 Fluoroscopy was utilized by the requesting physician.  No radiographic interpretation.   DG C-Arm 1-60 Min-No Report  Result Date: 10/24/2021 Fluoroscopy was utilized by the requesting physician.  No radiographic interpretation.    Disposition: Discharge disposition: 01-Home or Self Care          Follow-up Information     Mcarthur Rossetti, MD Follow up in 2 week(s).   Specialty: Orthopedic Surgery Contact information: [place] [address] [phone]         Care, [facility] Follow up.   Specialty: Home Health Services Why: for home health services. they will call you in 1-2 days to set up your first home appointment Contact information: [address] [place] [address] [phone]                  Signed: Mcarthur Rossetti 10/27/2021,  1:08 PM

## 2021-10-27 NOTE — Progress Notes (Signed)
Patient ID: Jeanette Compton, female   DOB: 10-07-1955, 66 y.o.   MRN: [id number] Vitals signs stable.  Hopefully will be able to discharge to home this afternoon.

## 2021-10-27 NOTE — Progress Notes (Signed)
Physical Therapy Treatment Patient Details Name: Jeanette Compton MRN: [id number] DOB: Dec 15, 1955 Today's Date: 10/27/2021   History of Present Illness 66 y.o. female, admitted 6/6 for a right anterior total hip revision. PMH:  DM, knee arthoscopy 1/23, prior right THA 2019    PT Comments    Pt remains significantly limited by pain in hip, knee and ankle on RLE despite premedication. Pt only tolerating limited distance in room for gait and denied attempting stairs. Pt adamant that she will return home and that family will find a way to get her up the stairs. Pt educated for importance of HEP and mobility progression. Will continue to follow and encouraged OOB to bathroom to simulate home and increase mobility.    Recommendations for follow up therapy are one component of a multi-disciplinary discharge planning process, led by the attending physician.  Recommendations may be updated based on patient status, additional functional criteria and insurance authorization.  Follow Up Recommendations  Home health PT     Assistance Recommended at Discharge Intermittent Supervision/Assistance  Patient can return home with the following A little help with walking and/or transfers;A little help with bathing/dressing/bathroom;Assistance with cooking/housework;Assist for transportation;Help with stairs or ramp for entrance   Equipment Recommendations  BSC/3in1    Recommendations for Other Services       Precautions / Restrictions Precautions Precautions: Anterior Hip Precaution Comments: pt able to verbally review precautions Restrictions Weight Bearing Restrictions: Yes     Mobility  Bed Mobility Overal bed mobility: Needs Assistance Bed Mobility: Supine to Sit     Supine to sit: HOB elevated, Min assist     General bed mobility comments: min assist to move RLE toward EOb with HOB 30 degrees, increased time and use of rail to pivot to left, cues for sequence    Transfers Overall  transfer level: Needs assistance   Transfers: Sit to/from Stand Sit to Stand: Min guard           General transfer comment: minguard with cues for hand placement and sequence to rise from bed and BSC    Ambulation/Gait Ambulation/Gait assistance: Min guard Gait Distance (Feet): 18 Feet Assistive device: Rolling walker (2 wheels) Gait Pattern/deviations: Step-to pattern, Decreased stance time - right, Decreased step length - right, Trunk flexed   Gait velocity interpretation: <1.31 ft/sec, indicative of household ambulator   General Gait Details: cues for posture, sequence and progression. Pt maintaining a step to pattern with gait and only able to tolerate 18', then 10' of gait after toileting. pt refused further gait or stairs despite being adamant about return home   Stairs             Wheelchair Mobility    Modified Rankin (Stroke Patients Only)       Balance Overall balance assessment: Needs assistance Sitting-balance support: No upper extremity supported, Feet supported Sitting balance-Leahy Scale: Fair     Standing balance support: Bilateral upper extremity supported, During functional activity, Reliant on assistive device for balance Standing balance-Leahy Scale: Poor                              Cognition Arousal/Alertness: Awake/alert Behavior During Therapy: WFL for tasks assessed/performed Overall Cognitive Status: Impaired/Different from baseline Area of Impairment: Safety/judgement                         Safety/Judgement: Decreased awareness of deficits, Decreased awareness of safety  Exercises Total Joint Exercises Heel Slides: AAROM, Right, Supine, 10 reps Hip ABduction/ADduction: AAROM, Right, Supine (shoulder width as max abduction) Long Arc Quad: AROM, Right, Seated, 10 reps Marching in Standing: AAROM, Right, Seated, 10 reps    General Comments        Pertinent Vitals/Pain Pain  Assessment Faces Pain Scale: Hurts whole lot Pain Location: right ankle and hip Pain Descriptors / Indicators: Aching, Guarding Pain Intervention(s): Limited activity within patient's tolerance, Monitored during session, Premedicated before session, Repositioned    Home Living                          Prior Function            PT Goals (current goals can now be found in the care plan section) Progress towards PT goals: Progressing toward goals    Frequency    7X/week      PT Plan Current plan remains appropriate    Co-evaluation              AM-PAC PT "6 Clicks" Mobility   Outcome Measure  Help needed turning from your back to your side while in a flat bed without using bedrails?: A Little Help needed moving from lying on your back to sitting on the side of a flat bed without using bedrails?: A Little Help needed moving to and from a bed to a chair (including a wheelchair)?: A Little Help needed standing up from a chair using your arms (e.g., wheelchair or bedside chair)?: A Little Help needed to walk in hospital room?: A Lot Help needed climbing 3-5 steps with a railing? : A Lot 6 Click Score: 16    End of Session   Activity Tolerance: Patient limited by pain Patient left: in chair;with call bell/phone within reach;with chair alarm set Nurse Communication: Mobility status PT Visit Diagnosis: Unsteadiness on feet (R26.81);Muscle weakness (generalized) (M62.81);Pain     Time: 1046-1110 PT Time Calculation (min) (ACUTE ONLY): 24 min  Charges:  $Gait Training: 8-22 mins $Therapeutic Exercise: 8-22 mins                     Bayard Males, PT Acute Rehabilitation Services Office: [place] 10/27/2021, 11:42 AM

## 2021-10-27 NOTE — Care Management Important Message (Signed)
Important Message  Patient Details  Name: Jeanette Compton MRN: [id number] Date of Birth: 1955-10-15   Medicare Important Message Given:  Yes     Orbie Pyo 10/27/2021, 2:21 PM

## 2021-10-27 NOTE — Progress Notes (Signed)
Patient ID: Jeanette Compton, female   DOB: 01/31/1956, 66 y.o.   MRN: [id number] Right hip stable.  Mobilizing safely.  Dressing changed and incision looks good.  Can be discharged to home this afternoon.

## 2021-10-27 NOTE — Progress Notes (Signed)
RN gave patient discharge instructions and the patient stated understanding. IV has been removed and belongings have been packed, waiting for ride, DME at bedside

## 2021-10-30 ENCOUNTER — Telehealth: Payer: Self-pay

## 2021-10-30 NOTE — Telephone Encounter (Signed)
Transition Care Management Unsuccessful Follow-up Telephone Call  Date of discharge and from where:  10/27/21 Jeanette Compton  Attempts:  1st Attempt  Reason for unsuccessful TCM follow-up call:  Unable to leave message

## 2021-11-01 DIAGNOSIS — E119 Type 2 diabetes mellitus without complications: Secondary | ICD-10-CM | POA: Diagnosis not present

## 2021-11-01 DIAGNOSIS — T84030D Mechanical loosening of internal right hip prosthetic joint, subsequent encounter: Secondary | ICD-10-CM | POA: Diagnosis not present

## 2021-11-01 DIAGNOSIS — E785 Hyperlipidemia, unspecified: Secondary | ICD-10-CM | POA: Diagnosis not present

## 2021-11-01 DIAGNOSIS — M19079 Primary osteoarthritis, unspecified ankle and foot: Secondary | ICD-10-CM | POA: Diagnosis not present

## 2021-11-01 DIAGNOSIS — Z86010 Personal history of colonic polyps: Secondary | ICD-10-CM | POA: Diagnosis not present

## 2021-11-01 DIAGNOSIS — Z7982 Long term (current) use of aspirin: Secondary | ICD-10-CM | POA: Diagnosis not present

## 2021-11-01 DIAGNOSIS — E039 Hypothyroidism, unspecified: Secondary | ICD-10-CM | POA: Diagnosis not present

## 2021-11-01 DIAGNOSIS — I10 Essential (primary) hypertension: Secondary | ICD-10-CM | POA: Diagnosis not present

## 2021-11-01 DIAGNOSIS — G8929 Other chronic pain: Secondary | ICD-10-CM | POA: Diagnosis not present

## 2021-11-01 DIAGNOSIS — M1710 Unilateral primary osteoarthritis, unspecified knee: Secondary | ICD-10-CM | POA: Diagnosis not present

## 2021-11-01 DIAGNOSIS — Z87891 Personal history of nicotine dependence: Secondary | ICD-10-CM | POA: Diagnosis not present

## 2021-11-01 DIAGNOSIS — Z9181 History of falling: Secondary | ICD-10-CM | POA: Diagnosis not present

## 2021-11-01 DIAGNOSIS — E559 Vitamin D deficiency, unspecified: Secondary | ICD-10-CM | POA: Diagnosis not present

## 2021-11-03 DIAGNOSIS — E039 Hypothyroidism, unspecified: Secondary | ICD-10-CM | POA: Diagnosis not present

## 2021-11-03 DIAGNOSIS — Z9181 History of falling: Secondary | ICD-10-CM | POA: Diagnosis not present

## 2021-11-03 DIAGNOSIS — G8929 Other chronic pain: Secondary | ICD-10-CM | POA: Diagnosis not present

## 2021-11-03 DIAGNOSIS — Z86010 Personal history of colonic polyps: Secondary | ICD-10-CM | POA: Diagnosis not present

## 2021-11-03 DIAGNOSIS — E785 Hyperlipidemia, unspecified: Secondary | ICD-10-CM | POA: Diagnosis not present

## 2021-11-03 DIAGNOSIS — M19079 Primary osteoarthritis, unspecified ankle and foot: Secondary | ICD-10-CM | POA: Diagnosis not present

## 2021-11-03 DIAGNOSIS — M1710 Unilateral primary osteoarthritis, unspecified knee: Secondary | ICD-10-CM | POA: Diagnosis not present

## 2021-11-03 DIAGNOSIS — I10 Essential (primary) hypertension: Secondary | ICD-10-CM | POA: Diagnosis not present

## 2021-11-03 DIAGNOSIS — T84030D Mechanical loosening of internal right hip prosthetic joint, subsequent encounter: Secondary | ICD-10-CM | POA: Diagnosis not present

## 2021-11-03 DIAGNOSIS — Z7982 Long term (current) use of aspirin: Secondary | ICD-10-CM | POA: Diagnosis not present

## 2021-11-03 DIAGNOSIS — E119 Type 2 diabetes mellitus without complications: Secondary | ICD-10-CM | POA: Diagnosis not present

## 2021-11-03 DIAGNOSIS — Z87891 Personal history of nicotine dependence: Secondary | ICD-10-CM | POA: Diagnosis not present

## 2021-11-03 DIAGNOSIS — E559 Vitamin D deficiency, unspecified: Secondary | ICD-10-CM | POA: Diagnosis not present

## 2021-11-06 ENCOUNTER — Ambulatory Visit (INDEPENDENT_AMBULATORY_CARE_PROVIDER_SITE_OTHER): Payer: Medicare Other | Admitting: Orthopaedic Surgery

## 2021-11-06 ENCOUNTER — Encounter: Payer: Self-pay | Admitting: Orthopaedic Surgery

## 2021-11-06 DIAGNOSIS — Z96649 Presence of unspecified artificial hip joint: Secondary | ICD-10-CM

## 2021-11-06 MED ORDER — OXYCODONE HCL 5 MG PO TABS: 5.0000 mg | ORAL_TABLET | Freq: Four times a day (QID) | ORAL | 0 refills | Status: DC | PRN

## 2021-11-06 NOTE — Progress Notes (Signed)
The patient is here status post a right total hip arthroplasty revision.  There was loosening of her femoral component.  This was aseptic loosening.  We also exchanged her polyethylene liner and placed a new hip ball.  We did find the femoral component loose and no evidence of infection.  Her right hip incision looks good.  The staples were removed and Steri-Strips applied.  She does have a large seroma and I did aspirate at least 100 cc off of the soft tissue.  There is no evidence of infection.  Her calf is soft and there is no significant foot and ankle swelling.  I will have her stop taking her aspirin.  She will continue to increase her activities as comfort allows.  I would like to see her back in 4 weeks to see how she is doing and I would actually like a standing AP pelvis and lateral of her right hip at that visit.  I will refill her pain medication as well.

## 2021-11-08 DIAGNOSIS — Z87891 Personal history of nicotine dependence: Secondary | ICD-10-CM | POA: Diagnosis not present

## 2021-11-08 DIAGNOSIS — E559 Vitamin D deficiency, unspecified: Secondary | ICD-10-CM | POA: Diagnosis not present

## 2021-11-08 DIAGNOSIS — I10 Essential (primary) hypertension: Secondary | ICD-10-CM | POA: Diagnosis not present

## 2021-11-08 DIAGNOSIS — E119 Type 2 diabetes mellitus without complications: Secondary | ICD-10-CM | POA: Diagnosis not present

## 2021-11-08 DIAGNOSIS — M19079 Primary osteoarthritis, unspecified ankle and foot: Secondary | ICD-10-CM | POA: Diagnosis not present

## 2021-11-08 DIAGNOSIS — Z86010 Personal history of colonic polyps: Secondary | ICD-10-CM | POA: Diagnosis not present

## 2021-11-08 DIAGNOSIS — E785 Hyperlipidemia, unspecified: Secondary | ICD-10-CM | POA: Diagnosis not present

## 2021-11-08 DIAGNOSIS — G8929 Other chronic pain: Secondary | ICD-10-CM | POA: Diagnosis not present

## 2021-11-08 DIAGNOSIS — E039 Hypothyroidism, unspecified: Secondary | ICD-10-CM | POA: Diagnosis not present

## 2021-11-08 DIAGNOSIS — T84030D Mechanical loosening of internal right hip prosthetic joint, subsequent encounter: Secondary | ICD-10-CM | POA: Diagnosis not present

## 2021-11-08 DIAGNOSIS — Z9181 History of falling: Secondary | ICD-10-CM | POA: Diagnosis not present

## 2021-11-08 DIAGNOSIS — Z7982 Long term (current) use of aspirin: Secondary | ICD-10-CM | POA: Diagnosis not present

## 2021-11-08 DIAGNOSIS — M1710 Unilateral primary osteoarthritis, unspecified knee: Secondary | ICD-10-CM | POA: Diagnosis not present

## 2021-11-09 ENCOUNTER — Telehealth: Payer: Self-pay

## 2021-11-09 NOTE — Telephone Encounter (Signed)
Patient called stating that she has fluid again on her right hip.  Would like to know if she needs to be seen today or can she wait until her appointment on Monday, 11/13/2021.  Patient had right hip surgery on 10/24/2021. CB# [phone].  Please advise.  Thank you.

## 2021-11-09 NOTE — Telephone Encounter (Signed)
Please advise 

## 2021-11-13 ENCOUNTER — Ambulatory Visit (INDEPENDENT_AMBULATORY_CARE_PROVIDER_SITE_OTHER): Payer: Medicare Other | Admitting: Physician Assistant

## 2021-11-13 ENCOUNTER — Ambulatory Visit: Payer: Self-pay

## 2021-11-13 ENCOUNTER — Encounter: Payer: Self-pay | Admitting: Physician Assistant

## 2021-11-13 DIAGNOSIS — Z96649 Presence of unspecified artificial hip joint: Secondary | ICD-10-CM

## 2021-11-13 NOTE — Progress Notes (Signed)
HPI: [name] returns today status post right revision total hip arthroplasty 10/24/2021.  She states that the swelling came back in her right hip.  States pain is 6 out of 10 at worst.  She is starting get around some with a cane.  Physical exam: Right hip incision is healing well.  Positive seroma.  100 cc aspirated today patient tolerates well.  She is able to ambulate on her own and get on and off the exam table on her own.  Impression: Status post right total hip revision  Plan: She will follow-up as scheduled in approximately 3 weeks obtain an AP pelvis lateral view right hip at that time.  She will work on scar tissue mobilization.  Questions encouraged and answered at length.

## 2021-11-15 DIAGNOSIS — J449 Chronic obstructive pulmonary disease, unspecified: Secondary | ICD-10-CM | POA: Diagnosis not present

## 2021-11-20 ENCOUNTER — Other Ambulatory Visit: Payer: Self-pay | Admitting: Physician Assistant

## 2021-11-20 ENCOUNTER — Telehealth: Payer: Self-pay | Admitting: Orthopaedic Surgery

## 2021-11-20 MED ORDER — OXYCODONE HCL 5 MG PO TABS: 5.0000 mg | ORAL_TABLET | Freq: Four times a day (QID) | ORAL | 0 refills | Status: DC | PRN

## 2021-11-20 NOTE — Telephone Encounter (Signed)
Please advise 

## 2021-11-20 NOTE — Telephone Encounter (Signed)
Patient called in wanting to refill Rx Oxycodone and muscle relaxer Tinzandine please advise

## 2021-12-04 ENCOUNTER — Encounter: Payer: Medicare Other | Admitting: Orthopaedic Surgery

## 2021-12-11 ENCOUNTER — Encounter: Payer: Self-pay | Admitting: Physician Assistant

## 2021-12-11 ENCOUNTER — Ambulatory Visit (INDEPENDENT_AMBULATORY_CARE_PROVIDER_SITE_OTHER): Payer: Medicare Other | Admitting: Physician Assistant

## 2021-12-11 ENCOUNTER — Ambulatory Visit (INDEPENDENT_AMBULATORY_CARE_PROVIDER_SITE_OTHER): Payer: Medicare Other

## 2021-12-11 DIAGNOSIS — Z96649 Presence of unspecified artificial hip joint: Secondary | ICD-10-CM | POA: Diagnosis not present

## 2021-12-11 MED ORDER — OXYCODONE HCL 5 MG PO TABS: 5.0000 mg | ORAL_TABLET | Freq: Four times a day (QID) | ORAL | 0 refills | Status: DC | PRN

## 2021-12-11 MED ORDER — METHOCARBAMOL 500 MG PO TABS: 500.0000 mg | ORAL_TABLET | Freq: Four times a day (QID) | ORAL | 1 refills | Status: DC

## 2021-12-11 NOTE — Progress Notes (Signed)
HPI: [name] returns today status post right total hip arthroplasty components 10/24/2021.  She is overall doing well.  She is taking occasional pain medication and wants refill on the pain medicine and also methocarbamol.  She reports she is able to go up and down the stairs at her home.  She denies any fevers chills.  Physical exam: Right hip good range of motion without pain.  Calf supple nontender.  Dorsiflexion plantarflexion ankle intact.  Surgical incisions healing well no signs of infection abnormal warmth or erythema.  Radiographs: AP pelvis lateral view of the right hip: Bilateral hips well located.  Right total hip arthroplasty components well-seated.  No acute findings.  Impression: Status post right total hip arthroplasty revision 10/24/2021  Plan: We will see her back in 1 month to see how she is doing overall.  Refill on her pain medication and muscle relaxants worsening today.  Questions encouraged and answered at length

## 2021-12-15 DIAGNOSIS — J449 Chronic obstructive pulmonary disease, unspecified: Secondary | ICD-10-CM | POA: Diagnosis not present

## 2021-12-21 ENCOUNTER — Encounter: Payer: Medicare Other | Admitting: Internal Medicine

## 2022-01-09 ENCOUNTER — Encounter: Payer: Self-pay | Admitting: Internal Medicine

## 2022-01-09 ENCOUNTER — Ambulatory Visit (INDEPENDENT_AMBULATORY_CARE_PROVIDER_SITE_OTHER): Payer: Medicare Other | Admitting: Internal Medicine

## 2022-01-09 VITALS — BP 140/68 | HR 80 | Ht 66.0 in | Wt 183.4 lb

## 2022-01-09 DIAGNOSIS — I10 Essential (primary) hypertension: Secondary | ICD-10-CM

## 2022-01-09 DIAGNOSIS — E039 Hypothyroidism, unspecified: Secondary | ICD-10-CM

## 2022-01-09 DIAGNOSIS — Z0001 Encounter for general adult medical examination with abnormal findings: Secondary | ICD-10-CM

## 2022-01-09 DIAGNOSIS — E782 Mixed hyperlipidemia: Secondary | ICD-10-CM | POA: Diagnosis not present

## 2022-01-09 DIAGNOSIS — Z96649 Presence of unspecified artificial hip joint: Secondary | ICD-10-CM

## 2022-01-09 DIAGNOSIS — E1169 Type 2 diabetes mellitus with other specified complication: Secondary | ICD-10-CM | POA: Diagnosis not present

## 2022-01-09 DIAGNOSIS — E559 Vitamin D deficiency, unspecified: Secondary | ICD-10-CM | POA: Diagnosis not present

## 2022-01-09 DIAGNOSIS — E669 Obesity, unspecified: Secondary | ICD-10-CM

## 2022-01-09 NOTE — Patient Instructions (Addendum)
Please continue taking medications as prescribed.  Please continue to follow low carb diet and perform moderate exercise/walking at least 150 mins/week.  Please get fasting blood tests done before the next visit. 

## 2022-01-11 ENCOUNTER — Encounter: Payer: Self-pay | Admitting: Orthopaedic Surgery

## 2022-01-11 ENCOUNTER — Ambulatory Visit (INDEPENDENT_AMBULATORY_CARE_PROVIDER_SITE_OTHER): Payer: Medicare Other | Admitting: Orthopaedic Surgery

## 2022-01-11 DIAGNOSIS — Z96649 Presence of unspecified artificial hip joint: Secondary | ICD-10-CM

## 2022-01-11 MED ORDER — DOXYCYCLINE HYCLATE 100 MG PO CAPS: 100.0000 mg | ORAL_CAPSULE | Freq: Two times a day (BID) | ORAL | 0 refills | Status: DC

## 2022-01-11 MED ORDER — OXYCODONE HCL 5 MG PO TABS: 5.0000 mg | ORAL_TABLET | Freq: Four times a day (QID) | ORAL | 0 refills | Status: DC | PRN

## 2022-01-11 NOTE — Progress Notes (Signed)
The patient is 10 weeks status post revision arthroplasty of a failed femoral component from a total hip from the right side.  This was aseptic loosening.  She has been doing well but she has reported some drainage in the bottom of her incision.  There is a small area at the bottom of her incision that I unroofed.  It looks like this is likely a suture abscess.  It did not probe deep and I cannot express any significant purulence from the area but it is worrisome.  I would have her place mupirocin ointment on this wound daily and to clean it daily in the shower.  I will start her on doxycycline and would like to see her back in just 1 week for repeat wound check and assessment.

## 2022-01-12 NOTE — Assessment & Plan Note (Signed)
Check lipid profile On Crestor 

## 2022-01-12 NOTE — Assessment & Plan Note (Signed)
BP Readings from Last 1 Encounters:  01/09/22 (!) 140/68   Elevated today likely due to hip pain Usually well-controlled with Losartan-HCTZ 100-12.5 mg QD Counseled for compliance with the medications Advised DASH diet and moderate exercise/walking, at least 150 mins/week

## 2022-01-12 NOTE — Assessment & Plan Note (Signed)

## 2022-01-12 NOTE — Assessment & Plan Note (Signed)
Lab Results  Component Value Date   TSH 0.915 04/05/2021   On levothyroxine 75 mcg daily Check TSH and free T4

## 2022-01-12 NOTE — Assessment & Plan Note (Signed)
Lab Results  Component Value Date   HGBA1C 6.3 (H) 10/18/2021   Well-controlled On Rybelsus 7 mg QD Advised to follow diabetic diet On ARB F/u HbA1C, CMP and lipid panel Diabetic eye exam: Advised to follow up with Ophthalmology for diabetic eye exam

## 2022-01-12 NOTE — Progress Notes (Signed)
Established Patient Office Visit  Subjective:  Patient ID: Jeanette Compton, female    DOB: 05/02/1956  Age: 66 y.o. MRN: [id number]  CC:  Chief Complaint  Patient presents with   Annual Exam    Cpe arms were sore from using walker but better now, cataracts getting worse    HPI AVARY Compton is a 66 y.o. female with past medical history of HTN, type II DM, hypothyroidism, OA of hip and knee, vitiligo and obesity who presents for annual physical.  HTN: Her BP is well controlled at home now.  She has started taking losartan HCTZ 100-12.5 milligrams daily.  She denies any headache, dizziness, chest pain, dyspnea or palpitations.   Type II DM: She has ben taking Rybelsus for it. Her HbA1C was 6.3 in 05/23. She denies any fatigue, polyuria, polydipsia or polyphagia. She has lost about 22 lbs since starting Rybelsus.  She complains of right hip pain, which is worse with movement and standing.  She has had right THA, which needed revision surgery, followed by Orthopedic surgery.  She denies any numbness or tingling of the LE.    Past Medical History:  Diagnosis Date   Arthritis    knee , ankle and back   Bronchitis    Cataract    Diabetes mellitus without complication ([place]) 5/78/4696   Hyperlipidemia    Hypertension    Hypothyroidism, adult 02/10/2019   Neuralgia, post-herpetic 11/13/2016   Obesity (BMI 30.0-34.9) 02/10/2019   Thyroid disease    Vitamin D deficiency disease 02/10/2019    Past Surgical History:  Procedure Laterality Date   ABDOMINAL HYSTERECTOMY     fibroid   ANTERIOR HIP REVISION Right 10/24/2021   Procedure: RIGHT ANTERIOR HIP REVISION;  Surgeon: Mcarthur Rossetti, MD;  Location: [facility];  Service: Orthopedics;  Laterality: Right;   BREAST BIOPSY Right 07/31/2017   Procedure: EXCISIONAL BREAST BIOPSY WITH NEEDLE LOCALIZATION;  Surgeon: Virl Cagey, MD;  Location: [facility];  Service: General;  Laterality: Right;   BREAST SURGERY Right    COLONOSCOPY   09/13/2011   Procedure: COLONOSCOPY;  Surgeon: Rogene Houston, MD;  Location: [facility];  Service: Endoscopy;  Laterality: N/A;  1030   COLONOSCOPY N/A 07/31/2018   Procedure: COLONOSCOPY;  Surgeon: Rogene Houston, MD;  Location: [facility];  Service: Endoscopy;  Laterality: N/A;   POLYPECTOMY  07/31/2018   Procedure: POLYPECTOMY;  Surgeon: Rogene Houston, MD;  Location: [facility];  Service: Endoscopy;;   TOTAL HIP ARTHROPLASTY Right 12/13/2017   Procedure: RIGHT TOTAL HIP ARTHROPLASTY ANTERIOR APPROACH;  Surgeon: Mcarthur Rossetti, MD;  Location: [facility];  Service: Orthopedics;  Laterality: Right;    Family History  Problem Relation Age of Onset   Stroke Mother    Diabetes Mother    Alcohol abuse Father    Early death Father    Early death Sister        pneumonia   Cancer Brother    Alzheimer's disease Sister    COPD Brother    Colon cancer Neg Hx     Social History   Socioeconomic History   Marital status: Single    Spouse name: Not on file   Number of children: 3   Years of education: 10   Highest education level: Not on file  Occupational History   Occupation: disability  Tobacco Use   Smoking status: Former    Packs/day: 0.50    Years: 3.00  Total pack years: 1.50    Types: Cigarettes    Quit date: 26    Years since quitting: 42.6   Smokeless tobacco: Never  Vaping Use   Vaping Use: Never used  Substance and Sexual Activity   Alcohol use: No   Drug use: Yes    Types: Marijuana   Sexual activity: Never    Birth control/protection: Surgical  Other Topics Concern   Not on file  Social History Narrative      Comments: Divorced x 3. Single. Has 3 children (2 boys and 1 girl), 9 grandchildren, 1 great-grandchild. Disability from arthritis in back. 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      Social Determinants of Health   Financial Resource Strain: Low Risk  (03/22/2021)   Overall  Financial Resource Strain (CARDIA)    Difficulty of Paying Living Expenses: Not hard at all  Food Insecurity: No Food Insecurity (03/22/2021)   Hunger Vital Sign    Worried About Running Out of Food in the Last Year: Never true    Ran Out of Food in the Last Year: Never true  Transportation Needs: No Transportation Needs (03/22/2021)   PRAPARE - Hydrologist (Medical): No    Lack of Transportation (Non-Medical): No  Physical Activity: Insufficiently Active (03/22/2021)   Exercise Vital Sign    Days of Exercise per Week: 5 days    Minutes of Exercise per Session: 20 min  Stress: No Stress Concern Present (03/22/2021)   [place]    Feeling of Stress : Not at all  Social Connections: Moderately Isolated (03/22/2021)   Social Connection and Isolation Panel [NHANES]    Frequency of Communication with Friends and Family: More than three times a week    Frequency of Social Gatherings with Friends and Family: More than three times a week    Attends Religious Services: More than 4 times per year    Active Member of [employer] or Organizations: No    Attends Archivist Meetings: Never    Marital Status: Divorced  Human resources officer Violence: Not At Risk (03/22/2021)   Humiliation, Afraid, Rape, and Kick questionnaire    Fear of Current or Ex-Partner: No    Emotionally Abused: No    Physically Abused: No    Sexually Abused: No    Outpatient Medications Prior to Visit  Medication Sig Dispense Refill   Cholecalciferol (VITAMIN D3) 125 MCG (5000 UT) CAPS Take 2 capsules by mouth daily.      levothyroxine (SYNTHROID) 75 MCG tablet TAKE 1 TABLET BY MOUTH DAILY 90 tablet 3   losartan-hydrochlorothiazide (HYZAAR) 100-12.5 MG tablet TAKE 1 TABLET BY MOUTH DAILY 90 tablet 3   Multiple Vitamins-Minerals (HAIR SKIN AND NAILS FORMULA) TABS Take 2 tablets by mouth daily.     rosuvastatin (CRESTOR) 5 MG tablet  Take 1 tablet (5 mg total) by mouth daily. 90 tablet 1   RYBELSUS 7 MG TABS TAKE 1 TABLET BY MOUTH DAILY 90 tablet 3   methocarbamol (ROBAXIN) 500 MG tablet Take 1 tablet (500 mg total) by mouth 4 (four) times daily. (Patient not taking: Reported on 01/09/2022) 40 tablet 1   oxyCODONE (OXY IR/ROXICODONE) 5 MG immediate release tablet Take 1 tablet (5 mg total) by mouth every 6 (six) hours as needed for moderate pain (pain score 4-6). (Patient not taking: Reported on 01/09/2022) 30 tablet 0  No facility-administered medications prior to visit.    Allergies  Allergen Reactions   Lisinopril Swelling and Other (See Comments)    Lips swelled    ROS Review of Systems  Constitutional:  Negative for chills and fever.  HENT:  Negative for congestion, sinus pressure, sinus pain and sore throat.   Eyes:  Negative for pain and discharge.  Respiratory:  Negative for cough and shortness of breath.   Cardiovascular:  Negative for chest pain and palpitations.  Gastrointestinal:  Negative for abdominal pain, constipation, diarrhea, nausea and vomiting.  Endocrine: Negative for polydipsia and polyuria.  Genitourinary:  Negative for dysuria and hematuria.  Musculoskeletal:  Positive for arthralgias (R hip and knee). Negative for neck pain and neck stiffness.  Skin:  Negative for rash.  Neurological:  Negative for dizziness and weakness.  Psychiatric/Behavioral:  Negative for agitation and behavioral problems.       Objective:    Physical Exam Vitals reviewed.  Constitutional:      General: She is not in acute distress.    Appearance: She is not diaphoretic.  HENT:     Head: Normocephalic and atraumatic.     Nose: Nose normal. No congestion.     Mouth/Throat:     Mouth: Mucous membranes are moist.     Pharynx: No posterior oropharyngeal erythema.  Eyes:     General: No scleral icterus.    Extraocular Movements: Extraocular movements intact.  Cardiovascular:     Rate and Rhythm: Normal rate  and regular rhythm.     Pulses: Normal pulses.     Heart sounds: Normal heart sounds. No murmur heard. Pulmonary:     Breath sounds: Normal breath sounds. No wheezing or rales.  Abdominal:     Palpations: Abdomen is soft.     Tenderness: There is no abdominal tenderness.  Musculoskeletal:     Cervical back: Neck supple. No tenderness.     Right lower leg: No edema.     Left lower leg: No edema.  Skin:    General: Skin is warm.     Findings: No rash.  Neurological:     General: No focal deficit present.     Mental Status: She is alert and oriented to person, place, and time.     Sensory: No sensory deficit.     Motor: No weakness.  Psychiatric:        Mood and Affect: Mood normal.        Behavior: Behavior normal.     BP (!) 140/68 (BP Location: Right Arm, Cuff Size: Normal)   Pulse 80   Ht $R'5\' 6"'xU$  (1.676 m)   Wt 183 lb 6.4 oz (83.2 kg)   SpO2 97%   BMI 29.60 kg/m  Wt Readings from Last 3 Encounters:  01/09/22 183 lb 6.4 oz (83.2 kg)  10/24/21 192 lb (87.1 kg)  10/18/21 192 lb 1.6 oz (87.1 kg)    Lab Results  Component Value Date   TSH 0.915 04/05/2021   Lab Results  Component Value Date   WBC 14.4 (H) 10/25/2021   HGB 10.5 (L) 10/25/2021   HCT 33.4 (L) 10/25/2021   MCV 86.3 10/25/2021   PLT 299 10/25/2021   Lab Results  Component Value Date   NA 138 10/25/2021   K 4.3 10/25/2021   CO2 25 10/25/2021   GLUCOSE 158 (H) 10/25/2021   BUN 8 10/25/2021   CREATININE 0.76 10/25/2021   BILITOT 0.4 04/05/2021   ALKPHOS 94 04/05/2021   AST  13 04/05/2021   ALT 10 04/05/2021   PROT 7.8 04/05/2021   ALBUMIN 4.7 04/05/2021   CALCIUM 8.5 (L) 10/25/2021   ANIONGAP 11 10/25/2021   EGFR 92 04/05/2021   Lab Results  Component Value Date   CHOL 232 (H) 04/05/2021   Lab Results  Component Value Date   HDL 51 04/05/2021   Lab Results  Component Value Date   LDLCALC 145 (H) 04/05/2021   Lab Results  Component Value Date   TRIG 201 (H) 04/05/2021   Lab Results   Component Value Date   CHOLHDL 4.5 (H) 04/05/2021   Lab Results  Component Value Date   HGBA1C 6.3 (H) 10/18/2021      Assessment & Plan:   Problem List Items Addressed This Visit       Cardiovascular and Mediastinum   Essential hypertension    BP Readings from Last 1 Encounters:  01/09/22 (!) 140/68  Elevated today likely due to hip pain Usually well-controlled with Losartan-HCTZ 100-12.5 mg QD Counseled for compliance with the medications Advised DASH diet and moderate exercise/walking, at least 150 mins/week      Relevant Orders   CBC with Differential/Platelet     Endocrine   Hypothyroidism, adult    Lab Results  Component Value Date   TSH 0.915 04/05/2021  On levothyroxine 75 mcg daily Check TSH and free T4      Relevant Orders   TSH + free T4   Diabetes mellitus type 2 in obese [facility])    Lab Results  Component Value Date   HGBA1C 6.3 (H) 10/18/2021  Well-controlled On Rybelsus 7 mg QD Advised to follow diabetic diet On ARB F/u HbA1C, CMP and lipid panel Diabetic eye exam: Advised to follow up with Ophthalmology for diabetic eye exam      Relevant Orders   Hemoglobin A1c   CMP14+EGFR     Other   HLD (hyperlipidemia)    Check lipid profile On Crestor      Relevant Orders   Lipid panel   Vitamin D deficiency   Relevant Orders   VITAMIN D 25 Hydroxy (Vit-D Deficiency, Fractures)   Encounter for general adult medical examination with abnormal findings - Primary    Physical exam as documented. Counseling done  re healthy lifestyle involving commitment to 150 minutes exercise per week, heart healthy diet, and attaining healthy weight.The importance of adequate sleep also discussed. Changes in health habits are decided on by the patient with goals and time frames  set for achieving them. Immunization and cancer screening needs are specifically addressed at this visit.      Relevant Orders   CMP14+EGFR   CBC with Differential/Platelet   Status  post revision of total hip replacement    S/p right hip arthroplasty, needed revision surgery Followed by Orthopedic surgery       No orders of the defined types were placed in this encounter.   Follow-up: Return in about 4 months (around 05/11/2022) for DM and HTN.    Lindell Spar, MD

## 2022-01-12 NOTE — Assessment & Plan Note (Signed)
S/p right hip arthroplasty, needed revision surgery Followed by Orthopedic surgery

## 2022-01-15 DIAGNOSIS — J449 Chronic obstructive pulmonary disease, unspecified: Secondary | ICD-10-CM | POA: Diagnosis not present

## 2022-02-07 ENCOUNTER — Ambulatory Visit: Payer: Medicare Other

## 2022-02-15 DIAGNOSIS — J449 Chronic obstructive pulmonary disease, unspecified: Secondary | ICD-10-CM | POA: Diagnosis not present

## 2022-03-14 ENCOUNTER — Ambulatory Visit (INDEPENDENT_AMBULATORY_CARE_PROVIDER_SITE_OTHER): Payer: Medicare Other

## 2022-03-14 DIAGNOSIS — Z23 Encounter for immunization: Secondary | ICD-10-CM | POA: Diagnosis not present

## 2022-03-17 DIAGNOSIS — J449 Chronic obstructive pulmonary disease, unspecified: Secondary | ICD-10-CM | POA: Diagnosis not present

## 2022-04-11 ENCOUNTER — Ambulatory Visit (INDEPENDENT_AMBULATORY_CARE_PROVIDER_SITE_OTHER): Payer: Medicare Other

## 2022-04-11 DIAGNOSIS — Z0000 Encounter for general adult medical examination without abnormal findings: Secondary | ICD-10-CM | POA: Diagnosis not present

## 2022-04-11 NOTE — Progress Notes (Signed)
Subjective:   Jeanette Compton is a 66 y.o. female who presents for Medicare Annual (Subsequent) preventive examination. I connected with  Jeanette Compton on 04/11/22 by a audio enabled telemedicine application and verified that I am speaking with the correct person using two identifiers.  Patient Location: Home  Provider Location: Office/Clinic  I discussed the limitations of evaluation and management by telemedicine. The patient expressed understanding and agreed to proceed.  Review of Systems           Objective:    There were no vitals filed for this visit. There is no height or weight on file to calculate BMI.     10/18/2021   10:14 AM 04/24/2021    5:29 PM 03/22/2021    2:56 PM 07/31/2018   10:57 AM 12/13/2017   10:40 AM 12/10/2017    1:24 PM 07/31/2017    7:18 AM  Advanced Directives  Does Patient Have a Medical Advance Directive? No No No No No No No  Would patient like information on creating a medical advance directive? No - Patient declined No - Patient declined No - Patient declined No - Patient declined No - Patient declined No - Patient declined No - Patient declined    Current Medications (verified) Outpatient Encounter Medications as of 04/11/2022  Medication Sig   Cholecalciferol (VITAMIN D3) 125 MCG (5000 UT) CAPS Take 2 capsules by mouth daily.    doxycycline (VIBRAMYCIN) 100 MG capsule Take 1 capsule (100 mg total) by mouth 2 (two) times daily.   levothyroxine (SYNTHROID) 75 MCG tablet TAKE 1 TABLET BY MOUTH DAILY   losartan-hydrochlorothiazide (HYZAAR) 100-12.5 MG tablet TAKE 1 TABLET BY MOUTH DAILY   methocarbamol (ROBAXIN) 500 MG tablet Take 1 tablet (500 mg total) by mouth 4 (four) times daily. (Patient not taking: Reported on 01/09/2022)   Multiple Vitamins-Minerals (HAIR SKIN AND NAILS FORMULA) TABS Take 2 tablets by mouth daily.   oxyCODONE (OXY IR/ROXICODONE) 5 MG immediate release tablet Take 1 tablet (5 mg total) by mouth every 6 (six) hours as needed  for moderate pain (pain score 4-6).   rosuvastatin (CRESTOR) 5 MG tablet Take 1 tablet (5 mg total) by mouth daily.   RYBELSUS 7 MG TABS TAKE 1 TABLET BY MOUTH DAILY   No facility-administered encounter medications on file as of 04/11/2022.    Allergies (verified) Lisinopril   History: Past Medical History:  Diagnosis Date   Arthritis    knee , ankle and back   Bronchitis    Cataract    Diabetes mellitus without complication ([place]) 3/97/6734   Hyperlipidemia    Hypertension    Hypothyroidism, adult 02/10/2019   Neuralgia, post-herpetic 11/13/2016   Obesity (BMI 30.0-34.9) 02/10/2019   Thyroid disease    Vitamin D deficiency disease 02/10/2019   Past Surgical History:  Procedure Laterality Date   ABDOMINAL HYSTERECTOMY     fibroid   ANTERIOR HIP REVISION Right 10/24/2021   Procedure: RIGHT ANTERIOR HIP REVISION;  Surgeon: Mcarthur Rossetti, MD;  Location: [facility];  Service: Orthopedics;  Laterality: Right;   BREAST BIOPSY Right 07/31/2017   Procedure: EXCISIONAL BREAST BIOPSY WITH NEEDLE LOCALIZATION;  Surgeon: Virl Cagey, MD;  Location: [facility];  Service: General;  Laterality: Right;   BREAST SURGERY Right    COLONOSCOPY  09/13/2011   Procedure: COLONOSCOPY;  Surgeon: Rogene [place], MD;  Location[facility];  Service: Endoscopy;  Laterality: N/A;  1030   COLONOSCOPY N/A 07/31/2018   Procedure: COLONOSCOPY;  Surgeon: Rogene [place], MD;  Location[facility];  Service: Endoscopy;  Laterality: N/A;   POLYPECTOMY  07/31/2018   Procedure: POLYPECTOMY;  Surgeon: Rogene [place], MD;  Location[facility];  Service: Endoscopy;;   TOTAL HIP ARTHROPLASTY Right 12/13/2017   Procedure: RIGHT TOTAL HIP ARTHROPLASTY ANTERIOR APPROACH;  Surgeon: Mcarthur Rossetti, MD;  Location: [facility];  Service: Orthopedics;  Laterality: Right;   Family History  Problem Relation Age of Onset   Stroke Mother    Diabetes Mother    Alcohol abuse Father    Early death Father     Early death Sister        pneumonia   Cancer Brother    Alzheimer's disease Sister    COPD Brother    Colon cancer Neg Hx    Social History   Socioeconomic History   Marital status: Single    Spouse name: Not on file   Number of children: 3   Years of education: 10   Highest education level: Not on file  Occupational History   Occupation: disability  Tobacco Use   Smoking status: Former    Packs/day: 0.50    Years: 3.00    Total pack years: 1.50    Types: Cigarettes    Quit date: 1981    Years since quitting: 42.9   Smokeless tobacco: Never  Vaping Use   Vaping Use: Never used  Substance and Sexual Activity   Alcohol use: No   Drug use: Yes    Types: Marijuana   Sexual activity: Never    Birth control/protection: Surgical  Other Topics Concern   Not on file  Social History Narrative      Comments: Divorced x 3. Single. Has 3 children (2 boys and 1 girl), 9 grandchildren, 1 great-grandchild. Disability from arthritis in back. 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      Social Determinants of Health   Financial Resource Strain: Low Risk  (03/22/2021)   Overall Financial Resource Strain (CARDIA)    Difficulty of Paying Living Expenses: Not hard at all  Food Insecurity: No Food Insecurity (03/22/2021)   Hunger Vital Sign    Worried About Running Out of Food in the Last Year: Never true    Ran Out of Food in the Last Year: Never true  Transportation Needs: No Transportation Needs (03/22/2021)   PRAPARE - Hydrologist (Medical): No    Lack of Transportation (Non-Medical): No  Physical Activity: Insufficiently Active (03/22/2021)   Exercise Vital Sign    Days of Exercise per Week: 5 days    Minutes of Exercise per Session: 20 min  Stress: No Stress Concern Present (03/22/2021)   [place]    Feeling of Stress : Not at all   Social Connections: Moderately Isolated (03/22/2021)   Social Connection and Isolation Panel [NHANES]    Frequency of Communication with Friends and Family: More than three times a week    Frequency of Social Gatherings with Friends and Family: More than three times a week    Attends Religious Services: More than 4 times per year    Active Member of [employer] or Organizations: No    Attends Archivist Meetings: Never    Marital Status: Divorced    Tobacco Counseling Counseling given: Not Answered   Clinical Intake:  Diabetic?yes    Nutrition Risk Assessment:  Has the patient had any N/V/D within the last 2 months?  No  Does the patient have any non-healing wounds?  No  Has the patient had any unintentional weight loss or weight gain?  No   Diabetes:  Is the patient diabetic?  Yes  If diabetic, was a CBG obtained today?  No  Did the patient bring in their glucometer from home?  No  How often do you monitor your CBG's? 0.   Financial Strains and Diabetes Management:  Are you having any financial strains with the device, your supplies or your medication? No .  Does the patient want to be seen by Chronic Care Management for management of their diabetes?  No  Would the patient like to be referred to a Nutritionist or for Diabetic Management?  No   Diabetic Exams:  Diabetic Eye Exam: Completed 05/18/21 Diabetic Foot Exam: Completed 09/20/21       Activities of Daily Living    10/18/2021   10:16 AM 10/18/2021   10:12 AM  In your present state of health, do you have any difficulty performing the following activities:  Hearing?  0  Vision?  0  Difficulty concentrating or making decisions?  0  Walking or climbing stairs?  1  Dressing or bathing?  0  Doing errands, shopping? 0     Patient Care Team: Lindell Spar, MD as PCP - General (Internal Medicine)  Indicate any recent Medical Services you may have received from other than [facility]  providers in the past year (date may be approximate).     Assessment:   This is a routine wellness examination for [place].  Hearing/Vision screen No results found.  Dietary issues and exercise activities discussed:     Goals Addressed   None    Depression Screen    01/09/2022    2:33 PM 09/20/2021    4:16 PM 05/12/2021   10:55 AM 03/22/2021    2:56 PM 03/22/2021    2:54 PM 02/28/2021    3:44 PM 06/22/2020    4:26 PM  PHQ 2/9 Scores  PHQ - 2 Score 0 0 0 0 0 0 0  PHQ- 9 Score       0    Fall Risk    01/09/2022    2:33 PM 09/20/2021    4:16 PM 05/12/2021   10:55 AM 03/22/2021    2:56 PM 02/28/2021    3:43 PM  Fall Risk   Falls in the past year? 0 1 0 0 0  Number falls in past yr: 0 0 0 0 0  Injury with Fall? 0 0 0 0 0  Risk for fall due to : No Fall Risks History of fall(s);Impaired mobility No Fall Risks No Fall Risks No Fall Risks  Follow up Falls evaluation completed Falls evaluation completed;Falls prevention discussed Falls evaluation completed Falls evaluation completed Falls evaluation completed;Education provided;Falls prevention discussed    FALL RISK PREVENTION PERTAINING TO THE HOME:  Any stairs in or around the home? Yes  If so, are there any without handrails? No  Home free of loose throw rugs in walkways, pet beds, electrical cords, etc? Yes  Adequate lighting in your home to reduce risk of falls? Yes   ASSISTIVE DEVICES UTILIZED TO PREVENT FALLS:  Life alert? No  Use of a cane, walker or w/c? No  Grab bars in the bathroom? Yes  Shower chair or bench in shower? No  Elevated toilet  seat or a handicapped toilet? No   TIMED UP AND GO:  Was the test performed? No .  Length of time to ambulate 10 feet:  sec.     Cognitive Function:    03/22/2021    2:57 PM  MMSE - Mini Mental State Exam  Not completed: Unable to complete        03/22/2021    2:57 PM 04/10/2017   10:24 AM  6CIT Screen  What Year? 0 points 0 points  What month? 0 points 0  points  What time? 0 points 0 points  Count back from 20 0 points 0 points  Months in reverse 2 points 0 points  Repeat phrase 0 points 2 points  Total Score 2 points 2 points    Immunizations Immunization History  Administered Date(s) Administered   Fluad Quad(high Dose 65+) 03/14/2022   Influenza,inj,Quad PF,6+ Mos 03/12/2017, 03/10/2019, 04/11/2020, 02/28/2021   Moderna Sars-Covid-2 Vaccination 11/18/2019, 11/19/2019   Pneumococcal Conjugate-13 03/10/2019   Pneumococcal Polysaccharide-23 04/11/2020   Td 05/21/2009    TDAP status: Up to date  Flu Vaccine status: Up to date  Pneumococcal vaccine status: Up to date  Covid-19 vaccine status: Information provided on how to obtain vaccines.   Qualifies for Shingles Vaccine? Yes   Zostavax completed No   Shingrix Completed?: No.    Education has been provided regarding the importance of this vaccine. Patient has been advised to call insurance company to determine out of pocket expense if they have not yet received this vaccine. Advised may also receive vaccine at local pharmacy or Health Dept. Verbalized acceptance and understanding.  Screening Tests Health Maintenance  Topic Date Due   Zoster Vaccines- Shingrix (1 of 2) Never done   COVID-19 Vaccine (3 - Moderna risk series) 12/17/2019   Diabetic kidney evaluation - Urine ACR  09/07/2020   DEXA SCAN  Never done   Medicare Annual Wellness (AWV)  03/22/2022   HEMOGLOBIN A1C  04/19/2022   OPHTHALMOLOGY EXAM  05/18/2022   FOOT EXAM  09/21/2022   Diabetic kidney evaluation - GFR measurement  10/26/2022   COLONOSCOPY (Pts 45-22yr Insurance coverage will need to be confirmed)  07/31/2023   MAMMOGRAM  08/15/2023   Pneumonia Vaccine 65 Years old (3 - PPSV23 or PCV20) 04/11/2025   INFLUENZA VACCINE  Completed   Hepatitis C Screening  Completed   HIV Screening  Completed   HPV VACCINES  Aged Out   PAP SMEAR-Modifier  Discontinued    Health Maintenance  Health Maintenance  Due  Topic Date Due   Zoster Vaccines- Shingrix (1 of 2) Never done   COVID-19 Vaccine (3 - Moderna risk series) 12/17/2019   Diabetic kidney evaluation - Urine ACR  09/07/2020   DEXA SCAN  Never done   Medicare Annual Wellness (AWV)  03/22/2022    Colorectal cancer screening: Type of screening: Colonoscopy. Completed 07/31/18. Repeat every 10 years  Mammogram status: Completed 08/14/21. Repeat every year    Lung Cancer Screening: (Low Dose CT Chest recommended if Age 519-80years, 30 pack-year currently smoking OR have quit w/in 15years.) does not qualify.   Lung Cancer Screening Referral:   Additional Screening:  Hepatitis C Screening: does not qualify; Completed 02/10/19  Vision Screening: Recommended annual ophthalmology exams for early detection of glaucoma and other disorders of the eye. Is the patient up to date with their annual eye exam?  Yes  Who is the provider or what is the name of the office in which the  patient attends annual eye exams? My eye doctor If pt is not established with a provider, would they like to be referred to a provider to establish care? No .   Dental Screening: Recommended annual dental exams for proper oral hygiene  Community Resource Referral / Chronic Care Management: CRR required this visit?  No   CCM required this visit?  No      Plan:     I have personally reviewed and noted the following in the patient's chart:   Medical and social history Use of alcohol, tobacco or illicit drugs  Current medications and supplements including opioid prescriptions. Patient is not currently taking opioid prescriptions. Functional ability and status Nutritional status Physical activity Advanced directives List of other physicians Hospitalizations, surgeries, and ER visits in previous 12 months Vitals Screenings to include cognitive, depression, and falls Referrals and appointments  In addition, I have reviewed and discussed with patient certain  preventive protocols, quality metrics, and best practice recommendations. A written personalized care plan for preventive services as well as general preventive health recommendations were provided to patient.     Jill Side, [place]   04/11/2022   Nurse Notes:

## 2022-04-11 NOTE — Patient Instructions (Signed)
  Jeanette Compton , Thank you for taking time to come for your Medicare Wellness Visit. I appreciate your ongoing commitment to your health goals. Please review the following plan we discussed and let me know if I can assist you in the future.   These are the goals we discussed:  Goals      Blood Pressure < 140/90     HEMOGLOBIN A1C < 7.0     Patient Stated     Help take care of great grand kids.     Patient Stated     Patient states that she currently does not have any goals.        This is a list of the screening recommended for you and due dates:  Health Maintenance  Topic Date Due   Zoster (Shingles) Vaccine (1 of 2) Never done   COVID-19 Vaccine (3 - Moderna risk series) 12/17/2019   Yearly kidney health urinalysis for diabetes  09/07/2020   DEXA scan (bone density measurement)  Never done   Hemoglobin A1C  04/19/2022   Eye exam for diabetics  05/18/2022   Complete foot exam   09/21/2022   Yearly kidney function blood test for diabetes  10/26/2022   Medicare Annual Wellness Visit  04/12/2023   Colon Cancer Screening  07/31/2023   Mammogram  08/15/2023   Pneumonia Vaccine (3 - PPSV23 or PCV20) 04/11/2025   Flu Shot  Completed   Hepatitis C Screening: USPSTF Recommendation to screen - Ages 18-79 yo.  Completed   HIV Screening  Completed   HPV Vaccine  Aged Out   Pap Smear  Discontinued

## 2022-04-17 DIAGNOSIS — J449 Chronic obstructive pulmonary disease, unspecified: Secondary | ICD-10-CM | POA: Diagnosis not present

## 2022-04-27 ENCOUNTER — Other Ambulatory Visit: Payer: Self-pay | Admitting: Internal Medicine

## 2022-04-27 DIAGNOSIS — E039 Hypothyroidism, unspecified: Secondary | ICD-10-CM

## 2022-04-27 DIAGNOSIS — E782 Mixed hyperlipidemia: Secondary | ICD-10-CM

## 2022-04-27 MED ORDER — ROSUVASTATIN CALCIUM 5 MG PO TABS: 5.0000 mg | ORAL_TABLET | Freq: Every day | ORAL | 1 refills | Status: DC

## 2022-04-27 NOTE — Progress Notes (Signed)
[facility] Quality Team Note  Name: MIRZA KIDNEY Date of Birth: Sep 22, 1955 MRN: [id number] Date: 04/27/2022  [facility] Quality Team has reviewed this patient's chart, please see recommendations below:  [facility] Quality Other; (KED: Kidney Health Evaluation Gap- Patient needs Urine Albumin Creatinine Ratio Test completed for gap closure. EGFR has already been completed, Patient has upcoming appointment with [facility] 05/07/2022.

## 2022-05-15 DIAGNOSIS — E1169 Type 2 diabetes mellitus with other specified complication: Secondary | ICD-10-CM | POA: Diagnosis not present

## 2022-05-15 DIAGNOSIS — Z0001 Encounter for general adult medical examination with abnormal findings: Secondary | ICD-10-CM | POA: Diagnosis not present

## 2022-05-15 DIAGNOSIS — E782 Mixed hyperlipidemia: Secondary | ICD-10-CM | POA: Diagnosis not present

## 2022-05-15 DIAGNOSIS — E559 Vitamin D deficiency, unspecified: Secondary | ICD-10-CM | POA: Diagnosis not present

## 2022-05-16 LAB — CMP14+EGFR
ALT: 10 IU/L (ref 0–32)
AST: 16 IU/L (ref 0–40)
Albumin/Globulin Ratio: 1.3 (ref 1.2–2.2)
Albumin: 4.1 g/dL (ref 3.9–4.9)
Alkaline Phosphatase: 123 IU/L — ABNORMAL HIGH (ref 44–121)
BUN/Creatinine Ratio: 17 (ref 12–28)
BUN: 14 mg/dL (ref 8–27)
Bilirubin Total: <0.2 mg/dL (ref 0.0–1.2)
CO2: 24 mmol/L (ref 20–29)
Calcium: 9.5 mg/dL (ref 8.7–10.3)
Chloride: 103 mmol/L (ref 96–106)
Creatinine, Ser: 0.83 mg/dL (ref 0.57–1.00)
Globulin, Total: 3.2 g/dL (ref 1.5–4.5)
Glucose: 111 mg/dL — ABNORMAL HIGH (ref 70–99)
Potassium: 4.6 mmol/L (ref 3.5–5.2)
Sodium: 141 mmol/L (ref 134–144)
Total Protein: 7.3 g/dL (ref 6.0–8.5)
eGFR: 78 mL/min/{1.73_m2} (ref >59)

## 2022-05-16 LAB — CBC WITH DIFFERENTIAL/PLATELET
Basophils Absolute: 0.1 10*3/uL (ref 0.0–0.2)
Basos: 1 %
EOS (ABSOLUTE): 0.1 10*3/uL (ref 0.0–0.4)
Eos: 2 %
Hematocrit: 37.2 % (ref 34.0–46.6)
Hemoglobin: 11.7 g/dL (ref 11.1–15.9)
Immature Grans (Abs): 0 10*3/uL (ref 0.0–0.1)
Immature Granulocytes: 0 %
Lymphocytes Absolute: 2.6 10*3/uL (ref 0.7–3.1)
Lymphs: 38 %
MCH: 25.1 pg — ABNORMAL LOW (ref 26.6–33.0)
MCHC: 31.5 g/dL (ref 31.5–35.7)
MCV: 80 fL (ref 79–97)
Monocytes Absolute: 0.5 10*3/uL (ref 0.1–0.9)
Monocytes: 8 %
Neutrophils Absolute: 3.5 10*3/uL (ref 1.4–7.0)
Neutrophils: 51 %
Platelets: 322 10*3/uL (ref 150–450)
RBC: 4.66 x10E6/uL (ref 3.77–5.28)
RDW: 14.7 % (ref 11.7–15.4)
WBC: 6.8 10*3/uL (ref 3.4–10.8)

## 2022-05-16 LAB — TSH+FREE T4
Free T4: 0.94 ng/dL (ref 0.82–1.77)
TSH: 0.631 u[IU]/mL (ref 0.450–4.500)

## 2022-05-16 LAB — LIPID PANEL
Chol/HDL Ratio: 2.6 ratio (ref 0.0–4.4)
Cholesterol, Total: 166 mg/dL (ref 100–199)
HDL: 63 mg/dL (ref >39)
LDL Chol Calc (NIH): 87 mg/dL (ref 0–99)
Triglycerides: 87 mg/dL (ref 0–149)
VLDL Cholesterol Cal: 16 mg/dL (ref 5–40)

## 2022-05-16 LAB — VITAMIN D 25 HYDROXY (VIT D DEFICIENCY, FRACTURES): Vit D, 25-Hydroxy: 46 ng/mL (ref 30.0–100.0)

## 2022-05-16 LAB — HEMOGLOBIN A1C
Est. average glucose Bld gHb Est-mCnc: 140 mg/dL
Hgb A1c MFr Bld: 6.5 % — ABNORMAL HIGH (ref 4.8–5.6)

## 2022-05-17 ENCOUNTER — Encounter: Payer: Self-pay | Admitting: Internal Medicine

## 2022-05-17 ENCOUNTER — Ambulatory Visit (INDEPENDENT_AMBULATORY_CARE_PROVIDER_SITE_OTHER): Payer: Medicare Other | Admitting: Internal Medicine

## 2022-05-17 VITALS — BP 146/82 | HR 82 | Ht 66.0 in | Wt 191.8 lb

## 2022-05-17 DIAGNOSIS — I10 Essential (primary) hypertension: Secondary | ICD-10-CM | POA: Diagnosis not present

## 2022-05-17 DIAGNOSIS — E039 Hypothyroidism, unspecified: Secondary | ICD-10-CM

## 2022-05-17 DIAGNOSIS — J449 Chronic obstructive pulmonary disease, unspecified: Secondary | ICD-10-CM | POA: Diagnosis not present

## 2022-05-17 DIAGNOSIS — E1169 Type 2 diabetes mellitus with other specified complication: Secondary | ICD-10-CM | POA: Diagnosis not present

## 2022-05-17 DIAGNOSIS — E669 Obesity, unspecified: Secondary | ICD-10-CM

## 2022-05-17 DIAGNOSIS — E782 Mixed hyperlipidemia: Secondary | ICD-10-CM | POA: Diagnosis not present

## 2022-05-17 MED ORDER — LOSARTAN POTASSIUM-HCTZ 100-12.5 MG PO TABS: 1.0000 | ORAL_TABLET | Freq: Every day | ORAL | 3 refills | Status: DC

## 2022-05-17 MED ORDER — AMLODIPINE BESYLATE 5 MG PO TABS: 5.0000 mg | ORAL_TABLET | Freq: Every day | ORAL | 3 refills | Status: DC

## 2022-05-17 NOTE — Patient Instructions (Signed)
Please start taking Amlodipine 5 mg once daily.  Please continue taking other medications as prescribed.  Please continue to follow low salt diet and ambulate as tolerated.

## 2022-05-17 NOTE — Assessment & Plan Note (Addendum)
BP Readings from Last 1 Encounters:  05/17/22 (!) 160/82   Uncontrolled with Losartan-HCTZ 100-12.5 mg QD Added Amlodipine 5 mg QD Counseled for compliance with the medications Advised DASH diet and moderate exercise/walking, at least 150 mins/week

## 2022-05-17 NOTE — Assessment & Plan Note (Addendum)
Lab Results  Component Value Date   HGBA1C 6.5 (H) 05/15/2022   Well-controlled On Rybelsus 7 mg QD Advised to follow diabetic diet On ARB and statin F/u HbA1C, CMP and lipid panel Diabetic eye exam: Advised to follow up with Ophthalmology for diabetic eye exam

## 2022-05-18 NOTE — Assessment & Plan Note (Signed)
Check lipid profile On Crestor 

## 2022-05-18 NOTE — Assessment & Plan Note (Signed)
Lab Results  Component Value Date   TSH 0.631 05/15/2022   On levothyroxine 75 mcg daily Check TSH and free T4

## 2022-05-18 NOTE — Progress Notes (Signed)
Established Patient Office Visit  Subjective:  Patient ID: Jeanette Compton, female    DOB: [birth year]  Age: 66 y.o. MRN: [id number]  CC:  Chief Complaint  Patient presents with   Follow-up    For hypertension     HPI Jeanette Compton is a 66 y.o. female with past medical history of HTN, type II DM, hypothyroidism, OA of hip and knee, vitiligo and obesity who presents for f/u of her chronic medical conditions.  HTN: Her BP is elevated today.  She has been taking losartan HCTZ 100-12.5 mg daily.  She denies any headache, dizziness, chest pain, dyspnea or palpitations.  Type II DM: She has ben taking Rybelsus for it. Her HbA1C was 6.3 in 05/23. She denies any fatigue, polyuria, polydipsia or polyphagia. She has lost about 14 lbs since starting Rybelsus.   Past Medical History:  Diagnosis Date   Arthritis    knee , ankle and back   Bronchitis    Cataract    Diabetes mellitus without complication ([facility]) 3/55/9741   Hyperlipidemia    Hypertension    Hypothyroidism, adult 02/10/2019   Neuralgia, post-herpetic 11/13/2016   Obesity (BMI 30.0-34.9) 02/10/2019   Thyroid disease    Vitamin D deficiency disease 02/10/2019    Past Surgical History:  Procedure Laterality Date   ABDOMINAL HYSTERECTOMY     fibroid   ANTERIOR HIP REVISION Right 10/24/2021   Procedure: RIGHT ANTERIOR HIP REVISION;  Surgeon: Mcarthur Rossetti, MD;  Location: [facility];  Service: Orthopedics;  Laterality: Right;   BREAST BIOPSY Right 07/31/2017   Procedure: EXCISIONAL BREAST BIOPSY WITH NEEDLE LOCALIZATION;  Surgeon: Virl Cagey, MD;  Location: [facility];  Service: General;  Laterality: Right;   BREAST SURGERY Right    COLONOSCOPY  09/13/2011   Procedure: COLONOSCOPY;  Surgeon: Rogene Houston, MD;  [facility];  Service: Endoscopy;  Laterality: N/A;  1030   COLONOSCOPY N/A 07/31/2018   Procedure: COLONOSCOPY;  Surgeon: Rogene Houston, MD;  [facility];  Service: Endoscopy;   Laterality: N/A;   POLYPECTOMY  07/31/2018   Procedure: POLYPECTOMY;  Surgeon: Rogene Houston, MD;  [facility];  Service: Endoscopy;;   TOTAL HIP ARTHROPLASTY Right 12/13/2017   Procedure: RIGHT TOTAL HIP ARTHROPLASTY ANTERIOR APPROACH;  Surgeon: Mcarthur Rossetti, MD;  Location: [facility];  Service: Orthopedics;  Laterality: Right;    Family History  Problem Relation Age of Onset   Stroke Mother    Diabetes Mother    Alcohol abuse Father    Early death Father    Early death Sister        pneumonia   Cancer Brother    Alzheimer's disease Sister    COPD Brother    Colon cancer Neg Hx     Social History   Socioeconomic History   Marital status: Single    Spouse name: Not on file   Number of children: 3   Years of education: 10   Highest education level: Not on file  Occupational History   Occupation: disability  Tobacco Use   Smoking status: Former    Packs/day: 0.50    Years: 3.00    Total pack years: 1.50    Types: Cigarettes    Quit date: 1981    Years since quitting: 43.0   Smokeless tobacco: Never  Vaping Use   Vaping Use: Never used  Substance and Sexual Activity   Alcohol use: No   Drug use:  Yes    Types: Marijuana   Sexual activity: Never    Birth control/protection: Surgical  Other Topics Concern   Not on file  Social History Narrative      Comments: Divorced x 3. Single. Has 3 children (2 boys and 1 girl), 9 grandchildren, 1 great-grandchild. Disability from arthritis in back. 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      Social Determinants of Health   Financial Resource Strain: Low Risk  (04/11/2022)   Overall Financial Resource Strain (CARDIA)    Difficulty of Paying Living Expenses: Not hard at all  Food Insecurity: No Food Insecurity (04/11/2022)   Hunger Vital Sign    Worried About Running Out of Food in the Last Year: Never true    Ran Out of Food in the Last Year: Never  true  Transportation Needs: No Transportation Needs (04/11/2022)   PRAPARE - Hydrologist (Medical): No    Lack of Transportation (Non-Medical): No  Physical Activity: Insufficiently Active (03/22/2021)   Exercise Vital Sign    Days of Exercise per Week: 5 days    Minutes of Exercise per Session: 20 min  Stress: No Stress Concern Present (04/11/2022)   [place]    Feeling of Stress : Not at all  Social Connections: Moderately Isolated (04/11/2022)   Social Connection and Isolation Panel [NHANES]    Frequency of Communication with Friends and Family: More than three times a week    Frequency of Social Gatherings with Friends and Family: Twice a week    Attends Religious Services: More than 4 times per year    Active Member of [employer] or Organizations: No    Attends Archivist Meetings: Never    Marital Status: Divorced  Human resources officer Violence: Not At Risk (03/22/2021)   Humiliation, Afraid, Rape, and Kick questionnaire    Fear of Current or Ex-Partner: No    Emotionally Abused: No    Physically Abused: No    Sexually Abused: No    Outpatient Medications Prior to Visit  Medication Sig Dispense Refill   Cholecalciferol (VITAMIN D3) 125 MCG (5000 UT) CAPS Take 2 capsules by mouth daily.      levothyroxine (SYNTHROID) 75 MCG tablet TAKE 1 TABLET BY MOUTH DAILY 100 tablet 2   Multiple Vitamins-Minerals (HAIR SKIN AND NAILS FORMULA) TABS Take 2 tablets by mouth daily.     rosuvastatin (CRESTOR) 5 MG tablet Take 1 tablet (5 mg total) by mouth daily. 90 tablet 1   RYBELSUS 7 MG TABS TAKE 1 TABLET BY MOUTH DAILY 90 tablet 3   doxycycline (VIBRAMYCIN) 100 MG capsule Take 1 capsule (100 mg total) by mouth 2 (two) times daily. (Patient not taking: Reported on 04/11/2022) 30 capsule 0   losartan-hydrochlorothiazide (HYZAAR) 100-12.5 MG tablet TAKE 1 TABLET BY MOUTH DAILY 90 tablet 3    methocarbamol (ROBAXIN) 500 MG tablet Take 1 tablet (500 mg total) by mouth 4 (four) times daily. (Patient not taking: Reported on 01/09/2022) 40 tablet 1   oxyCODONE (OXY IR/ROXICODONE) 5 MG immediate release tablet Take 1 tablet (5 mg total) by mouth every 6 (six) hours as needed for moderate pain (pain score 4-6). (Patient not taking: Reported on 04/11/2022) 30 tablet 0   No facility-administered medications prior to visit.    Allergies  Allergen Reactions   Lisinopril Swelling and Other (See Comments)    Lips  swelled    ROS Review of Systems  Constitutional:  Negative for chills and fever.  HENT:  Negative for congestion, sinus pressure, sinus pain and sore throat.   Eyes:  Negative for pain and discharge.  Respiratory:  Negative for cough and shortness of breath.   Cardiovascular:  Negative for chest pain and palpitations.  Gastrointestinal:  Negative for abdominal pain, constipation, diarrhea, nausea and vomiting.  Endocrine: Negative for polydipsia and polyuria.  Genitourinary:  Negative for dysuria and hematuria.  Musculoskeletal:  Positive for arthralgias (R hip and knee). Negative for neck pain and neck stiffness.  Skin:  Negative for rash.  Neurological:  Negative for dizziness and weakness.  Psychiatric/Behavioral:  Negative for agitation and behavioral problems.       Objective:    Physical Exam Vitals reviewed.  Constitutional:      General: She is not in acute distress.    Appearance: She is not diaphoretic.  HENT:     Head: Normocephalic and atraumatic.     Nose: Nose normal. No congestion.     Mouth/Throat:     Mouth: Mucous membranes are moist.     Pharynx: No posterior oropharyngeal erythema.  Eyes:     General: No scleral icterus.    Extraocular Movements: Extraocular movements intact.  Cardiovascular:     Rate and Rhythm: Normal rate and regular rhythm.     Pulses: Normal pulses.     Heart sounds: Normal heart sounds. No murmur heard. Pulmonary:      Breath sounds: Normal breath sounds. No wheezing or rales.  Abdominal:     Palpations: Abdomen is soft.     Tenderness: There is no abdominal tenderness.  Musculoskeletal:     Cervical back: Neck supple. No tenderness.     Right lower leg: No edema.     Left lower leg: No edema.  Skin:    General: Skin is warm.     Findings: No rash.  Neurological:     General: No focal deficit present.     Mental Status: She is alert and oriented to person, place, and time.     Sensory: No sensory deficit.     Motor: No weakness.  Psychiatric:        Mood and Affect: Mood normal.        Behavior: Behavior normal.     BP (!) 146/82 (BP Location: Right Arm, Cuff Size: Normal)   Pulse 82   Ht _0  (1.676 m)   Wt 191 lb 12.8 oz (87 kg)   SpO2 97%   BMI 30.96 kg/m  Wt Readings from Last 3 Encounters:  05/17/22 191 lb 12.8 oz (87 kg)  01/09/22 183 lb 6.4 oz (83.2 kg)  10/24/21 192 lb (87.1 kg)    Lab Results  Component Value Date   TSH 0.631 05/15/2022   Lab Results  Component Value Date   WBC 6.8 05/15/2022   HGB 11.7 05/15/2022   HCT 37.2 05/15/2022   MCV 80 05/15/2022   PLT 322 05/15/2022   Lab Results  Component Value Date   NA 141 05/15/2022   K 4.6 05/15/2022   CO2 24 05/15/2022   GLUCOSE 111 (H) 05/15/2022   BUN 14 05/15/2022   CREATININE 0.83 05/15/2022   BILITOT <0.2 05/15/2022   ALKPHOS 123 (H) 05/15/2022   AST 16 05/15/2022   ALT 10 05/15/2022   PROT 7.3 05/15/2022   ALBUMIN 4.1 05/15/2022   CALCIUM 9.5 05/15/2022   ANIONGAP 11 10/25/2021  EGFR 78 05/15/2022   Lab Results  Component Value Date   CHOL 166 05/15/2022   Lab Results  Component Value Date   HDL 63 05/15/2022   Lab Results  Component Value Date   LDLCALC 87 05/15/2022   Lab Results  Component Value Date   TRIG 87 05/15/2022   Lab Results  Component Value Date   CHOLHDL 2.6 05/15/2022   Lab Results  Component Value Date   HGBA1C 6.5 (H) 05/15/2022      Assessment & Plan:    Problem List Items Addressed This Visit       Cardiovascular and Mediastinum   Essential hypertension - Primary    BP Readings from Last 1 Encounters:  05/17/22 (!) 160/82  Uncontrolled with Losartan-HCTZ 100-12.5 mg QD Added Amlodipine 5 mg QD Counseled for compliance with the medications Advised DASH diet and moderate exercise/walking, at least 150 mins/week      Relevant Medications   losartan-hydrochlorothiazide (HYZAAR) 100-12.5 MG tablet   amLODipine (NORVASC) 5 MG tablet     Endocrine   Hypothyroidism, adult    Lab Results  Component Value Date   TSH 0.631 05/15/2022  On levothyroxine 75 mcg daily Check TSH and free T4      Diabetes mellitus type 2 in obese ([facility])    Lab Results  Component Value Date   HGBA1C 6.5 (H) 05/15/2022  Well-controlled On Rybelsus 7 mg QD Advised to follow diabetic diet On ARB and statin F/u HbA1C, CMP and lipid panel Diabetic eye exam: Advised to follow up with Ophthalmology for diabetic eye exam      Relevant Medications   losartan-hydrochlorothiazide (HYZAAR) 100-12.5 MG tablet   Other Relevant Orders   Urine Microalbumin w/creat. ratio     Other   HLD (hyperlipidemia)    Check lipid profile On Crestor      Relevant Medications   losartan-hydrochlorothiazide (HYZAAR) 100-12.5 MG tablet   amLODipine (NORVASC) 5 MG tablet   Obesity (BMI 30.0-34.9)    Diet modification advised On GLP-1 agonist for DM       Meds ordered this encounter  Medications   losartan-hydrochlorothiazide (HYZAAR) 100-12.5 MG tablet    Sig: Take 1 tablet by mouth daily.    Dispense:  90 tablet    Refill:  3    Requesting 1 year supply   amLODipine (NORVASC) 5 MG tablet    Sig: Take 1 tablet (5 mg total) by mouth daily.    Dispense:  90 tablet    Refill:  3    Follow-up: Return in about 3 months (around 08/16/2022) for HTN.    Lindell Spar, MD

## 2022-05-18 NOTE — Assessment & Plan Note (Addendum)
Diet modification advised On GLP-1 agonist for DM

## 2022-05-19 LAB — MICROALBUMIN / CREATININE URINE RATIO
Creatinine, Urine: 28.3 mg/dL
Microalb/Creat Ratio: <11 mg/g creat (ref 0–29)
Microalbumin, Urine: <3 ug/mL

## 2022-06-17 DIAGNOSIS — J449 Chronic obstructive pulmonary disease, unspecified: Secondary | ICD-10-CM | POA: Diagnosis not present

## 2022-06-22 DIAGNOSIS — Z635 Disruption of family by separation and divorce: Secondary | ICD-10-CM | POA: Diagnosis not present

## 2022-06-22 DIAGNOSIS — H5789 Other specified disorders of eye and adnexa: Secondary | ICD-10-CM | POA: Diagnosis not present

## 2022-06-22 DIAGNOSIS — D803 Selective deficiency of immunoglobulin G [IgG] subclasses: Secondary | ICD-10-CM | POA: Diagnosis not present

## 2022-06-22 DIAGNOSIS — J4489 Other specified chronic obstructive pulmonary disease: Secondary | ICD-10-CM | POA: Diagnosis not present

## 2022-06-28 ENCOUNTER — Other Ambulatory Visit: Payer: Self-pay | Admitting: Internal Medicine

## 2022-06-28 DIAGNOSIS — E669 Obesity, unspecified: Secondary | ICD-10-CM

## 2022-07-05 ENCOUNTER — Other Ambulatory Visit (HOSPITAL_COMMUNITY): Payer: Self-pay | Admitting: Internal Medicine

## 2022-07-05 DIAGNOSIS — Z1231 Encounter for screening mammogram for malignant neoplasm of breast: Secondary | ICD-10-CM

## 2022-07-14 ENCOUNTER — Other Ambulatory Visit: Payer: Self-pay | Admitting: Internal Medicine

## 2022-07-14 DIAGNOSIS — I10 Essential (primary) hypertension: Secondary | ICD-10-CM

## 2022-07-18 DIAGNOSIS — J449 Chronic obstructive pulmonary disease, unspecified: Secondary | ICD-10-CM | POA: Diagnosis not present

## 2022-08-16 DIAGNOSIS — J449 Chronic obstructive pulmonary disease, unspecified: Secondary | ICD-10-CM | POA: Diagnosis not present

## 2022-08-20 ENCOUNTER — Encounter (HOSPITAL_COMMUNITY): Payer: Self-pay

## 2022-08-20 ENCOUNTER — Ambulatory Visit: Payer: Medicare Other | Admitting: Internal Medicine

## 2022-08-20 ENCOUNTER — Ambulatory Visit (HOSPITAL_COMMUNITY)
Admission: RE | Admit: 2022-08-20 | Discharge: 2022-08-20 | Disposition: A | Discharge status: Home / Self Care | Payer: 59 | Source: Ambulatory Visit | Attending: Internal Medicine | Admitting: Internal Medicine

## 2022-08-20 DIAGNOSIS — Z1231 Encounter for screening mammogram for malignant neoplasm of breast: Secondary | ICD-10-CM | POA: Diagnosis not present

## 2022-08-21 ENCOUNTER — Encounter: Payer: Self-pay | Admitting: Internal Medicine

## 2022-08-21 ENCOUNTER — Ambulatory Visit (INDEPENDENT_AMBULATORY_CARE_PROVIDER_SITE_OTHER): Payer: 59 | Admitting: Internal Medicine

## 2022-08-21 VITALS — BP 136/82 | HR 87 | Ht 66.0 in | Wt 198.2 lb

## 2022-08-21 DIAGNOSIS — E669 Obesity, unspecified: Secondary | ICD-10-CM | POA: Diagnosis not present

## 2022-08-21 DIAGNOSIS — I10 Essential (primary) hypertension: Secondary | ICD-10-CM

## 2022-08-21 DIAGNOSIS — E039 Hypothyroidism, unspecified: Secondary | ICD-10-CM

## 2022-08-21 DIAGNOSIS — E1169 Type 2 diabetes mellitus with other specified complication: Secondary | ICD-10-CM

## 2022-08-21 DIAGNOSIS — Z13820 Encounter for screening for osteoporosis: Secondary | ICD-10-CM

## 2022-08-21 DIAGNOSIS — E782 Mixed hyperlipidemia: Secondary | ICD-10-CM

## 2022-08-21 DIAGNOSIS — E66811 Obesity, class 1: Secondary | ICD-10-CM

## 2022-08-21 LAB — POCT GLYCOSYLATED HEMOGLOBIN (HGB A1C): HbA1c, POC (controlled diabetic range): 6.4 % (ref 0.0–7.0)

## 2022-08-21 NOTE — Patient Instructions (Addendum)
Please continue to take medications as prescribed.  Please continue to follow low carb diet and perform moderate exercise/walking at least 150 mins/week.  Please get fasting blood tests done before the next visit.  Please consider getting Shingrix and Tdap vaccine at local pharmacy.

## 2022-08-21 NOTE — Assessment & Plan Note (Signed)
Check lipid profile On Crestor 

## 2022-08-21 NOTE — Assessment & Plan Note (Signed)
Diet modification advised On GLP-1 agonist for DM 

## 2022-08-21 NOTE — Progress Notes (Signed)
Established Patient Office Visit  Subjective:  Patient ID: Jeanette Compton, female    DOB: Sep 20, 1955  Age: 67 y.o. MRN: [id number]  CC:  Chief Complaint  Patient presents with   Hypertension    Three month follow up    HPI Jeanette Compton is a 67 y.o. female with past medical history of HTN, type II DM, hypothyroidism, OA of hip and knee, vitiligo and obesity who presents for f/u of her chronic medical conditions.  HTN: Her BP is wnl today.  She has been taking losartan HCTZ 100-12.5 mg and amlodipine 5 mg daily.  She denies any headache, dizziness, chest pain, dyspnea or palpitations.  Type II DM: She has ben taking Rybelsus for it. Her HbA1C was 6.4 today. She denies any fatigue, polyuria, polydipsia or polyphagia. She had lost about 14 lbs since starting Rybelsus, but has gained 7 lbs since the last visit.  She admits that she needs to improve her diet.     Past Medical History:  Diagnosis Date   Arthritis    knee , ankle and back   Bronchitis    Cataract    Diabetes mellitus without complication [id number]   Hyperlipidemia    Hypertension    Hypothyroidism, adult 02/10/2019   Neuralgia, post-herpetic 11/13/2016   Obesity (BMI 30.0-34.9) 02/10/2019   Thyroid disease    Vitamin D deficiency disease 02/10/2019    Past Surgical History:  Procedure Laterality Date   ABDOMINAL HYSTERECTOMY     fibroid   ANTERIOR HIP REVISION Right 10/24/2021   Procedure: RIGHT ANTERIOR HIP REVISION;  Surgeon: Mcarthur Rossetti, MD;  Location: [facility];  Service: Orthopedics;  Laterality: Right;   BREAST BIOPSY Right 07/31/2017   lumpectomy for papilloma   BREAST SURGERY Right    COLONOSCOPY  09/13/2011   Procedure: COLONOSCOPY;  Surgeon: Rogene Houston, MD;  [facility];  Service: Endoscopy;  Laterality: N/A;  1030   COLONOSCOPY N/A 07/31/2018   Procedure: COLONOSCOPY;  Surgeon: Rogene Houston, MD;  [facility];  Service: Endoscopy;  Laterality: N/A;    POLYPECTOMY  07/31/2018   Procedure: POLYPECTOMY;  Surgeon: Rogene Houston, MD;  [facility];  Service: Endoscopy;;   TOTAL HIP ARTHROPLASTY Right 12/13/2017   Procedure: RIGHT TOTAL HIP ARTHROPLASTY ANTERIOR APPROACH;  Surgeon: Mcarthur Rossetti, MD;  Location[facility];  Service: Orthopedics;  Laterality: Right;    Family History  Problem Relation Age of Onset   Stroke Mother    Diabetes Mother    Alcohol abuse Father    Early death Father    Early death Sister        pneumonia   Cancer Brother    Alzheimer's disease Sister    COPD Brother    Colon cancer Neg Hx     Social History   Socioeconomic History   Marital status: Single    Spouse name: Not on file   Number of children: 3   Years of education: 10   Highest education level: Not on file  Occupational History   Occupation: disability  Tobacco Use   Smoking status: Former    Packs/day: 0.50    Years: 3.00    Additional pack years: 0.00    Total pack years: 1.50    Types: Cigarettes    Quit date: 1981    Years since quitting: 43.2   Smokeless tobacco: Never  Vaping Use   Vaping Use: Never used  Substance and  Sexual Activity   Alcohol use: No   Drug use: Yes    Types: Marijuana   Sexual activity: Never    Birth control/protection: Surgical  Other Topics Concern   Not on file  Social History Narrative      Comments: Divorced x 3. Single. Has 3 children (2 boys and 1 girl), 9 grandchildren, 1 great-grandchild. Disability from arthritis in back. 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      Social Determinants of Health   Financial Resource Strain: Low Risk  (04/11/2022)   Overall Financial Resource Strain (CARDIA)    Difficulty of Paying Living Expenses: Not hard at all  Food Insecurity: No Food Insecurity (04/11/2022)   Hunger Vital Sign    Worried About Running Out of Food in the Last Year: Never true    Ran Out of Food in the Last  Year: Never true  Transportation Needs: No Transportation Needs (04/11/2022)   PRAPARE - Hydrologist (Medical): No    Lack of Transportation (Non-Medical): No  Physical Activity: Insufficiently Active (03/22/2021)   Exercise Vital Sign    Days of Exercise per Week: 5 days    Minutes of Exercise per Session: 20 min  Stress: No Stress Concern Present (04/11/2022)   [place]    Feeling of Stress : Not at all  Social Connections: Moderately Isolated (04/11/2022)   Social Connection and Isolation Panel [NHANES]    Frequency of Communication with Friends and Family: More than three times a week    Frequency of Social Gatherings with Friends and Family: Twice a week    Attends Religious Services: More than 4 times per year    Active Member of [employer] or Organizations: No    Attends Archivist Meetings: Never    Marital Status: Divorced  Human resources officer Violence: Not At Risk (03/22/2021)   Humiliation, Afraid, Rape, and Kick questionnaire    Fear of Current or Ex-Partner: No    Emotionally Abused: No    Physically Abused: No    Sexually Abused: No    Outpatient Medications Prior to Visit  Medication Sig Dispense Refill   amLODipine (NORVASC) 5 MG tablet Take 1 tablet (5 mg total) by mouth daily. 90 tablet 3   Cholecalciferol (VITAMIN D3) 125 MCG (5000 UT) CAPS Take 2 capsules by mouth daily.      levothyroxine (SYNTHROID) 75 MCG tablet TAKE 1 TABLET BY MOUTH DAILY 100 tablet 2   losartan-hydrochlorothiazide (HYZAAR) 100-12.5 MG tablet TAKE 1 TABLET BY MOUTH DAILY 100 tablet 2   Multiple Vitamins-Minerals (HAIR SKIN AND NAILS FORMULA) TABS Take 2 tablets by mouth daily.     rosuvastatin (CRESTOR) 5 MG tablet Take 1 tablet (5 mg total) by mouth daily. 90 tablet 1   RYBELSUS 7 MG TABS TAKE 1 TABLET BY MOUTH DAILY 90 tablet 3   No facility-administered medications prior to visit.     Allergies  Allergen Reactions   Lisinopril Swelling and Other (See Comments)    Lips swelled    ROS Review of Systems  Constitutional:  Negative for chills and fever.  HENT:  Negative for congestion, sinus pressure, sinus pain and sore throat.   Eyes:  Negative for pain and discharge.  Respiratory:  Negative for cough and shortness of breath.   Cardiovascular:  Negative for chest pain and palpitations.  Gastrointestinal:  Negative for abdominal pain,  constipation, diarrhea, nausea and vomiting.  Endocrine: Negative for polydipsia and polyuria.  Genitourinary:  Negative for dysuria and hematuria.  Musculoskeletal:  Positive for arthralgias (R hip and knee). Negative for neck pain and neck stiffness.  Skin:  Negative for rash.  Neurological:  Negative for dizziness and weakness.  Psychiatric/Behavioral:  Negative for agitation and behavioral problems.       Objective:    Physical Exam Vitals reviewed.  Constitutional:      General: She is not in acute distress.    Appearance: She is not diaphoretic.  HENT:     Head: Normocephalic and atraumatic.     Nose: Nose normal. No congestion.     Mouth/Throat:     Mouth: Mucous membranes are moist.     Pharynx: No posterior oropharyngeal erythema.  Eyes:     General: No scleral icterus.    Extraocular Movements: Extraocular movements intact.  Cardiovascular:     Rate and Rhythm: Normal rate and regular rhythm.     Pulses: Normal pulses.     Heart sounds: Normal heart sounds. No murmur heard. Pulmonary:     Breath sounds: Normal breath sounds. No wheezing or rales.  Musculoskeletal:     Cervical back: Neck supple. No tenderness.     Right lower leg: No edema.     Left lower leg: No edema.  Skin:    General: Skin is warm.     Findings: No rash.  Neurological:     General: No focal deficit present.     Mental Status: She is alert and oriented to person, place, and time.     Sensory: No sensory deficit.     Motor: No  weakness.  Psychiatric:        Mood and Affect: Mood normal.        Behavior: Behavior normal.     BP 136/82 (BP Location: Left Arm, Cuff Size: Normal)   Pulse 87   Ht 5\' 6"  (1.676 m)   Wt 198 lb 3.2 oz (89.9 kg)   SpO2 94%   BMI 31.99 kg/m  Wt Readings from Last 3 Encounters:  08/21/22 198 lb 3.2 oz (89.9 kg)  05/17/22 191 lb 12.8 oz (87 kg)  01/09/22 183 lb 6.4 oz (83.2 kg)    Lab Results  Component Value Date   TSH 0.631 05/15/2022   Lab Results  Component Value Date   WBC 6.8 05/15/2022   HGB 11.7 05/15/2022   HCT 37.2 05/15/2022   MCV 80 05/15/2022   PLT 322 05/15/2022   Lab Results  Component Value Date   NA 141 05/15/2022   K 4.6 05/15/2022   CO2 24 05/15/2022   GLUCOSE 111 (H) 05/15/2022   BUN 14 05/15/2022   CREATININE 0.83 05/15/2022   BILITOT <0.2 05/15/2022   ALKPHOS 123 (H) 05/15/2022   AST 16 05/15/2022   ALT 10 05/15/2022   PROT 7.3 05/15/2022   ALBUMIN 4.1 05/15/2022   CALCIUM 9.5 05/15/2022   ANIONGAP 11 10/25/2021   EGFR 78 05/15/2022   Lab Results  Component Value Date   CHOL 166 05/15/2022   Lab Results  Component Value Date   HDL 63 05/15/2022   Lab Results  Component Value Date   LDLCALC 87 05/15/2022   Lab Results  Component Value Date   TRIG 87 05/15/2022   Lab Results  Component Value Date   CHOLHDL 2.6 05/15/2022   Lab Results  Component Value Date   HGBA1C 6.4 08/21/2022  Assessment & Plan:   Problem List Items Addressed This Visit       Cardiovascular and Mediastinum   Essential hypertension    BP Readings from Last 1 Encounters:  08/21/22 136/82  Well-controlled with Losartan-HCTZ 100-12.5 mg QD and Amlodipine 5 mg QD Counseled for compliance with the medications Advised DASH diet and moderate exercise/walking, at least 150 mins/week        Endocrine   Hypothyroidism, adult    Lab Results  Component Value Date   TSH 0.631 05/15/2022  On levothyroxine 75 mcg daily Check TSH and free T4       Relevant Orders   TSH + free T4   Type 2 diabetes mellitus with obesity - Primary    Lab Results  Component Value Date   HGBA1C 6.4 08/21/2022  Well-controlled On Rybelsus 7 mg QD Advised to follow diabetic diet On ARB and statin F/u HbA1C, CMP and lipid panel Diabetic eye exam: Advised to follow up with Ophthalmology for diabetic eye exam      Relevant Orders   CMP14+EGFR   Hemoglobin A1c   POCT glycosylated hemoglobin (Hb A1C) (Completed)     Other   HLD (hyperlipidemia)    Check lipid profile On Crestor      Obesity (BMI 30.0-34.9)    Diet modification advised On GLP-1 agonist for DM      Other Visit Diagnoses     Screening for osteoporosis       Relevant Orders   DG Bone Density       No orders of the defined types were placed in this encounter.   Follow-up: Return in about 4 months (around 12/21/2022) for DM and HTN.    Lindell Spar, MD

## 2022-08-21 NOTE — Assessment & Plan Note (Signed)
Lab Results  Component Value Date   HGBA1C 6.4 08/21/2022   Well-controlled On Rybelsus 7 mg QD Advised to follow diabetic diet On ARB and statin F/u HbA1C, CMP and lipid panel Diabetic eye exam: Advised to follow up with Ophthalmology for diabetic eye exam

## 2022-08-21 NOTE — Assessment & Plan Note (Signed)
BP Readings from Last 1 Encounters:  08/21/22 136/82   Well-controlled with Losartan-HCTZ 100-12.5 mg QD and Amlodipine 5 mg QD Counseled for compliance with the medications Advised DASH diet and moderate exercise/walking, at least 150 mins/week

## 2022-08-21 NOTE — Assessment & Plan Note (Signed)
Lab Results  Component Value Date   TSH 0.631 05/15/2022   On levothyroxine 75 mcg daily Check TSH and free T4 

## 2022-08-26 DIAGNOSIS — J329 Chronic sinusitis, unspecified: Secondary | ICD-10-CM | POA: Diagnosis not present

## 2022-08-28 ENCOUNTER — Ambulatory Visit (HOSPITAL_COMMUNITY)
Admission: RE | Admit: 2022-08-28 | Discharge: 2022-08-28 | Disposition: A | Discharge status: Home / Self Care | Payer: 59 | Source: Ambulatory Visit | Attending: Internal Medicine | Admitting: Internal Medicine

## 2022-08-28 DIAGNOSIS — M8589 Other specified disorders of bone density and structure, multiple sites: Secondary | ICD-10-CM | POA: Insufficient documentation

## 2022-08-28 DIAGNOSIS — Z780 Asymptomatic menopausal state: Secondary | ICD-10-CM | POA: Insufficient documentation

## 2022-08-28 DIAGNOSIS — Z13820 Encounter for screening for osteoporosis: Secondary | ICD-10-CM | POA: Diagnosis not present

## 2022-08-29 ENCOUNTER — Other Ambulatory Visit: Payer: Self-pay | Admitting: Internal Medicine

## 2022-08-29 DIAGNOSIS — E782 Mixed hyperlipidemia: Secondary | ICD-10-CM

## 2022-08-29 MED ORDER — ROSUVASTATIN CALCIUM 5 MG PO TABS: 5.0000 mg | ORAL_TABLET | Freq: Every day | ORAL | 3 refills | Status: DC

## 2022-09-02 DIAGNOSIS — H6501 Acute serous otitis media, right ear: Secondary | ICD-10-CM | POA: Diagnosis not present

## 2022-09-02 DIAGNOSIS — J069 Acute upper respiratory infection, unspecified: Secondary | ICD-10-CM | POA: Diagnosis not present

## 2022-09-16 DIAGNOSIS — J449 Chronic obstructive pulmonary disease, unspecified: Secondary | ICD-10-CM | POA: Diagnosis not present

## 2022-10-16 DIAGNOSIS — J449 Chronic obstructive pulmonary disease, unspecified: Secondary | ICD-10-CM | POA: Diagnosis not present

## 2022-11-16 DIAGNOSIS — J449 Chronic obstructive pulmonary disease, unspecified: Secondary | ICD-10-CM | POA: Diagnosis not present

## 2022-12-16 DIAGNOSIS — J449 Chronic obstructive pulmonary disease, unspecified: Secondary | ICD-10-CM | POA: Diagnosis not present

## 2022-12-19 DIAGNOSIS — E1169 Type 2 diabetes mellitus with other specified complication: Secondary | ICD-10-CM | POA: Diagnosis not present

## 2022-12-19 DIAGNOSIS — E039 Hypothyroidism, unspecified: Secondary | ICD-10-CM | POA: Diagnosis not present

## 2022-12-24 ENCOUNTER — Ambulatory Visit: Payer: 59 | Admitting: Internal Medicine

## 2023-01-03 ENCOUNTER — Ambulatory Visit: Payer: 59 | Admitting: Internal Medicine

## 2023-01-07 ENCOUNTER — Encounter: Payer: Self-pay | Admitting: Internal Medicine

## 2023-01-07 ENCOUNTER — Ambulatory Visit (INDEPENDENT_AMBULATORY_CARE_PROVIDER_SITE_OTHER): Payer: 59 | Admitting: Internal Medicine

## 2023-01-07 VITALS — BP 156/74 | HR 76 | Ht 66.0 in | Wt 207.2 lb

## 2023-01-07 DIAGNOSIS — E669 Obesity, unspecified: Secondary | ICD-10-CM

## 2023-01-07 DIAGNOSIS — M1611 Unilateral primary osteoarthritis, right hip: Secondary | ICD-10-CM

## 2023-01-07 DIAGNOSIS — Z7984 Long term (current) use of oral hypoglycemic drugs: Secondary | ICD-10-CM

## 2023-01-07 DIAGNOSIS — I10 Essential (primary) hypertension: Secondary | ICD-10-CM

## 2023-01-07 DIAGNOSIS — E1169 Type 2 diabetes mellitus with other specified complication: Secondary | ICD-10-CM | POA: Diagnosis not present

## 2023-01-07 DIAGNOSIS — E039 Hypothyroidism, unspecified: Secondary | ICD-10-CM

## 2023-01-07 MED ORDER — LOSARTAN POTASSIUM-HCTZ 100-25 MG PO TABS
1.0000 | ORAL_TABLET | Freq: Every day | ORAL | 3 refills | Status: DC
Start: 2023-01-07 — End: 2023-09-09

## 2023-01-07 NOTE — Progress Notes (Signed)
Established Patient Office Visit  Subjective:  Patient ID: Jeanette Compton, female    DOB: Nov 28, 1955  Age: 67 y.o. MRN: [id number]  CC:  Chief Complaint  Patient presents with   Diabetes    Follow up    Hypertension    Follow up    HPI Jeanette Compton is a 67 y.o. female with past medical history of HTN, type II DM, hypothyroidism, OA of hip and knee, vitiligo and obesity who presents for f/u of her chronic medical conditions.  HTN: Her BP is elevated today.  She has been taking losartan HCTZ 100-12.5 mg and amlodipine 5 mg daily.  She denies any headache, dizziness, chest pain, dyspnea or palpitations.  Type II DM: She has ben taking Rybelsus for it. Her HbA1C was 6.8 recently. She denies any fatigue, polyuria, polydipsia or polyphagia. She had lost about 14 lbs since starting Rybelsus, but has gained 9 lbs since the last visit.  She admits that she has been eating sweets and ice cream and needs to improve her diet.  She has chronic right hip pain, which is aggravated with humidity.  She attributes elevated BP to hip pain.  She has had right THA revision surgery in 2023.   Past Medical History:  Diagnosis Date   Arthritis    knee , ankle and back   Bronchitis    Cataract    Diabetes mellitus without complication (HCC) 02/10/2019   Hyperlipidemia    Hypertension    Hypothyroidism, adult 02/10/2019   Neuralgia, post-herpetic 11/13/2016   Obesity (BMI 30.0-34.9) 02/10/2019   Thyroid disease    Vitamin D deficiency disease 02/10/2019    Past Surgical History:  Procedure Laterality Date   ABDOMINAL HYSTERECTOMY     fibroid   ANTERIOR HIP REVISION Right 10/24/2021   Procedure: RIGHT ANTERIOR HIP REVISION;  Surgeon: Kathryne Hitch, MD;  Location: [facility];  Service: Orthopedics;  Laterality: Right;   BREAST BIOPSY Right 07/31/2017   lumpectomy for papilloma   BREAST SURGERY Right    COLONOSCOPY  09/13/2011   Procedure: COLONOSCOPY;  Surgeon: Malissa Hippo, MD;   Location: [facility];  Service: Endoscopy;  Laterality: N/A;  1030   COLONOSCOPY N/A 07/31/2018   Procedure: COLONOSCOPY;  Surgeon: Malissa Hippo, MD;  Location: [facility];  Service: Endoscopy;  Laterality: N/A;   POLYPECTOMY  07/31/2018   Procedure: POLYPECTOMY;  Surgeon: Malissa Hippo, MD;  Location: [facility];  Service: Endoscopy;;   TOTAL HIP ARTHROPLASTY Right 12/13/2017   Procedure: RIGHT TOTAL HIP ARTHROPLASTY ANTERIOR APPROACH;  Surgeon: Kathryne Hitch, MD;  Location: [facility];  Service: Orthopedics;  Laterality: Right;    Family History  Problem Relation Age of Onset   Stroke Mother    Diabetes Mother    Alcohol abuse Father    Early death Father    Early death Sister        pneumonia   Cancer Brother    Alzheimer's disease Sister    COPD Brother    Colon cancer Neg Hx     Social History   Socioeconomic History   Marital status: Single    Spouse name: Not on file   Number of children: 3   Years of education: 10   Highest education level: Not on file  Occupational History   Occupation: disability  Tobacco Use   Smoking status: Former    Current packs/day: 0.00    Average packs/day: 0.5 packs/day for 3.0  years (1.5 ttl pk-yrs)    Types: Cigarettes    Start date: 74    Quit date: 77    Years since quitting: 43.6   Smokeless tobacco: Never  Vaping Use   Vaping status: Never Used  Substance and Sexual Activity   Alcohol use: No   Drug use: Yes    Types: Marijuana   Sexual activity: Never    Birth control/protection: Surgical  Other Topics Concern   Not on file  Social History Narrative      Comments: Divorced x 3. Single. Has 3 children (2 boys and 1 girl), 9 grandchildren, 1 great-grandchild. Disability from arthritis in back. 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      Social Determinants of Health   Financial Resource Strain: Low Risk  (04/11/2022)   Overall Financial  Resource Strain (CARDIA)    Difficulty of Paying Living Expenses: Not hard at all  Food Insecurity: No Food Insecurity (04/11/2022)   Hunger Vital Sign    Worried About Running Out of Food in the Last Year: Never true    Ran Out of Food in the Last Year: Never true  Transportation Needs: No Transportation Needs (04/11/2022)   PRAPARE - Administrator, Civil Service (Medical): No    Lack of Transportation (Non-Medical): No  Physical Activity: Insufficiently Active (03/22/2021)   Exercise Vital Sign    Days of Exercise per Week: 5 days    Minutes of Exercise per Session: 20 min  Stress: No Stress Concern Present (04/11/2022)   [employer] of Occupational Health - Occupational Stress Questionnaire    Feeling of Stress : Not at all  Social Connections: Moderately Isolated (04/11/2022)   Social Connection and Isolation Panel [NHANES]    Frequency of Communication with Friends and Family: More than three times a week    Frequency of Social Gatherings with Friends and Family: Twice a week    Attends Religious Services: More than 4 times per year    Active Member of [employer] or Organizations: No    Attends Banker Meetings: Never    Marital Status: Divorced  Catering manager Violence: Unknown (08/23/2021)   Received from [employer], [facility]   HITS    Physically Hurt: Not on file    Insult or Talk Down To: Not on file    Threaten Physical Harm: Not on file    Scream or Curse: Not on file    Outpatient Medications Prior to Visit  Medication Sig Dispense Refill   amLODipine (NORVASC) 5 MG tablet Take 1 tablet (5 mg total) by mouth daily. 90 tablet 3   Cholecalciferol (VITAMIN D3) 125 MCG (5000 UT) CAPS Take 2 capsules by mouth daily.      levothyroxine (SYNTHROID) 75 MCG tablet TAKE 1 TABLET BY MOUTH DAILY 100 tablet 2   Multiple Vitamins-Minerals (HAIR SKIN AND NAILS FORMULA) TABS Take 2 tablets by mouth daily.     rosuvastatin (CRESTOR) 5 MG tablet Take  1 tablet (5 mg total) by mouth daily. 90 tablet 3   RYBELSUS 7 MG TABS TAKE 1 TABLET BY MOUTH DAILY 90 tablet 3   losartan-hydrochlorothiazide (HYZAAR) 100-12.5 MG tablet TAKE 1 TABLET BY MOUTH DAILY 100 tablet 2   No facility-administered medications prior to visit.    Allergies  Allergen Reactions   Lisinopril Swelling and Other (See Comments)    Lips swelled    ROS Review of Systems  Constitutional:  Negative for chills and fever.  HENT:  Negative for congestion, sinus pressure, sinus pain and sore throat.   Eyes:  Negative for pain and discharge.  Respiratory:  Negative for cough and shortness of breath.   Cardiovascular:  Negative for chest pain and palpitations.  Gastrointestinal:  Negative for abdominal pain, constipation, diarrhea, nausea and vomiting.  Endocrine: Negative for polydipsia and polyuria.  Genitourinary:  Negative for dysuria and hematuria.  Musculoskeletal:  Positive for arthralgias (R hip and knee). Negative for neck pain and neck stiffness.  Skin:  Negative for rash.  Neurological:  Negative for dizziness and weakness.  Psychiatric/Behavioral:  Negative for agitation and behavioral problems.       Objective:    Physical Exam Vitals reviewed.  Constitutional:      General: She is not in acute distress.    Appearance: She is not diaphoretic.  HENT:     Head: Normocephalic and atraumatic.     Nose: Nose normal. No congestion.     Mouth/Throat:     Mouth: Mucous membranes are moist.     Pharynx: No posterior oropharyngeal erythema.  Eyes:     General: No scleral icterus.    Extraocular Movements: Extraocular movements intact.  Cardiovascular:     Rate and Rhythm: Normal rate and regular rhythm.     Pulses: Normal pulses.     Heart sounds: Normal heart sounds. No murmur heard. Pulmonary:     Breath sounds: Normal breath sounds. No wheezing or rales.  Musculoskeletal:     Cervical back: Neck supple. No tenderness.     Right lower leg: No edema.      Left lower leg: No edema.  Skin:    General: Skin is warm.     Findings: No rash.  Neurological:     General: No focal deficit present.     Mental Status: She is alert and oriented to person, place, and time.     Sensory: No sensory deficit.     Motor: No weakness.  Psychiatric:        Mood and Affect: Mood normal.        Behavior: Behavior normal.     BP (!) 156/74 (BP Location: Right Arm, Patient Position: Sitting, Cuff Size: Normal)   Pulse 76   Ht 5\' 6"  (1.676 m)   Wt 207 lb 3.2 oz (94 kg)   SpO2 95%   BMI 33.44 kg/m  Wt Readings from Last 3 Encounters:  01/07/23 207 lb 3.2 oz (94 kg)  08/21/22 198 lb 3.2 oz (89.9 kg)  05/17/22 191 lb 12.8 oz (87 kg)    Lab Results  Component Value Date   TSH 0.776 12/19/2022   Lab Results  Component Value Date   WBC 6.8 05/15/2022   HGB 11.7 05/15/2022   HCT 37.2 05/15/2022   MCV 80 05/15/2022   PLT 322 05/15/2022   Lab Results  Component Value Date   NA 141 12/19/2022   K 4.7 12/19/2022   CO2 24 12/19/2022   GLUCOSE 135 (H) 12/19/2022   BUN 15 12/19/2022   CREATININE 0.70 12/19/2022   BILITOT 0.3 12/19/2022   ALKPHOS 113 12/19/2022   AST 17 12/19/2022   ALT 15 12/19/2022   PROT 7.9 12/19/2022   ALBUMIN 4.4 12/19/2022   CALCIUM 10.5 (H) 12/19/2022   ANIONGAP 11 10/25/2021   EGFR 95 12/19/2022   Lab Results  Component Value Date   CHOL 166 05/15/2022   Lab Results  Component  Value Date   HDL 63 05/15/2022   Lab Results  Component Value Date   LDLCALC 87 05/15/2022   Lab Results  Component Value Date   TRIG 87 05/15/2022   Lab Results  Component Value Date   CHOLHDL 2.6 05/15/2022   Lab Results  Component Value Date   HGBA1C 6.8 (H) 12/19/2022      Assessment & Plan:   Problem List Items Addressed This Visit       Cardiovascular and Mediastinum   Essential hypertension    BP Readings from Last 1 Encounters:  01/07/23 (!) 156/74   Uncontrolled with Losartan-HCTZ 100-12.5 mg QD and  Amlodipine 5 mg QD Increased dose of Losartan-hydrochlorothiazide to 100-25 mg QD Counseled for compliance with the medications Advised DASH diet and moderate exercise/walking, at least 150 mins/week      Relevant Medications   losartan-hydrochlorothiazide (HYZAAR) 100-25 MG tablet     Endocrine   Hypothyroidism, adult    Lab Results  Component Value Date   TSH 0.776 12/19/2022   On levothyroxine 75 mcg daily Checked TSH and free T4      Type 2 diabetes mellitus with obesity (HCC) - Primary    Lab Results  Component Value Date   HGBA1C 6.8 (H) 12/19/2022   Well-controlled On Rybelsus 7 mg QD Advised to follow diabetic diet On ARB and statin F/u HbA1C, CMP and lipid panel Diabetic eye exam: Advised to follow up with Ophthalmology for diabetic eye exam      Relevant Medications   losartan-hydrochlorothiazide (HYZAAR) 100-25 MG tablet     Musculoskeletal and Integument   Unilateral primary osteoarthritis, right hip    S/p right hip arthroplasty, had revision surgery in 2023 Needs to take Tylenol as needed for pain        Meds ordered this encounter  Medications   losartan-hydrochlorothiazide (HYZAAR) 100-25 MG tablet    Sig: Take 1 tablet by mouth daily.    Dispense:  90 tablet    Refill:  3    Dose change    Follow-up: Return in about 3 months (around 04/09/2023) for Annual physical.    Anabel Halon, MD

## 2023-01-07 NOTE — Assessment & Plan Note (Addendum)
Lab Results  Component Value Date   HGBA1C 6.8 (H) 12/19/2022   Well-controlled On Rybelsus 7 mg QD Advised to follow diabetic diet On ARB and statin F/u HbA1C, CMP and lipid panel Diabetic eye exam: Advised to follow up with Ophthalmology for diabetic eye exam

## 2023-01-07 NOTE — Assessment & Plan Note (Addendum)
Lab Results  Component Value Date   TSH 0.776 12/19/2022   On levothyroxine 75 mcg daily Checked TSH and free T4

## 2023-01-07 NOTE — Patient Instructions (Signed)
Please continue to take medications as prescribed.  Please continue to follow low carb diet and perform moderate exercise/walking at least 150 mins/week.  Please get fasting blood tests done before the next visit.  Please consider getting Shingrix and Tdap vaccine at local pharmacy.

## 2023-01-07 NOTE — Assessment & Plan Note (Signed)
S/p right hip arthroplasty, had revision surgery in 2023 Needs to take Tylenol as needed for pain

## 2023-01-07 NOTE — Assessment & Plan Note (Addendum)
BP Readings from Last 1 Encounters:  01/07/23 (!) 156/74   Uncontrolled with Losartan-HCTZ 100-12.5 mg QD and Amlodipine 5 mg QD Increased dose of Losartan-hydrochlorothiazide to 100-25 mg QD Counseled for compliance with the medications Advised DASH diet and moderate exercise/walking, at least 150 mins/week

## 2023-01-14 ENCOUNTER — Other Ambulatory Visit: Payer: Self-pay | Admitting: Internal Medicine

## 2023-01-14 DIAGNOSIS — E039 Hypothyroidism, unspecified: Secondary | ICD-10-CM

## 2023-01-16 DIAGNOSIS — J449 Chronic obstructive pulmonary disease, unspecified: Secondary | ICD-10-CM | POA: Diagnosis not present

## 2023-01-20 ENCOUNTER — Other Ambulatory Visit: Payer: Self-pay | Admitting: Internal Medicine

## 2023-01-20 DIAGNOSIS — I10 Essential (primary) hypertension: Secondary | ICD-10-CM

## 2023-01-22 ENCOUNTER — Telehealth: Payer: Self-pay | Admitting: Internal Medicine

## 2023-01-22 NOTE — Telephone Encounter (Signed)
Marva from [facility] says they are needing approval for pt Jeanette Compton. Please advise [phone] ref #[id number] Thank you

## 2023-01-22 NOTE — Telephone Encounter (Signed)
Awaiting fax per pharmacist

## 2023-02-16 DIAGNOSIS — J449 Chronic obstructive pulmonary disease, unspecified: Secondary | ICD-10-CM | POA: Diagnosis not present

## 2023-03-18 DIAGNOSIS — J449 Chronic obstructive pulmonary disease, unspecified: Secondary | ICD-10-CM | POA: Diagnosis not present

## 2023-04-10 ENCOUNTER — Encounter: Payer: 59 | Admitting: Internal Medicine

## 2023-04-18 DIAGNOSIS — J449 Chronic obstructive pulmonary disease, unspecified: Secondary | ICD-10-CM | POA: Diagnosis not present

## 2023-04-28 ENCOUNTER — Other Ambulatory Visit: Payer: Self-pay | Admitting: Internal Medicine

## 2023-04-28 DIAGNOSIS — E782 Mixed hyperlipidemia: Secondary | ICD-10-CM

## 2023-05-08 ENCOUNTER — Ambulatory Visit (INDEPENDENT_AMBULATORY_CARE_PROVIDER_SITE_OTHER): Payer: 59

## 2023-05-08 VITALS — Ht 67.0 in | Wt 191.0 lb

## 2023-05-08 DIAGNOSIS — Z0000 Encounter for general adult medical examination without abnormal findings: Secondary | ICD-10-CM

## 2023-05-08 DIAGNOSIS — Z0001 Encounter for general adult medical examination with abnormal findings: Secondary | ICD-10-CM

## 2023-05-08 DIAGNOSIS — E1169 Type 2 diabetes mellitus with other specified complication: Secondary | ICD-10-CM

## 2023-05-08 DIAGNOSIS — Z1211 Encounter for screening for malignant neoplasm of colon: Secondary | ICD-10-CM

## 2023-05-08 DIAGNOSIS — E669 Obesity, unspecified: Secondary | ICD-10-CM

## 2023-05-08 NOTE — Patient Instructions (Signed)
[name] , Thank you for taking time to come for your Medicare Wellness Visit. I appreciate your ongoing commitment to your health goals. Please review the following plan we discussed and let me know if I can assist you in the future.   Referrals/Orders/Follow-Ups/Clinician Recommendations:  You have been referred to see a GI doctor for a repeat colonoscopy. If you haven't heard from them is 5 business days, please call them at the number below to schedule that appointment.   [facility] at [facility] Dr. Grant Fontana [address], [place], [place] [address]  Phone: [phone]   A urine test has been ordered for you to monitor your diabetes. Please have this completed at [facility]  Next Medicare Annual Wellness Visit: May 11, 2024 at 11:20 am video visit     This is a list of the screening recommended for you and due dates:  Health Maintenance  Topic Date Due   Zoster (Shingles) Vaccine (1 of 2) Never done   DTaP/Tdap/Td vaccine (2 - Tdap) 05/22/2019   COVID-19 Vaccine (3 - Moderna risk series) 12/17/2019   Eye exam for diabetics  05/18/2022   Flu Shot  12/20/2022   Yearly kidney health urinalysis for diabetes  05/18/2023   Colon Cancer Screening  07/31/2023   Hemoglobin A1C  06/21/2023   Mammogram  08/20/2023   Yearly kidney function blood test for diabetes  12/19/2023   Complete foot exam   01/07/2024   Medicare Annual Wellness Visit  05/07/2024   DEXA scan (bone density measurement)  08/27/2024   Pneumonia Vaccine (3 of 3 - PPSV23 or PCV20) 04/11/2025   Hepatitis C Screening  Completed   HPV Vaccine  Aged Out    Advanced directives: (Declined) Advance directive discussed with you today. Even though you declined this today, please call our office should you change your mind, and we can give you the proper paperwork for you to fill out.  Next Medicare Annual Wellness Visit scheduled for next year: Yes  Preventive  Care 15 Years and Older, Female Preventive care refers to lifestyle choices and visits with your health care provider that can promote health and wellness. Preventive care visits are also called wellness exams. What can I expect for my preventive care visit? Counseling Your health care provider may ask you questions about your: Medical history, including: Past medical problems. Family medical history. Pregnancy and menstrual history. History of falls. Current health, including: Memory and ability to understand (cognition). Emotional well-being. Home life and relationship well-being. Sexual activity and sexual health. Lifestyle, including: Alcohol, nicotine or tobacco, and drug use. Access to firearms. Diet, exercise, and sleep habits. Work and work Astronomer. Sunscreen use. Safety issues such as seatbelt and bike helmet use. Physical exam Your health care provider will check your: Height and weight. These may be used to calculate your BMI (body mass index). BMI is a measurement that tells if you are at a healthy weight. Waist circumference. This measures the distance around your waistline. This measurement also tells if you are at a healthy weight and may help predict your risk of certain diseases, such as type 2 diabetes and high blood pressure. Heart rate and blood pressure. Body temperature. Skin for abnormal spots. What immunizations do I need?  Vaccines are usually given at various ages, according to a schedule. Your health care provider will recommend vaccines for you based on your age, medical history, and lifestyle or other factors, such as travel  or where you work. What tests do I need? Screening Your health care provider may recommend screening tests for certain conditions. This may include: Lipid and cholesterol levels. Hepatitis C test. Hepatitis B test. HIV (human immunodeficiency virus) test. STI (sexually transmitted infection) testing, if you are at  risk. Lung cancer screening. Colorectal cancer screening. Diabetes screening. This is done by checking your blood sugar (glucose) after you have not eaten for a while (fasting). Mammogram. Talk with your health care provider about how often you should have regular mammograms. BRCA-related cancer screening. This may be done if you have a family history of breast, ovarian, tubal, or peritoneal cancers. Bone density scan. This is done to screen for osteoporosis. Talk with your health care provider about your test results, treatment options, and if necessary, the need for more tests. Follow these instructions at home: Eating and drinking  Eat a diet that includes fresh fruits and vegetables, whole grains, lean protein, and low-fat dairy products. Limit your intake of foods with high amounts of sugar, saturated fats, and salt. Take vitamin and mineral supplements as recommended by your health care provider. Do not drink alcohol if your health care provider tells you not to drink. If you drink alcohol: Limit how much you have to 0-1 drink a day. Know how much alcohol is in your drink. In the [place] one drink equals one 12 oz bottle of beer (355 mL), one 5 oz glass of wine (148 mL), or one 1 oz glass of hard liquor (44 mL). Lifestyle Brush your teeth every morning and night with fluoride toothpaste. Floss one time each day. Exercise for at least 30 minutes 5 or more days each week. Do not use any products that contain nicotine or tobacco. These products include cigarettes, chewing tobacco, and vaping devices, such as e-cigarettes. If you need help quitting, ask your health care provider. Do not use drugs. If you are sexually active, practice safe sex. Use a condom or other form of protection in order to prevent STIs. Take aspirin only as told by your health care provider. Make sure that you understand how much to take and what form to take. Work with your health care provider to find out whether it  is safe and beneficial for you to take aspirin daily. Ask your health care provider if you need to take a cholesterol-lowering medicine (statin). Find healthy ways to manage stress, such as: Meditation, yoga, or listening to music. Journaling. Talking to a trusted person. Spending time with friends and family. Minimize exposure to UV radiation to reduce your risk of skin cancer. Safety Always wear your seat belt while driving or riding in a vehicle. Do not drive: If you have been drinking alcohol. Do not ride with someone who has been drinking. When you are tired or distracted. While texting. If you have been using any mind-altering substances or drugs. Wear a helmet and other protective equipment during sports activities. If you have firearms in your house, make sure you follow all gun safety procedures. What's next? Visit your health care provider once a year for an annual wellness visit. Ask your health care provider how often you should have your eyes and teeth checked. Stay up to date on all vaccines. This information is not intended to replace advice given to you by your health care provider. Make sure you discuss any questions you have with your health care provider. Document Revised: 11/02/2020 Document Reviewed: 11/02/2020 Elsevier Patient Education  2024 [employer]. Understanding Your Risk  for Falls Millions of people have serious injuries from falls each year. It is important to understand your risk of falling. Talk with your health care provider about your risk and what you can do to lower it. If you do have a serious fall, make sure to tell your provider. Falling once raises your risk of falling again. How can falls affect me? Serious injuries from falls are common. These include: Broken bones, such as hip fractures. Head injuries, such as traumatic brain injuries (TBI) or concussions. A fear of falling can cause you to avoid activities and stay at home. This can make  your muscles weaker and raise your risk for a fall. What can increase my risk? There are a number of risk factors that increase your risk for falling. The more risk factors you have, the higher your risk of falling. Serious injuries from a fall happen most often to people who are older than 66 years old. Teenagers and young adults ages 23-29 are also at higher risk. Common risk factors include: Weakness in the lower body. Being generally weak or confused due to long-term (chronic) illness. Dizziness or balance problems. Poor vision. Medicines that cause dizziness or drowsiness. These may include: Medicines for your blood pressure, heart, anxiety, insomnia, or swelling (edema). Pain medicines. Muscle relaxants. Other risk factors include: Drinking alcohol. Having had a fall in the past. Having foot pain or wearing improper footwear. Working at a dangerous job. Having any of the following in your home: Tripping hazards, such as floor clutter or loose rugs. Poor lighting. Pets. Having dementia or memory loss. What actions can I take to lower my risk of falling?     Physical activity Stay physically fit. Do strength and balance exercises. Consider taking a regular class to build strength and balance. Yoga and tai chi are good options. Vision Have your eyes checked every year and your prescription for glasses or contacts updated as needed. Shoes and walking aids Wear non-skid shoes. Wear shoes that have rubber soles and low heels. Do not wear high heels. Do not walk around the house in socks or slippers. Use a cane or walker as told by your provider. Home safety Attach secure railings on both sides of your stairs. Install grab bars for your bathtub, shower, and toilet. Use a non-skid mat in your bathtub or shower. Attach bath mats securely with double-sided, non-slip rug tape. Use good lighting in all rooms. Keep a flashlight near your bed. Make sure there is a clear path from your  bed to the bathroom. Use night-lights. Do not use throw rugs. Make sure all carpeting is taped or tacked down securely. Remove all clutter from walkways and stairways, including extension cords. Repair uneven or broken steps and floors. Avoid walking on icy or slippery surfaces. Walk on the grass instead of on icy or slick sidewalks. Use ice melter to get rid of ice on walkways in the winter. Use a cordless phone. Questions to ask your health care provider Can you help me check my risk for a fall? Do any of my medicines make me more likely to fall? Should I take a vitamin D supplement? What exercises can I do to improve my strength and balance? Should I make an appointment to have my vision checked? Do I need a bone density test to check for weak bones (osteoporosis)? Would it help to use a cane or a walker? Where to find more information Centers for Disease Control and Prevention, STEADI: TonerPromos.no Community-Based Fall  Prevention Programs: TonerPromos.no [employer] on Aging: BaseRingTones.pl Contact a health care provider if: You fall at home. You are afraid of falling at home. You feel weak, drowsy, or dizzy. This information is not intended to replace advice given to you by your health care provider. Make sure you discuss any questions you have with your health care provider. Document Revised: 01/08/2022 Document Reviewed: 01/08/2022 Elsevier Patient Education  2024 [employer].

## 2023-05-08 NOTE — Progress Notes (Signed)
 Because this visit was a virtual/telehealth visit,  certain criteria was not obtained, such a blood pressure, CBG if applicable, and timed get up and go. Any medications not marked as "taking" were not mentioned during the medication reconciliation part of the visit. Any vitals not documented were not able to be obtained due to this being a telehealth visit or patient was unable to self-report a recent blood pressure reading due to a lack of equipment at home via telehealth. Vitals that have been documented are verbally provided by the patient.   Subjective:   Jeanette Compton is a 67 y.o. female who presents for Medicare Annual (Subsequent) preventive examination.  Visit Complete: Virtual I connected with  Narda Amber on 05/08/23 by a audio enabled telemedicine application and verified that I am speaking with the correct person using two identifiers.  Patient Location: Home  Provider Location: Home Office  I discussed the limitations of evaluation and management by telemedicine. The patient expressed understanding and agreed to proceed.  Vital Signs: Because this visit was a virtual/telehealth visit, some criteria may be missing or patient reported. Any vitals not documented were not able to be obtained and vitals that have been documented are patient reported.  Patient Medicare AWV questionnaire was completed by the patient on na; I have confirmed that all information answered by patient is correct and no changes since this date.  Cardiac Risk Factors include: advanced age (>50men, >7 women);diabetes mellitus;dyslipidemia;hypertension;sedentary lifestyle     Objective:    Today's Vitals   05/08/23 1356  Weight: 191 lb (86.6 kg)  Height: 5\' 7"  (1.702 m)   Body mass index is 29.91 kg/m.     05/08/2023    1:54 PM 04/11/2022    1:07 PM 10/18/2021   10:14 AM 04/24/2021    5:29 PM 03/22/2021    2:56 PM 07/31/2018   10:57 AM 12/13/2017   10:40 AM  Advanced Directives  Does Patient  Have a Medical Advance Directive? No No No No No No No  Would patient like information on creating a medical advance directive? No - Patient declined No - Patient declined No - Patient declined No - Patient declined No - Patient declined No - Patient declined No - Patient declined    Current Medications (verified) Outpatient Encounter Medications as of 05/08/2023  Medication Sig   amLODipine (NORVASC) 5 MG tablet TAKE 1 TABLET BY MOUTH DAILY   Cholecalciferol (VITAMIN D3) 125 MCG (5000 UT) CAPS Take 2 capsules by mouth daily.    levothyroxine (SYNTHROID) 75 MCG tablet TAKE 1 TABLET BY MOUTH DAILY   losartan-hydrochlorothiazide (HYZAAR) 100-25 MG tablet Take 1 tablet by mouth daily.   Multiple Vitamins-Minerals (HAIR SKIN AND NAILS FORMULA) TABS Take 2 tablets by mouth daily.   rosuvastatin (CRESTOR) 5 MG tablet TAKE 1 TABLET BY MOUTH DAILY   RYBELSUS 7 MG TABS TAKE 1 TABLET BY MOUTH DAILY   No facility-administered encounter medications on file as of 05/08/2023.    Allergies (verified) Lisinopril   History: Past Medical History:  Diagnosis Date   Arthritis    knee , ankle and back   Bronchitis    Cataract    Diabetes mellitus without complication (HCC) 02/10/2019   Hyperlipidemia    Hypertension    Hypothyroidism, adult 02/10/2019   Neuralgia, post-herpetic 11/13/2016   Obesity (BMI 30.0-34.9) 02/10/2019   Thyroid disease    Vitamin D deficiency disease 02/10/2019   Past Surgical History:  Procedure Laterality Date   ABDOMINAL HYSTERECTOMY  fibroid   ANTERIOR HIP REVISION Right 10/24/2021   Procedure: RIGHT ANTERIOR HIP REVISION;  Surgeon: Kathryne Hitch, MD;  Location: [facility] OR;  Service: Orthopedics;  Laterality: Right;   BREAST BIOPSY Right 07/31/2017   lumpectomy for papilloma   BREAST SURGERY Right    COLONOSCOPY  09/13/2011   Procedure: COLONOSCOPY;  Surgeon: Malissa Hippo, MD;  Location: [facility];  Service: Endoscopy;  Laterality: N/A;  1030    COLONOSCOPY N/A 07/31/2018   Procedure: COLONOSCOPY;  Surgeon: Malissa Hippo, MD;  Location: [facility];  Service: Endoscopy;  Laterality: N/A;   POLYPECTOMY  07/31/2018   Procedure: POLYPECTOMY;  Surgeon: Malissa Hippo, MD;  Location: [facility];  Service: Endoscopy;;   TOTAL HIP ARTHROPLASTY Right 12/13/2017   Procedure: RIGHT TOTAL HIP ARTHROPLASTY ANTERIOR APPROACH;  Surgeon: Kathryne Hitch, MD;  Location: [facility];  Service: Orthopedics;  Laterality: Right;   Family History  Problem Relation Age of Onset   Stroke Mother    Diabetes Mother    Alcohol abuse Father    Early death Father    Early death Sister        pneumonia   Cancer Brother    Alzheimer's disease Sister    COPD Brother    Colon cancer Neg Hx    Social History   Socioeconomic History   Marital status: Single    Spouse name: Not on file   Number of children: 3   Years of education: 10   Highest education level: Not on file  Occupational History   Occupation: disability  Tobacco Use   Smoking status: Former    Current packs/day: 0.00    Average packs/day: 0.5 packs/day for 3.0 years (1.5 ttl pk-yrs)    Types: Cigarettes    Start date: 89    Quit date: 1981    Years since quitting: 43.9   Smokeless tobacco: Never  Vaping Use   Vaping status: Never Used  Substance and Sexual Activity   Alcohol use: No   Drug use: Yes    Types: Marijuana   Sexual activity: Never    Birth control/protection: Surgical  Other Topics Concern   Not on file  Social History Narrative      Comments: Divorced x 3. Single. Has 3 children (2 boys and 1 girl), 9 grandchildren, 1 great-grandchild. Disability from arthritis in back. 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      Social Drivers of Corporate investment banker Strain: Low Risk  (05/08/2023)   Overall Financial Resource Strain (CARDIA)    Difficulty of Paying Living Expenses: Not hard at all   Food Insecurity: No Food Insecurity (05/08/2023)   Hunger Vital Sign    Worried About Running Out of Food in the Last Year: Never true    Ran Out of Food in the Last Year: Never true  Transportation Needs: No Transportation Needs (05/08/2023)   PRAPARE - Administrator, Civil Service (Medical): No    Lack of Transportation (Non-Medical): No  Physical Activity: Insufficiently Active (05/08/2023)   Exercise Vital Sign    Days of Exercise per Week: 3 days    Minutes of Exercise per Session: 20 min  Stress: No Stress Concern Present (05/08/2023)   [employer] of Occupational Health - Occupational Stress Questionnaire    Feeling of Stress : Not at all  Social Connections: Moderately Isolated (05/08/2023)   Social Connection  and Isolation Panel [NHANES]    Frequency of Communication with Friends and Family: More than three times a week    Frequency of Social Gatherings with Friends and Family: Twice a week    Attends Religious Services: More than 4 times per year    Active Member of [employer] or Organizations: No    Attends Engineer, structural: Never    Marital Status: Divorced    Tobacco Counseling Counseling given: Not Answered   Clinical Intake:  Pre-visit preparation completed: Yes  Pain : No/denies pain     BMI - recorded: 29.91 Nutritional Status: BMI 25 -29 Overweight Nutritional Risks: None Diabetes: Yes CBG done?: No Did pt. bring in CBG monitor from home?: No  How often do you need to have someone help you when you read instructions, pamphlets, or other written materials from your doctor or pharmacy?: 1 - Never  Interpreter Needed?: No  Information entered by ::  Jahnay Lantier, CMA   Activities of Daily Living    05/08/2023    2:11 PM  In your present state of health, do you have any difficulty performing the following activities:  Hearing? 0  Vision? 0  Difficulty concentrating or making decisions? 0  Walking or climbing stairs? 0   Dressing or bathing? 0  Doing errands, shopping? 0  Preparing Food and eating ? N  Using the Toilet? N  In the past six months, have you accidently leaked urine? N  Do you have problems with loss of bowel control? N  Managing your Medications? N  Managing your Finances? N  Housekeeping or managing your Housekeeping? N    Patient Care Team: Anabel Halon, MD as PCP - General (Internal Medicine)  Indicate any recent Medical Services you may have received from other than [facility] providers in the past year (date may be approximate).     Assessment:   This is a routine wellness examination for [place].  Hearing/Vision screen Hearing Screening - Comments:: Patient denies any hearing difficulties.   Vision Screening - Comments:: Wears rx glasses - up to date with routine eye exams  Dr. [facility]   Goals Addressed             This Visit's Progress    Patient Stated       Remain healthy and active.         Depression Screen    05/08/2023    2:03 PM 01/07/2023    4:18 PM 08/21/2022    3:46 PM 05/17/2022    3:27 PM 04/11/2022    1:07 PM 01/09/2022    2:33 PM 09/20/2021    4:16 PM  PHQ 2/9 Scores  PHQ - 2 Score 0 0 0 0 0 0 0  PHQ- 9 Score 0          Fall Risk    05/08/2023    2:11 PM 01/07/2023    4:18 PM 08/21/2022    3:46 PM 05/17/2022    3:27 PM 04/11/2022    1:07 PM  Fall Risk   Falls in the past year? 0 0 0 0 0  Number falls in past yr: 0 0 0 0 0  Injury with Fall? 0 0 0 0 0  Risk for fall due to : No Fall Risks    No Fall Risks  Follow up Falls prevention discussed    Falls evaluation completed    MEDICARE RISK AT HOME: Medicare Risk at Home Any stairs in or  around the home?: No If so, are there any without handrails?: No Home free of loose throw rugs in walkways, pet beds, electrical cords, etc?: Yes Adequate lighting in your home to reduce risk of falls?: Yes Life alert?: No Use of a cane, walker or w/c?: No Grab bars in the bathroom?:  Yes Shower chair or bench in shower?: No Elevated toilet seat or a handicapped toilet?: No  TIMED UP AND GO:  Was the test performed?  No    Cognitive Function:    03/22/2021    2:57 PM  MMSE - Mini Mental State Exam  Not completed: Unable to complete        05/08/2023    2:03 PM 04/11/2022    1:09 PM 03/22/2021    2:57 PM 04/10/2017   10:24 AM  6CIT Screen  What Year? 0 points 0 points 0 points 0 points  What month? 0 points 0 points 0 points 0 points  What time? 0 points 0 points 0 points 0 points  Count back from 20 0 points 0 points 0 points 0 points  Months in reverse 0 points 0 points 2 points 0 points  Repeat phrase 0 points 4 points 0 points 2 points  Total Score 0 points 4 points 2 points 2 points    Immunizations Immunization History  Administered Date(s) Administered   Fluad Quad(high Dose 65+) 03/14/2022   Influenza,inj,Quad PF,6+ Mos 03/12/2017, 03/10/2019, 04/11/2020, 02/28/2021   Moderna Sars-Covid-2 Vaccination 11/18/2019, 11/19/2019   Pneumococcal Conjugate-13 03/10/2019   Pneumococcal Polysaccharide-23 04/11/2020   Td 05/21/2009    TDAP status: Due, Education has been provided regarding the importance of this vaccine. Advised may receive this vaccine at local pharmacy or Health Dept. Aware to provide a copy of the vaccination record if obtained from local pharmacy or Health Dept. Verbalized acceptance and understanding.  Flu Vaccine status: Due, Education has been provided regarding the importance of this vaccine. Advised may receive this vaccine at local pharmacy or Health Dept. Aware to provide a copy of the vaccination record if obtained from local pharmacy or Health Dept. Verbalized acceptance and understanding.  Pneumococcal vaccine status: Up to date  Covid-19 vaccine status: Information provided on how to obtain vaccines.   Qualifies for Shingles Vaccine? Yes   Zostavax completed No   Shingrix Completed?: No.    Education has been provided  regarding the importance of this vaccine. Patient has been advised to call insurance company to determine out of pocket expense if they have not yet received this vaccine. Advised may also receive vaccine at local pharmacy or Health Dept. Verbalized acceptance and understanding.  Screening Tests Health Maintenance  Topic Date Due   Zoster Vaccines- Shingrix (1 of 2) Never done   DTaP/Tdap/Td (2 - Tdap) 05/22/2019   COVID-19 Vaccine (3 - Moderna risk series) 12/17/2019   OPHTHALMOLOGY EXAM  05/18/2022   INFLUENZA VACCINE  12/20/2022   Medicare Annual Wellness (AWV)  04/12/2023   Diabetic kidney evaluation - Urine ACR  05/18/2023   Colonoscopy  07/31/2023   HEMOGLOBIN A1C  06/21/2023   Diabetic kidney evaluation - eGFR measurement  12/19/2023   FOOT EXAM  01/07/2024   MAMMOGRAM  08/19/2024   Pneumonia Vaccine 37+ Years old (3 of 3 - PPSV23 or PCV20) 04/11/2025   DEXA SCAN  Completed   Hepatitis C Screening  Completed   HPV VACCINES  Aged Out    Health Maintenance  Health Maintenance Due  Topic Date Due   Zoster Vaccines-  Shingrix (1 of 2) Never done   DTaP/Tdap/Td (2 - Tdap) 05/22/2019   COVID-19 Vaccine (3 - Moderna risk series) 12/17/2019   OPHTHALMOLOGY EXAM  05/18/2022   INFLUENZA VACCINE  12/20/2022   Medicare Annual Wellness (AWV)  04/12/2023   Diabetic kidney evaluation - Urine ACR  05/18/2023   Colonoscopy  07/31/2023    Colorectal cancer screening: Referral to GI placed 05/08/2023. Pt aware the office will call re: appt.  Mammogram status: Completed 08/20/2022. Repeat every year  Bone Density status: Completed 08/28/2022. Results reflect: Bone density results: OSTEOPENIA. Repeat every 2 years.  Lung Cancer Screening: (Low Dose CT Chest recommended if Age 30-80 years, 20 pack-year currently smoking OR have quit w/in 15years.) does not qualify.   Lung Cancer Screening Referral: na  Additional Screening:  Hepatitis C Screening: does not qualify; Completed   Vision  Screening: Recommended annual ophthalmology exams for early detection of glaucoma and other disorders of the eye. Is the patient up to date with their annual eye exam?  Yes  Who is the provider or what is the name of the office in which the patient attends annual eye exams? [facility]  If pt is not established with a provider, would they like to be referred to a provider to establish care? No .   Dental Screening: Recommended annual dental exams for proper oral hygiene  Diabetic Foot Exam: Diabetic Foot Exam: Completed 01/07/2023  Community Resource Referral / Chronic Care Management: CRR required this visit?  No   CCM required this visit?  No     Plan:     I have personally reviewed and noted the following in the patient's chart:   Medical and social history Use of alcohol, tobacco or illicit drugs  Current medications and supplements including opioid prescriptions. Patient is not currently taking opioid prescriptions. Functional ability and status Nutritional status Physical activity Advanced directives List of other physicians Hospitalizations, surgeries, and ER visits in previous 12 months Vitals Screenings to include cognitive, depression, and falls Referrals and appointments  In addition, I have reviewed and discussed with patient certain preventive protocols, quality metrics, and best practice recommendations. A written personalized care plan for preventive services as well as general preventive health recommendations were provided to patient.     Jordan Hawks Dionysios Massman, CMA   05/08/2023   After Visit Summary: (Mail) Due to this being a telephonic visit, the after visit summary with patients personalized plan was offered to patient via mail   Nurse Notes: see routing comment

## 2023-05-18 DIAGNOSIS — J449 Chronic obstructive pulmonary disease, unspecified: Secondary | ICD-10-CM | POA: Diagnosis not present

## 2023-05-30 ENCOUNTER — Encounter: Payer: 59 | Admitting: Internal Medicine

## 2023-06-01 DIAGNOSIS — J22 Unspecified acute lower respiratory infection: Secondary | ICD-10-CM | POA: Diagnosis not present

## 2023-06-01 DIAGNOSIS — R030 Elevated blood-pressure reading, without diagnosis of hypertension: Secondary | ICD-10-CM | POA: Diagnosis not present

## 2023-06-01 DIAGNOSIS — Z681 Body mass index (BMI) 19 or less, adult: Secondary | ICD-10-CM | POA: Diagnosis not present

## 2023-06-03 ENCOUNTER — Other Ambulatory Visit: Payer: Self-pay | Admitting: Internal Medicine

## 2023-06-03 DIAGNOSIS — E1169 Type 2 diabetes mellitus with other specified complication: Secondary | ICD-10-CM

## 2023-06-07 ENCOUNTER — Other Ambulatory Visit: Payer: Self-pay

## 2023-06-07 ENCOUNTER — Ambulatory Visit
Admission: EM | Admit: 2023-06-07 | Discharge: 2023-06-07 | Disposition: A | Discharge status: Home / Self Care | Payer: 59 | Attending: Nurse Practitioner | Admitting: Nurse Practitioner

## 2023-06-07 ENCOUNTER — Encounter: Payer: Self-pay | Admitting: Emergency Medicine

## 2023-06-07 DIAGNOSIS — J22 Unspecified acute lower respiratory infection: Secondary | ICD-10-CM | POA: Diagnosis not present

## 2023-06-07 DIAGNOSIS — R059 Cough, unspecified: Secondary | ICD-10-CM | POA: Diagnosis not present

## 2023-06-07 DIAGNOSIS — Z8709 Personal history of other diseases of the respiratory system: Secondary | ICD-10-CM | POA: Diagnosis not present

## 2023-06-07 MED ORDER — PREDNISONE 20 MG PO TABS: 40.0000 mg | ORAL_TABLET | Freq: Every day | ORAL | 0 refills | Status: AC

## 2023-06-07 MED ORDER — ALBUTEROL SULFATE HFA 108 (90 BASE) MCG/ACT IN AERS: 2.0000 | INHALATION_SPRAY | Freq: Four times a day (QID) | RESPIRATORY_TRACT | 0 refills | Status: DC | PRN

## 2023-06-07 MED ORDER — AZITHROMYCIN 250 MG PO TABS: 250.0000 mg | ORAL_TABLET | Freq: Every day | ORAL | 0 refills | Status: DC

## 2023-06-07 MED ORDER — PROMETHAZINE-DM 6.25-15 MG/5ML PO SYRP: 5.0000 mL | ORAL_SOLUTION | Freq: Four times a day (QID) | ORAL | 0 refills | Status: DC | PRN

## 2023-06-07 NOTE — ED Provider Notes (Signed)
[facility]    CSN: [id number] Arrival date & time: 06/07/23  1634      History   Chief Complaint Chief Complaint  Patient presents with   Cough    HPI Jeanette Compton is a 68 y.o. female.   The history is provided by the patient.   Patient presents for complaints of cough, chest congestion, shortness of breath, and intermittent wheezing that is been present for the past 2 weeks.  Denies fever, chills, headache, ear pain, nasal congestion, runny nose, chest pain, abdominal pain, nausea, vomiting, diarrhea, or rash.  Patient reports that she does have a history of bronchitis.  Reports that she was seen at another urgent care approximately 1 week ago.  Patient was administered Decadron injection, and tested negative for COVID, flu, and RSV.  Patient was also given cough medication for her symptoms.  Most recent A1c was 6.8 in July 24.  Past Medical History:  Diagnosis Date   Arthritis    knee , ankle and back   Bronchitis    Cataract    Diabetes mellitus without complication (HCC) 02/10/2019   Hyperlipidemia    Hypertension    Hypothyroidism, adult 02/10/2019   Neuralgia, post-herpetic 11/13/2016   Obesity (BMI 30.0-34.9) 02/10/2019   Thyroid disease    Vitamin D deficiency disease 02/10/2019    Patient Active Problem List   Diagnosis Date Noted   Status post revision of total hip replacement 11/06/2021   Loosening of femoral component of prosthetic right hip (HCC) 10/24/2021   Status post arthroscopy of right knee 06/15/2021   Type 2 diabetes mellitus with obesity (HCC) 05/12/2021   Encounter for general adult medical examination with abnormal findings 06/09/2019   Hypothyroidism, adult 02/10/2019   Obesity (BMI 30.0-34.9) 02/10/2019   Vitamin D deficiency 02/10/2019   Rectal bleeding 07/29/2018   Status post total replacement of right hip 12/13/2017   Unilateral primary osteoarthritis, right hip 09/11/2017   Papilloma of right breast    Neuralgia,  post-herpetic 11/13/2016   History of colonic polyps 10/05/2016   HLD (hyperlipidemia) 09/10/2016   Essential hypertension 09/10/2016   Primary vitiligo 09/10/2016   Osteoarthritis of knee 09/10/2016   Chronic lower back pain 09/10/2016    Past Surgical History:  Procedure Laterality Date   ABDOMINAL HYSTERECTOMY     fibroid   ANTERIOR HIP REVISION Right 10/24/2021   Procedure: RIGHT ANTERIOR HIP REVISION;  Surgeon: Kathryne Hitch, MD;  Location: [facility];  Service: Orthopedics;  Laterality: Right;   BREAST BIOPSY Right 07/31/2017   lumpectomy for papilloma   BREAST SURGERY Right    COLONOSCOPY  09/13/2011   Procedure: COLONOSCOPY;  Surgeon: Malissa Hippo, MD;  Location: [facility];  Service: Endoscopy;  Laterality: N/A;  1030   COLONOSCOPY N/A 07/31/2018   Procedure: COLONOSCOPY;  Surgeon: Malissa Hippo, MD;  Location: [facility];  Service: Endoscopy;  Laterality: N/A;   POLYPECTOMY  07/31/2018   Procedure: POLYPECTOMY;  Surgeon: Malissa Hippo, MD;  Location: [facility];  Service: Endoscopy;;   TOTAL HIP ARTHROPLASTY Right 12/13/2017   Procedure: RIGHT TOTAL HIP ARTHROPLASTY ANTERIOR APPROACH;  Surgeon: Kathryne Hitch, MD;  Location: [facility];  Service: Orthopedics;  Laterality: Right;    OB History   No obstetric history on file.      Home Medications    Prior to Admission medications   Medication Sig Start Date End Date Taking? Authorizing Provider  amLODipine (NORVASC) 5 MG tablet TAKE  1 TABLET BY MOUTH DAILY 01/22/23   Anabel Halon, MD  Cholecalciferol (VITAMIN D3) 125 MCG (5000 UT) CAPS Take 2 capsules by mouth daily.     [provider]  levothyroxine (SYNTHROID) 75 MCG tablet TAKE 1 TABLET BY MOUTH DAILY 01/15/23   Anabel Halon, MD  losartan-hydrochlorothiazide (HYZAAR) 100-25 MG tablet Take 1 tablet by mouth daily. 01/07/23   Anabel Halon, MD  Multiple Vitamins-Minerals (HAIR SKIN AND NAILS FORMULA) TABS Take 2 tablets by  mouth daily.    [provider]  rosuvastatin (CRESTOR) 5 MG tablet TAKE 1 TABLET BY MOUTH DAILY 04/29/23   Anabel Halon, MD  RYBELSUS 7 MG TABS TAKE 1 TABLET BY MOUTH DAILY 06/04/23   Anabel Halon, MD    Family History Family History  Problem Relation Age of Onset   Stroke Mother    Diabetes Mother    Alcohol abuse Father    Early death Father    Early death Sister        pneumonia   Cancer Brother    Alzheimer's disease Sister    COPD Brother    Colon cancer Neg Hx     Social History Social History   Tobacco Use   Smoking status: Former    Current packs/day: 0.00    Average packs/day: 0.5 packs/day for 3.0 years (1.5 ttl pk-yrs)    Types: Cigarettes    Start date: 64    Quit date: 19    Years since quitting: 44.0   Smokeless tobacco: Never  Vaping Use   Vaping status: Never Used  Substance Use Topics   Alcohol use: No   Drug use: Yes    Types: Marijuana     Allergies   Lisinopril   Review of Systems Review of Systems Per HPI  Physical Exam Triage Vital Signs ED Triage Vitals  Encounter Vitals Group     BP 06/07/23 1711 (!) 159/81     Systolic BP Percentile --      Diastolic BP Percentile --      Pulse Rate 06/07/23 1711 80     Resp 06/07/23 1711 20     Temp 06/07/23 1711 98.3 F (36.8 C)     Temp Source 06/07/23 1711 Oral     SpO2 06/07/23 1711 96 %     Weight --      Height --      Head Circumference --      Peak Flow --      Pain Score 06/07/23 1713 9     Pain Loc --      Pain Education --      Exclude from Growth Chart --    No data found.  Updated Vital Signs BP (!) 159/81 (BP Location: Right Arm)   Pulse 80   Temp 98.3 F (36.8 C) (Oral)   Resp 20   SpO2 96%   Visual Acuity Right Eye Distance:   Left Eye Distance:   Bilateral Distance:    Right Eye Near:   Left Eye Near:    Bilateral Near:     Physical Exam Vitals and nursing note reviewed.  Constitutional:      General: She is not in acute  distress.    Appearance: Normal appearance.  HENT:     Head: Normocephalic.     Right Ear: Tympanic membrane, ear canal and external ear normal.     Left Ear: Tympanic membrane, ear canal and external ear normal.  Nose: Nose normal.     Mouth/Throat:     Mouth: Mucous membranes are moist.  Eyes:     Extraocular Movements: Extraocular movements intact.     Conjunctiva/sclera: Conjunctivae normal.     Pupils: Pupils are equal, round, and reactive to light.  Cardiovascular:     Rate and Rhythm: Normal rate and regular rhythm.     Pulses: Normal pulses.     Heart sounds: Normal heart sounds.  Pulmonary:     Effort: Pulmonary effort is normal. No respiratory distress.     Breath sounds: No stridor. Examination of the right-upper field reveals wheezing. Examination of the left-upper field reveals wheezing. Examination of the right-lower field reveals decreased breath sounds and wheezing. Examination of the left-lower field reveals decreased breath sounds and wheezing. Decreased breath sounds, wheezing and rhonchi present. No rales.     Comments: Faint expiratory wheezing noted. Rhonchi present, improves with coughing Abdominal:     General: Bowel sounds are normal.     Palpations: Abdomen is soft.     Tenderness: There is no abdominal tenderness.  Musculoskeletal:     Cervical back: Normal range of motion.  Lymphadenopathy:     Cervical: No cervical adenopathy.  Skin:    General: Skin is warm and dry.  Neurological:     General: No focal deficit present.     Mental Status: She is alert and oriented to person, place, and time.  Psychiatric:        Mood and Affect: Mood normal.        Behavior: Behavior normal.      UC Treatments / Results  Labs (all labs ordered are listed, but only abnormal results are displayed) Labs Reviewed - No data to display  EKG   Radiology No results found.  Procedures Procedures (including critical care time)  Medications Ordered in  UC Medications - No data to display  Initial Impression / Assessment and Plan / UC Course  I have reviewed the triage vital signs and the nursing notes.  Pertinent labs & imaging results that were available during my care of the patient were reviewed by me and considered in my medical decision making (see chart for details).  On exam, patient does have faint expiratory wheezing and rhonchi present.  Rhonchi does improve with cough.  Room air sats at 96%.  Given the duration of the patient's symptoms, and worsening cough with rhonchi and wheezing, will treat for lower respiratory infection with azithromycin 250 mg.  For the wheezing and shortness of breath, and albuterol inhaler was prescribed.  She was also prescribed prednisone 40 mg for bronchial inflammation, and Promethazine DM for her cough.  Supportive care recommendations were provided and discussed with the patient to include fluids, rest, over-the-counter Tylenol, sleeping elevated, and use of a humidifier at nighttime during sleep.  Discussed indications for follow-up with the patient.  Patient was advised if symptoms do not improve, recommend following up with her PCP for further evaluation.  Patient was also advised that since she is diabetic, and taking prednisone, if blood glucose levels exceed 400, she should stop the medication immediately.  Patient was in agreement with this plan of care and verbalizes understanding.  All questions were answered.  Patient is stable for discharge.   Final Clinical Impressions(s) / UC Diagnoses   Final diagnoses:  None   Discharge Instructions   None    ED Prescriptions   None    PDMP not reviewed this encounter.  Abran Cantor, NP 06/07/23 1814

## 2023-06-07 NOTE — ED Triage Notes (Signed)
Pt reports cough, chest congestion x2 weeks. Denies any known fevers.

## 2023-06-07 NOTE — Discharge Instructions (Addendum)
Take medication as prescribed.You have been prescribed Prednisone, this will elevate your blood glucose levels. If your blood glucose exceeds 400, stop the medication.  Increase fluids and allow for plenty of rest. Recommend Tylenol  as needed for pain, fever, or general discomfort. Recommend using a humidifier at bedtime during sleep and sleeping elevated on pillows while symptoms persist. As discussed if you continue to have a cough but are feeling generally well, continue OTC cough medication, fluids and cough drops until symptoms improve. If you experience worsening cough with fever, wheezing, SOB or other concerns, follow-up in this clinic or with your PCP for further evaluation. Follow-up as needed.

## 2023-06-18 DIAGNOSIS — J449 Chronic obstructive pulmonary disease, unspecified: Secondary | ICD-10-CM | POA: Diagnosis not present

## 2023-06-26 ENCOUNTER — Encounter: Payer: Self-pay | Admitting: Internal Medicine

## 2023-06-26 ENCOUNTER — Ambulatory Visit: Payer: 59 | Admitting: Internal Medicine

## 2023-06-26 VITALS — BP 133/85 | HR 89 | Ht 66.0 in | Wt 201.8 lb

## 2023-06-26 DIAGNOSIS — Z23 Encounter for immunization: Secondary | ICD-10-CM

## 2023-06-26 DIAGNOSIS — E559 Vitamin D deficiency, unspecified: Secondary | ICD-10-CM | POA: Diagnosis not present

## 2023-06-26 DIAGNOSIS — I10 Essential (primary) hypertension: Secondary | ICD-10-CM | POA: Diagnosis not present

## 2023-06-26 DIAGNOSIS — E039 Hypothyroidism, unspecified: Secondary | ICD-10-CM | POA: Diagnosis not present

## 2023-06-26 DIAGNOSIS — E1169 Type 2 diabetes mellitus with other specified complication: Secondary | ICD-10-CM | POA: Diagnosis not present

## 2023-06-26 DIAGNOSIS — Z0001 Encounter for general adult medical examination with abnormal findings: Secondary | ICD-10-CM | POA: Diagnosis not present

## 2023-06-26 DIAGNOSIS — E669 Obesity, unspecified: Secondary | ICD-10-CM

## 2023-06-26 DIAGNOSIS — Z7984 Long term (current) use of oral hypoglycemic drugs: Secondary | ICD-10-CM | POA: Diagnosis not present

## 2023-06-26 DIAGNOSIS — R052 Subacute cough: Secondary | ICD-10-CM

## 2023-06-26 DIAGNOSIS — E782 Mixed hyperlipidemia: Secondary | ICD-10-CM | POA: Diagnosis not present

## 2023-06-26 MED ORDER — MISC. DEVICES MISC: 0 refills | Status: AC

## 2023-06-26 NOTE — Progress Notes (Signed)
 Established Patient Office Visit  Subjective:  Patient ID: Jeanette Compton, female    DOB: 09-Dec-1955  Age: 68 y.o. MRN: [id number]  CC:  Chief Complaint  Patient presents with   Annual Exam    3 month f/u   Cough    Reports cough went to urgent care on 01/17 still having a lingering cough.    HPI Jeanette Compton is a 68 y.o. female with past medical history of HTN, type II DM, hypothyroidism, OA of hip and knee, vitiligo and obesity who presents for annual physical.  HTN: Her BP is wnl today.  She has been taking losartan  HCTZ 100-25 mg and amlodipine  5 mg daily.  She denies any headache, dizziness, chest pain, dyspnea or palpitations.  Type II DM: She has ben taking Rybelsus  for it. Her HbA1C was 6.8 in 07/24. She denies any fatigue, polyuria, polydipsia or polyphagia. She has lost about 12 lbs since starting Rybelsus .  She has been trying to improve her diet.  She has chronic right hip pain, which is aggravated with humidity.  She attributes elevated BP to hip pain.  She has had right THA revision surgery in 2023.  She had URTI about 3 weeks ago, went to urgent care and was given azithromycin , promethazine  DM syrup and albuterol  inhaler.  Her symptoms have resolved now except mild cough.  Denies any fever, chills, dyspnea or wheezing currently.   Past Medical History:  Diagnosis Date   Arthritis    knee , ankle and back   Bronchitis    Cataract    Diabetes mellitus without complication (HCC) 02/10/2019   Hyperlipidemia    Hypertension    Hypothyroidism, adult 02/10/2019   Neuralgia, post-herpetic 11/13/2016   Obesity (BMI 30.0-34.9) 02/10/2019   Thyroid  disease    Vitamin D  deficiency disease 02/10/2019    Past Surgical History:  Procedure Laterality Date   ABDOMINAL HYSTERECTOMY     fibroid   ANTERIOR HIP REVISION Right 10/24/2021   Procedure: RIGHT ANTERIOR HIP REVISION;  Surgeon: Vernetta Lonni GRADE, MD;  Location: [facility];  Service: Orthopedics;  Laterality: Right;    BREAST BIOPSY Right 07/31/2017   lumpectomy for papilloma   BREAST SURGERY Right    COLONOSCOPY  09/13/2011   Procedure: COLONOSCOPY;  Surgeon: Claudis RAYMOND Rivet, MD;  [facility];  Service: Endoscopy;  Laterality: N/A;  1030   COLONOSCOPY N/A 07/31/2018   Procedure: COLONOSCOPY;  Surgeon: Rivet Claudis RAYMOND, MD;  Location[facility];  Service: Endoscopy;  Laterality: N/A;   POLYPECTOMY  07/31/2018   Procedure: POLYPECTOMY;  Surgeon: Rivet Claudis RAYMOND, MD;  Location: [facility];  Service: Endoscopy;;   TOTAL HIP ARTHROPLASTY Right 12/13/2017   Procedure: RIGHT TOTAL HIP ARTHROPLASTY ANTERIOR APPROACH;  Surgeon: Vernetta Lonni GRADE, MD;  Location: [facility];  Service: Orthopedics;  Laterality: Right;    Family History  Problem Relation Age of Onset   Stroke Mother    Diabetes Mother    Alcohol abuse Father    Early death Father    Early death Sister        pneumonia   Cancer Brother    Alzheimer's disease Sister    COPD Brother    Colon cancer Neg Hx     Social History   Socioeconomic History   Marital status: Single    Spouse name: Not on file   Number of children: 3   Years of education: 10   Highest education level: Not on  file  Occupational History   Occupation: disability  Tobacco Use   Smoking status: Former    Current packs/day: 0.00    Average packs/day: 0.5 packs/day for 3.0 years (1.5 ttl pk-yrs)    Types: Cigarettes    Start date: 37    Quit date: 74    Years since quitting: 44.1   Smokeless tobacco: Never  Vaping Use   Vaping status: Never Used  Substance and Sexual Activity   Alcohol use: No   Drug use: Yes    Types: Marijuana   Sexual activity: Never    Birth control/protection: Surgical  Other Topics Concern   Not on file  Social History Narrative      Comments: Divorced x 3. Single. Has 3 children (2 boys and 1 girl), 9 grandchildren, 1 great-grandchild. Disability from arthritis in back. 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      Social Drivers of Corporate Investment Banker Strain: Low Risk  (05/08/2023)   Overall Financial Resource Strain (CARDIA)    Difficulty of Paying Living Expenses: Not hard at all  Food Insecurity: No Food Insecurity (05/08/2023)   Hunger Vital Sign    Worried About Running Out of Food in the Last Year: Never true    Ran Out of Food in the Last Year: Never true  Transportation Needs: No Transportation Needs (05/08/2023)   PRAPARE - Administrator, Civil Service (Medical): No    Lack of Transportation (Non-Medical): No  Physical Activity: Insufficiently Active (05/08/2023)   Exercise Vital Sign    Days of Exercise per Week: 3 days    Minutes of Exercise per Session: 20 min  Stress: No Stress Concern Present (05/08/2023)   [employer] of Occupational Health - Occupational Stress Questionnaire    Feeling of Stress : Not at all  Social Connections: Moderately Isolated (05/08/2023)   Social Connection and Isolation Panel [NHANES]    Frequency of Communication with Friends and Family: More than three times a week    Frequency of Social Gatherings with Friends and Family: Twice a week    Attends Religious Services: More than 4 times per year    Active Member of [employer] or Organizations: No    Attends Banker Meetings: Never    Marital Status: Divorced  Catering Manager Violence: Not At Risk (05/08/2023)   Humiliation, Afraid, Rape, and Kick questionnaire    Fear of Current or Ex-Partner: No    Emotionally Abused: No    Physically Abused: No    Sexually Abused: No    Outpatient Medications Prior to Visit  Medication Sig Dispense Refill   amLODipine  (NORVASC ) 5 MG tablet TAKE 1 TABLET BY MOUTH DAILY 100 tablet 2   Cholecalciferol (VITAMIN D3) 125 MCG (5000 UT) CAPS Take 2 capsules by mouth daily.      levothyroxine  (SYNTHROID ) 75 MCG tablet TAKE 1 TABLET BY MOUTH DAILY 100 tablet 2    losartan -hydrochlorothiazide  (HYZAAR) 100-25 MG tablet Take 1 tablet by mouth daily. 90 tablet 3   Multiple Vitamins-Minerals (HAIR SKIN AND NAILS FORMULA) TABS Take 2 tablets by mouth daily.     rosuvastatin  (CRESTOR ) 5 MG tablet TAKE 1 TABLET BY MOUTH DAILY 100 tablet 2   RYBELSUS  7 MG TABS TAKE 1 TABLET BY MOUTH DAILY 90 tablet 3   albuterol  (VENTOLIN  HFA) 108 (90 Base) MCG/ACT inhaler Inhale 2 puffs into the lungs every 6 (six) hours as needed.  8 g 0   azithromycin  (ZITHROMAX ) 250 MG tablet Take 1 tablet (250 mg total) by mouth daily. Take first 2 tablets together, then 1 every day until finished. 6 tablet 0   promethazine -dextromethorphan (PROMETHAZINE -DM) 6.25-15 MG/5ML syrup Take 5 mLs by mouth 4 (four) times daily as needed for cough. 118 mL 0   No facility-administered medications prior to visit.    Allergies  Allergen Reactions   Lisinopril Swelling and Other (See Comments)    Lips swelled    ROS Review of Systems  Constitutional:  Negative for chills and fever.  HENT:  Negative for congestion, sinus pressure, sinus pain and sore throat.   Eyes:  Negative for pain and discharge.  Respiratory:  Positive for cough. Negative for shortness of breath.   Cardiovascular:  Negative for chest pain and palpitations.  Gastrointestinal:  Negative for abdominal pain, constipation, diarrhea, nausea and vomiting.  Endocrine: Negative for polydipsia and polyuria.  Genitourinary:  Negative for dysuria and hematuria.  Musculoskeletal:  Positive for arthralgias (R hip and knee). Negative for neck pain and neck stiffness.  Skin:  Negative for rash.  Neurological:  Negative for dizziness and weakness.  Psychiatric/Behavioral:  Negative for agitation and behavioral problems.       Objective:    Physical Exam Vitals reviewed.  Constitutional:      General: She is not in acute distress.    Appearance: She is not diaphoretic.  HENT:     Head: Normocephalic and atraumatic.     Nose: Nose  normal. No congestion.     Mouth/Throat:     Mouth: Mucous membranes are moist.     Pharynx: No posterior oropharyngeal erythema.  Eyes:     General: No scleral icterus.    Extraocular Movements: Extraocular movements intact.  Cardiovascular:     Rate and Rhythm: Normal rate and regular rhythm.     Pulses: Normal pulses.     Heart sounds: Normal heart sounds. No murmur heard. Pulmonary:     Breath sounds: Normal breath sounds. No wheezing or rales.  Abdominal:     Palpations: Abdomen is soft.     Tenderness: There is no abdominal tenderness.  Musculoskeletal:     Cervical back: Neck supple. No tenderness.     Right lower leg: No edema.     Left lower leg: No edema.  Skin:    General: Skin is warm.     Findings: No rash.  Neurological:     General: No focal deficit present.     Mental Status: She is alert and oriented to person, place, and time.     Cranial Nerves: No cranial nerve deficit.     Sensory: No sensory deficit.     Motor: No weakness.  Psychiatric:        Mood and Affect: Mood normal.        Behavior: Behavior normal.     BP 133/85   Pulse 89   Ht 5' 6 (1.676 m)   Wt 201 lb 12.8 oz (91.5 kg)   SpO2 95%   BMI 32.57 kg/m  Wt Readings from Last 3 Encounters:  06/26/23 201 lb 12.8 oz (91.5 kg)  05/08/23 191 lb (86.6 kg)  01/07/23 207 lb 3.2 oz (94 kg)    Lab Results  Component Value Date   TSH 0.776 12/19/2022   Lab Results  Component Value Date   WBC 6.8 05/15/2022   HGB 11.7 05/15/2022   HCT 37.2 05/15/2022   MCV 80  05/15/2022   PLT 322 05/15/2022   Lab Results  Component Value Date   NA 141 12/19/2022   K 4.7 12/19/2022   CO2 24 12/19/2022   GLUCOSE 135 (H) 12/19/2022   BUN 15 12/19/2022   CREATININE 0.70 12/19/2022   BILITOT 0.3 12/19/2022   ALKPHOS 113 12/19/2022   AST 17 12/19/2022   ALT 15 12/19/2022   PROT 7.9 12/19/2022   ALBUMIN 4.4 12/19/2022   CALCIUM  10.5 (H) 12/19/2022   ANIONGAP 11 10/25/2021   EGFR 95 12/19/2022    Lab Results  Component Value Date   CHOL 166 05/15/2022   Lab Results  Component Value Date   HDL 63 05/15/2022   Lab Results  Component Value Date   LDLCALC 87 05/15/2022   Lab Results  Component Value Date   TRIG 87 05/15/2022   Lab Results  Component Value Date   CHOLHDL 2.6 05/15/2022   Lab Results  Component Value Date   HGBA1C 6.8 (H) 12/19/2022      Assessment & Plan:   Problem List Items Addressed This Visit       Cardiovascular and Mediastinum   Essential hypertension   BP Readings from Last 1 Encounters:  06/26/23 133/85   Well-controlled with Losartan -HCTZ 100-25 mg QD and Amlodipine  5 mg QD Counseled for compliance with the medications Advised DASH diet and moderate exercise/walking, at least 150 mins/week      Relevant Medications   Misc. Devices MISC   Other Relevant Orders   CMP14+EGFR   CBC with Differential/Platelet     Endocrine   Hypothyroidism, adult   Lab Results  Component Value Date   TSH 0.776 12/19/2022   On levothyroxine  75 mcg daily Check TSH and free T4      Relevant Orders   TSH + free T4   Type 2 diabetes mellitus with obesity (HCC)   Lab Results  Component Value Date   HGBA1C 6.8 (H) 12/19/2022   Well-controlled On Rybelsus  7 mg QD Advised to follow diabetic diet On ARB and statin F/u HbA1C, CMP and lipid panel Diabetic eye exam: Advised to follow up with Ophthalmology for diabetic eye exam      Relevant Orders   Microalbumin / creatinine urine ratio   Hemoglobin A1c   CMP14+EGFR     Other   HLD (hyperlipidemia)   Check lipid profile On Crestor       Relevant Orders   Lipid panel   Vitamin D  deficiency   Relevant Orders   VITAMIN D  25 Hydroxy (Vit-D Deficiency, Fractures)   Encounter for general adult medical examination with abnormal findings - Primary   Physical exam as documented. Counseling done  re healthy lifestyle involving commitment to 150 minutes exercise per week, heart healthy diet,  and attaining healthy weight.The importance of adequate sleep also discussed. Immunization and cancer screening needs are specifically addressed at this visit.      Subacute cough   From recent URTI Advised to continue Robitussin as needed for cough      Other Visit Diagnoses       Encounter for immunization       Relevant Orders   Flu Vaccine Trivalent High Dose (Fluad) (Completed)        Meds ordered this encounter  Medications   Misc. Devices MISC    Sig: Blood pressure cuff/device - 1. ICD10: I10    Dispense:  1 each    Refill:  0    Follow-up: Return in about 6 months (  around 12/24/2023) for HTN and DM.    Suzzane MARLA Blanch, MD

## 2023-06-26 NOTE — Patient Instructions (Addendum)
 Please continue to take medications as prescribed.  Please continue to follow low carb diet and perform moderate exercise/walking at least 150 mins/week.  Please consider getting Shingrix and Tdap vaccine at local pharmacy.

## 2023-06-26 NOTE — Assessment & Plan Note (Signed)
 Physical exam as documented. Counseling done  re healthy lifestyle involving commitment to 150 minutes exercise per week, heart healthy diet, and attaining healthy weight.The importance of adequate sleep also discussed. Immunization and cancer screening needs are specifically addressed at this visit.

## 2023-06-26 NOTE — Assessment & Plan Note (Signed)
Check lipid profile On Crestor

## 2023-06-26 NOTE — Assessment & Plan Note (Signed)
Lab Results  Component Value Date   HGBA1C 6.8 (H) 12/19/2022   Well-controlled On Rybelsus 7 mg QD Advised to follow diabetic diet On ARB and statin F/u HbA1C, CMP and lipid panel Diabetic eye exam: Advised to follow up with Ophthalmology for diabetic eye exam

## 2023-06-26 NOTE — Assessment & Plan Note (Signed)
 BP Readings from Last 1 Encounters:  06/26/23 133/85   Well-controlled with Losartan -HCTZ 100-25 mg QD and Amlodipine  5 mg QD Counseled for compliance with the medications Advised DASH diet and moderate exercise/walking, at least 150 mins/week

## 2023-06-26 NOTE — Assessment & Plan Note (Signed)
 From recent URTI Advised to continue Robitussin as needed for cough

## 2023-06-26 NOTE — Assessment & Plan Note (Addendum)
 Lab Results  Component Value Date   TSH 0.776 12/19/2022   On levothyroxine  75 mcg daily Check TSH and free T4

## 2023-06-27 ENCOUNTER — Telehealth: Payer: Self-pay | Admitting: Internal Medicine

## 2023-06-27 NOTE — Telephone Encounter (Signed)
 Copied from CRM [id number]. Topic: General - Other >> Jun 27, 2023  1:41 PM Deleta HERO wrote: Reason for CRM: PT spoke with [facility] about scheduling a colonoscopy. She states she will not need a referral, they are sending her a questionnaire to complete. She wanted to make sure Dr. Tobie was aware of the update.

## 2023-06-27 NOTE — Telephone Encounter (Signed)
 Spoke with pt , based on her hx she went to [facility], gave her the number to call. Advised if a referral is needed she can let us  know.

## 2023-06-27 NOTE — Telephone Encounter (Signed)
 Copied from CRM [id number]. Topic: Appointments - Scheduling Inquiry for Clinic >> Jun 27, 2023 12:03 PM Susanna ORN wrote: Reason for CRM: Patient states that Dr. Tobie told her to call the hospital to schedule colonoscopy. She states she called & they told her that it would have to be done in Dr. Anthony office. Pt states that she told them he doesn't do it in his office & she's always had them done at the hospital. Patient is wanting to let Dr. Tobie know they are telling her that but she does want to schedule her colonoscopy. Please contact pt. Cb #: [phone].

## 2023-06-28 LAB — MICROALBUMIN / CREATININE URINE RATIO
Creatinine, Urine: 85.8 mg/dL
Microalb/Creat Ratio: 4 mg/g{creat} (ref 0–29)
Microalbumin, Urine: 3.8 ug/mL

## 2023-06-28 LAB — TSH+FREE T4
Free T4: 1.22 ng/dL (ref 0.82–1.77)
TSH: 0.451 u[IU]/mL (ref 0.450–4.500)

## 2023-06-28 LAB — CBC WITH DIFFERENTIAL/PLATELET
Basophils Absolute: 0.1 10*3/uL (ref 0.0–0.2)
Basos: 1 %
EOS (ABSOLUTE): 0.3 10*3/uL (ref 0.0–0.4)
Eos: 4 %
Hematocrit: 39.5 % (ref 34.0–46.6)
Hemoglobin: 12.9 g/dL (ref 11.1–15.9)
Immature Grans (Abs): 0 10*3/uL (ref 0.0–0.1)
Immature Granulocytes: 0 %
Lymphocytes Absolute: 3.3 10*3/uL — ABNORMAL HIGH (ref 0.7–3.1)
Lymphs: 39 %
MCH: 26.7 pg (ref 26.6–33.0)
MCHC: 32.7 g/dL (ref 31.5–35.7)
MCV: 82 fL (ref 79–97)
Monocytes Absolute: 0.8 10*3/uL (ref 0.1–0.9)
Monocytes: 10 %
Neutrophils Absolute: 3.9 10*3/uL (ref 1.4–7.0)
Neutrophils: 46 %
Platelets: 336 10*3/uL (ref 150–450)
RBC: 4.83 x10E6/uL (ref 3.77–5.28)
RDW: 14 % (ref 11.7–15.4)
WBC: 8.3 10*3/uL (ref 3.4–10.8)

## 2023-06-28 LAB — LIPID PANEL
Chol/HDL Ratio: 2.7 {ratio} (ref 0.0–4.4)
Cholesterol, Total: 144 mg/dL (ref 100–199)
HDL: 54 mg/dL (ref >39)
LDL Chol Calc (NIH): 62 mg/dL (ref 0–99)
Triglycerides: 167 mg/dL — ABNORMAL HIGH (ref 0–149)
VLDL Cholesterol Cal: 28 mg/dL (ref 5–40)

## 2023-06-28 LAB — CMP14+EGFR
ALT: 15 [IU]/L (ref 0–32)
AST: 20 [IU]/L (ref 0–40)
Albumin: 4.6 g/dL (ref 3.9–4.9)
Alkaline Phosphatase: 101 [IU]/L (ref 44–121)
BUN/Creatinine Ratio: 20 (ref 12–28)
BUN: 16 mg/dL (ref 8–27)
Bilirubin Total: 0.3 mg/dL (ref 0.0–1.2)
CO2: 27 mmol/L (ref 20–29)
Calcium: 10 mg/dL (ref 8.7–10.3)
Chloride: 101 mmol/L (ref 96–106)
Creatinine, Ser: 0.79 mg/dL (ref 0.57–1.00)
Globulin, Total: 3.3 g/dL (ref 1.5–4.5)
Glucose: 105 mg/dL — ABNORMAL HIGH (ref 70–99)
Potassium: 4 mmol/L (ref 3.5–5.2)
Sodium: 143 mmol/L (ref 134–144)
Total Protein: 7.9 g/dL (ref 6.0–8.5)
eGFR: 82 mL/min/{1.73_m2} (ref >59)

## 2023-06-28 LAB — HEMOGLOBIN A1C
Est. average glucose Bld gHb Est-mCnc: 157 mg/dL
Hgb A1c MFr Bld: 7.1 % — ABNORMAL HIGH (ref 4.8–5.6)

## 2023-06-28 LAB — VITAMIN D 25 HYDROXY (VIT D DEFICIENCY, FRACTURES): Vit D, 25-Hydroxy: 135 ng/mL — ABNORMAL HIGH (ref 30.0–100.0)

## 2023-07-01 DIAGNOSIS — H9193 Unspecified hearing loss, bilateral: Secondary | ICD-10-CM | POA: Diagnosis not present

## 2023-07-01 DIAGNOSIS — G479 Sleep disorder, unspecified: Secondary | ICD-10-CM | POA: Diagnosis not present

## 2023-07-01 DIAGNOSIS — R413 Other amnesia: Secondary | ICD-10-CM | POA: Diagnosis not present

## 2023-07-01 DIAGNOSIS — I10 Essential (primary) hypertension: Secondary | ICD-10-CM | POA: Diagnosis not present

## 2023-07-01 DIAGNOSIS — J4521 Mild intermittent asthma with (acute) exacerbation: Secondary | ICD-10-CM | POA: Diagnosis not present

## 2023-07-01 DIAGNOSIS — J449 Chronic obstructive pulmonary disease, unspecified: Secondary | ICD-10-CM | POA: Diagnosis not present

## 2023-07-08 ENCOUNTER — Encounter (INDEPENDENT_AMBULATORY_CARE_PROVIDER_SITE_OTHER): Payer: Self-pay | Admitting: *Deleted

## 2023-07-11 ENCOUNTER — Other Ambulatory Visit (HOSPITAL_COMMUNITY): Payer: Self-pay | Admitting: Internal Medicine

## 2023-07-11 DIAGNOSIS — Z1231 Encounter for screening mammogram for malignant neoplasm of breast: Secondary | ICD-10-CM

## 2023-07-19 DIAGNOSIS — J449 Chronic obstructive pulmonary disease, unspecified: Secondary | ICD-10-CM | POA: Diagnosis not present

## 2023-07-22 DIAGNOSIS — R413 Other amnesia: Secondary | ICD-10-CM | POA: Diagnosis not present

## 2023-07-30 DIAGNOSIS — M4310 Spondylolisthesis, site unspecified: Secondary | ICD-10-CM | POA: Diagnosis not present

## 2023-07-30 DIAGNOSIS — J4522 Mild intermittent asthma with status asthmaticus: Secondary | ICD-10-CM | POA: Diagnosis not present

## 2023-07-30 DIAGNOSIS — I10 Essential (primary) hypertension: Secondary | ICD-10-CM | POA: Diagnosis not present

## 2023-08-01 ENCOUNTER — Telehealth (INDEPENDENT_AMBULATORY_CARE_PROVIDER_SITE_OTHER): Payer: Self-pay | Admitting: Gastroenterology

## 2023-08-01 DIAGNOSIS — Z860100 Personal history of colon polyps, unspecified: Secondary | ICD-10-CM

## 2023-08-01 NOTE — Telephone Encounter (Signed)
 Ok to schedule.  Room 1/2  Thanks,  Vista Lawman, MD Gastroenterology and Hepatology [facility] Gastroenterology

## 2023-08-01 NOTE — Telephone Encounter (Signed)
 Who is your primary care physician: Trena Platt  Reasons for the colonoscopy:   Have you had a colonoscopy before?  Yes 5 years ago  Do you have family history of colon cancer? no  Previous colonoscopy with polyps removed? Yes 5 years ago  Do you have a history colorectal cancer?   no  Are you diabetic? If yes, Type 1 or Type 2?    Yes type 2  Do you have a prosthetic or mechanical heart valve? no  Do you have a pacemaker/defibrillator?   no  Have you had endocarditis/atrial fibrillation? no  Have you had joint replacement within the last 12 months?  no  Do you tend to be constipated or have to use laxatives? no  Do you have any history of drugs or alchohol?  no  Do you use supplemental oxygen?  no  Have you had a stroke or heart attack within the last 6 months? no  Do you take weight loss medication?  no  For female patients: have you had a hysterectomy?  yes                                     are you post menopausal?       no                                            do you still have your menstrual cycle? no      Do you take any blood-thinning medications such as: (aspirin, warfarin, Plavix, Aggrenox)  no  If yes we need the name, milligram, dosage and who is prescribing doctor  Current Outpatient Medications on File Prior to Visit  Medication Sig Dispense Refill   amLODipine (NORVASC) 5 MG tablet TAKE 1 TABLET BY MOUTH DAILY 100 tablet 2   Cholecalciferol (VITAMIN D3) 125 MCG (5000 UT) CAPS Take 2 capsules by mouth daily.      levothyroxine (SYNTHROID) 75 MCG tablet TAKE 1 TABLET BY MOUTH DAILY 100 tablet 2   losartan-hydrochlorothiazide (HYZAAR) 100-25 MG tablet Take 1 tablet by mouth daily. 90 tablet 3   Misc. Devices MISC Blood pressure cuff/device - 1. ICD10: I10 1 each 0   Multiple Vitamins-Minerals (HAIR SKIN AND NAILS FORMULA) TABS Take 2 tablets by mouth daily.     rosuvastatin (CRESTOR) 5 MG tablet TAKE 1 TABLET BY MOUTH DAILY 100 tablet 2   RYBELSUS 7  MG TABS TAKE 1 TABLET BY MOUTH DAILY 90 tablet 3   No current facility-administered medications on file prior to visit.    Allergies  Allergen Reactions   Lisinopril Swelling and Other (See Comments)    Lips swelled     Pharmacy: [facility]   Primary Insurance Name: United [employer]/Medicaid  Best number where you can be reached: [phone]

## 2023-08-05 MED ORDER — PEG 3350-KCL-NA BICARB-NACL 420 G PO SOLR: 4000.0000 mL | Freq: Once | ORAL | 0 refills | Status: AC

## 2023-08-05 NOTE — Addendum Note (Signed)
 Addended by: Marlowe Shores on: 08/05/2023 12:40 PM   Modules accepted: Orders

## 2023-08-05 NOTE — Telephone Encounter (Signed)
 Pt contacted and scheduled. Will mail instructions. Prep sent to pharmacy. No pa needed per insurance.

## 2023-08-16 DIAGNOSIS — J449 Chronic obstructive pulmonary disease, unspecified: Secondary | ICD-10-CM | POA: Diagnosis not present

## 2023-08-17 DIAGNOSIS — J40 Bronchitis, not specified as acute or chronic: Secondary | ICD-10-CM | POA: Diagnosis not present

## 2023-08-22 ENCOUNTER — Other Ambulatory Visit (HOSPITAL_COMMUNITY)
Admission: RE | Admit: 2023-08-22 | Discharge: 2023-08-22 | Disposition: A | Discharge status: Home / Self Care | Source: Ambulatory Visit | Attending: Gastroenterology | Admitting: Gastroenterology

## 2023-08-22 DIAGNOSIS — Z09 Encounter for follow-up examination after completed treatment for conditions other than malignant neoplasm: Secondary | ICD-10-CM | POA: Insufficient documentation

## 2023-08-22 DIAGNOSIS — Z860100 Personal history of colon polyps, unspecified: Secondary | ICD-10-CM | POA: Insufficient documentation

## 2023-08-22 LAB — BASIC METABOLIC PANEL WITH GFR
Anion gap: 11 (ref 5–15)
BUN: 14 mg/dL (ref 8–23)
CO2: 25 mmol/L (ref 22–32)
Calcium: 9.9 mg/dL (ref 8.9–10.3)
Chloride: 101 mmol/L (ref 98–111)
Creatinine, Ser: 0.78 mg/dL (ref 0.44–1.00)
GFR, Estimated: >60 mL/min (ref >60)
Glucose, Bld: 168 mg/dL — ABNORMAL HIGH (ref 70–99)
Potassium: 3.6 mmol/L (ref 3.5–5.1)
Sodium: 137 mmol/L (ref 135–145)

## 2023-08-26 ENCOUNTER — Encounter (HOSPITAL_COMMUNITY): Payer: Self-pay | Admitting: Gastroenterology

## 2023-08-26 ENCOUNTER — Ambulatory Visit (HOSPITAL_COMMUNITY): Payer: 59

## 2023-08-26 ENCOUNTER — Ambulatory Visit (HOSPITAL_COMMUNITY): Admitting: Anesthesiology

## 2023-08-26 ENCOUNTER — Encounter (HOSPITAL_COMMUNITY)
Admission: RE | Disposition: A | Discharge status: Home / Self Care | Payer: Self-pay | Source: Home / Self Care | Attending: Gastroenterology

## 2023-08-26 ENCOUNTER — Ambulatory Visit (HOSPITAL_COMMUNITY)
Admission: RE | Admit: 2023-08-26 | Discharge: 2023-08-26 | Disposition: A | Discharge status: Home / Self Care | Attending: Gastroenterology | Admitting: Gastroenterology

## 2023-08-26 ENCOUNTER — Other Ambulatory Visit: Payer: Self-pay

## 2023-08-26 DIAGNOSIS — I10 Essential (primary) hypertension: Secondary | ICD-10-CM | POA: Insufficient documentation

## 2023-08-26 DIAGNOSIS — D121 Benign neoplasm of appendix: Secondary | ICD-10-CM | POA: Diagnosis not present

## 2023-08-26 DIAGNOSIS — E039 Hypothyroidism, unspecified: Secondary | ICD-10-CM | POA: Diagnosis not present

## 2023-08-26 DIAGNOSIS — Z860101 Personal history of adenomatous and serrated colon polyps: Secondary | ICD-10-CM

## 2023-08-26 DIAGNOSIS — D120 Benign neoplasm of cecum: Secondary | ICD-10-CM | POA: Diagnosis not present

## 2023-08-26 DIAGNOSIS — Z1211 Encounter for screening for malignant neoplasm of colon: Secondary | ICD-10-CM | POA: Diagnosis not present

## 2023-08-26 DIAGNOSIS — Z87891 Personal history of nicotine dependence: Secondary | ICD-10-CM | POA: Diagnosis not present

## 2023-08-26 DIAGNOSIS — E119 Type 2 diabetes mellitus without complications: Secondary | ICD-10-CM | POA: Diagnosis not present

## 2023-08-26 DIAGNOSIS — K648 Other hemorrhoids: Secondary | ICD-10-CM

## 2023-08-26 DIAGNOSIS — K5730 Diverticulosis of large intestine without perforation or abscess without bleeding: Secondary | ICD-10-CM

## 2023-08-26 DIAGNOSIS — Z833 Family history of diabetes mellitus: Secondary | ICD-10-CM | POA: Diagnosis not present

## 2023-08-26 DIAGNOSIS — K6289 Other specified diseases of anus and rectum: Secondary | ICD-10-CM

## 2023-08-26 DIAGNOSIS — K635 Polyp of colon: Secondary | ICD-10-CM | POA: Diagnosis not present

## 2023-08-26 DIAGNOSIS — Z79890 Hormone replacement therapy (postmenopausal): Secondary | ICD-10-CM | POA: Insufficient documentation

## 2023-08-26 DIAGNOSIS — Z79899 Other long term (current) drug therapy: Secondary | ICD-10-CM | POA: Insufficient documentation

## 2023-08-26 HISTORY — PX: COLONOSCOPY: SHX5424

## 2023-08-26 LAB — GLUCOSE, CAPILLARY: Glucose-Capillary: 112 mg/dL — ABNORMAL HIGH (ref 70–99)

## 2023-08-26 SURGERY — COLONOSCOPY
Anesthesia: General

## 2023-08-26 MED ORDER — PROPOFOL 10 MG/ML IV BOLUS
INTRAVENOUS | Status: DC | PRN
  Administered 2023-08-26: 100 mg via INTRAVENOUS

## 2023-08-26 MED ORDER — LACTATED RINGERS IV SOLN
INTRAVENOUS | Status: DC | PRN
Start: 2023-08-26 — End: 2023-08-26

## 2023-08-26 MED ORDER — EPHEDRINE SULFATE-NACL 50-0.9 MG/10ML-% IV SOSY
PREFILLED_SYRINGE | INTRAVENOUS | Status: DC | PRN
  Administered 2023-08-26: 10 mg via INTRAVENOUS

## 2023-08-26 MED ORDER — PROPOFOL 500 MG/50ML IV EMUL
INTRAVENOUS | Status: DC | PRN
  Administered 2023-08-26: 200 ug/kg/min via INTRAVENOUS

## 2023-08-26 NOTE — Op Note (Signed)
 [facility] Patient Name: Jeanette Compton Procedure Date: 08/26/2023 9:12 AM MRN: [id number] Date of Birth: 1955-08-24 Attending MD: Sanjuan Dame , MD, [id number] CSN: [id number] Age: 68 Admit Type: Outpatient Procedure:                Colonoscopy Indications:              High risk colon cancer surveillance: Personal                            history of colonic polyps, High risk colon cancer                            surveillance: Personal history of adenoma (10 mm or                            greater in size) Providers:                Sanjuan Dame, MD, Angelica Ran, Dyann Ruddle Referring MD:              Medicines:                Monitored Anesthesia Care Complications:            No immediate complications. Estimated Blood Loss:     Estimated blood loss: none. Procedure:                Pre-Anesthesia Assessment:                           - Prior to the procedure, a History and Physical                            was performed, and patient medications and                            allergies were reviewed. The patient's tolerance of                            previous anesthesia was also reviewed. The risks                            and benefits of the procedure and the sedation                            options and risks were discussed with the patient.                            All questions were answered, and informed consent                            was obtained. Prior Anticoagulants: The patient has                            taken no anticoagulant or antiplatelet agents. ASA  Grade Assessment: II - A patient with mild systemic                            disease. After reviewing the risks and benefits,                            the patient was deemed in satisfactory condition to                            undergo the procedure.                           After obtaining informed consent, the colonoscope                            was  passed under direct vision. Throughout the                            procedure, the patient's blood pressure, pulse, and                            oxygen saturations were monitored continuously. The                            [id number]-[id number] ([id number]) scope was introduced through                            the anus and advanced to the the cecum, identified                            by appendiceal orifice and ileocecal valve. The                            colonoscopy was performed without difficulty. The                            patient tolerated the procedure well. The quality                            of the bowel preparation was evaluated using the                            BBPS [facility] Bowel Preparation Scale) with scores                            of: Right Colon = 3, Transverse Colon = 3 and Left                            Colon = 3 (entire mucosa seen well with no residual                            staining, small fragments of stool or opaque  liquid). The total BBPS score equals 9. The                            ileocecal valve, appendiceal orifice, and rectum                            were photographed. Scope In: 9:29:15 AM Scope Out: 9:48:34 AM Scope Withdrawal Time: 0 hours 16 minutes 14 seconds  Total Procedure Duration: 0 hours 19 minutes 19 seconds  Findings:      A 6 mm polyp was found in the cecum. The polyp was sessile. The polyp       was removed with a cold snare. Resection and retrieval were complete.      A 2 mm polyp was found in the appendiceal orifice. The polyp was       sessile. The polyp was removed with a cold biopsy forceps. Resection and       retrieval were complete.      A few small-mouthed diverticula were found in the left colon.      Non-bleeding internal hemorrhoids were found during retroflexion. The       hemorrhoids were small.      Anal papilla(e) were hypertrophied. Impression:               - One 6 mm polyp in the  cecum, removed with a cold                            snare. Resected and retrieved.                           - One 2 mm polyp at the appendiceal orifice,                            removed with a cold biopsy forceps. Resected and                            retrieved.                           - Diverticulosis in the left colon.                           - Non-bleeding internal hemorrhoids.                           - Anal papilla(e) were hypertrophied. Moderate Sedation:      Per Anesthesia Care Recommendation:           - Patient has a contact number available for                            emergencies. The signs and symptoms of potential                            delayed complications were discussed with the  patient. Return to normal activities tomorrow.                            Written discharge instructions were provided to the                            patient.                           - Resume previous diet.                           - Continue present medications.                           - Await pathology results.                           - Repeat colonoscopy in 5 years for surveillance.                           - Return to primary care physician as previously                            scheduled. Procedure Code(s):        --- Professional ---                           [id number], Colonoscopy, flexible; with removal of                            tumor(s), polyp(s), or other lesion(s) by snare                            technique                           45380, 59, Colonoscopy, flexible; with biopsy,                            single or multiple Diagnosis Code(s):        --- Professional ---                           D12.0, Benign neoplasm of cecum                           D12.1, Benign neoplasm of appendix                           K64.8, Other hemorrhoids                           K62.89, Other specified diseases of anus and rectum                            Z86.010, Personal history of colonic polyps  K57.30, Diverticulosis of large intestine without                            perforation or abscess without bleeding CPT copyright 2022 American Medical Association. All rights reserved. The codes documented in this report are preliminary and upon coder review may  be revised to meet current compliance requirements. Sanjuan Dame, MD Sanjuan Dame, MD 08/26/2023 9:59:24 AM This report has been signed electronically. Number of Addenda: 0

## 2023-08-26 NOTE — Discharge Instructions (Signed)

## 2023-08-26 NOTE — H&P (Signed)
 Primary Care Physician:  Anabel Halon, MD Primary Gastroenterologist:  Dr. Tasia Catchings  Pre-Procedure History & Physical: HPI:  Jeanette Compton is a 68 y.o. female is here for a colonoscopy for surveillance given history of polyps .  Patient denies any family history of colorectal cancer.  No melena or hematochezia.  No abdominal pain or unintentional weight loss.  No change in bowel habits.  Overall feels well from a GI standpoint.  2020 colonoscopy  - One 5 mm polyp in the proximal transverse colon, removed with a cold snare. Resected and retrieved. - One small polyp at the recto- sigmoid colon. Biopsied. - One 10 to 16 mm polyp in the distal sigmoid colon, removed with a hot snare. Resected and retrieved. - Diverticulosis in the sigmoid colon and at the hepatic flexure. - Internal hemorrhoids. - Small Anal papilla.  Past Medical History:  Diagnosis Date   Arthritis    knee , ankle and back   Bronchitis    Cataract    Diabetes mellitus without complication (HCC) 02/10/2019   Hyperlipidemia    Hypertension    Hypothyroidism, adult 02/10/2019   Neuralgia, post-herpetic 11/13/2016   Obesity (BMI 30.0-34.9) 02/10/2019   Thyroid disease    Vitamin D deficiency disease 02/10/2019    Past Surgical History:  Procedure Laterality Date   ABDOMINAL HYSTERECTOMY     fibroid   ANTERIOR HIP REVISION Right 10/24/2021   Procedure: RIGHT ANTERIOR HIP REVISION;  Surgeon: Kathryne Hitch, MD;  Location: [facility] OR;  Service: Orthopedics;  Laterality: Right;   BREAST BIOPSY Right 07/31/2017   lumpectomy for papilloma   BREAST SURGERY Right    COLONOSCOPY  09/13/2011   Procedure: COLONOSCOPY;  Surgeon: Malissa Hippo, MD;  Location: [facility];  Service: Endoscopy;  Laterality: N/A;  1030   COLONOSCOPY N/A 07/31/2018   Procedure: COLONOSCOPY;  Surgeon: Malissa Hippo, MD;  Location: [facility];  Service: Endoscopy;  Laterality: N/A;   POLYPECTOMY  07/31/2018   Procedure: POLYPECTOMY;   Surgeon: Malissa Hippo, MD;  Location: [facility];  Service: Endoscopy;;   TOTAL HIP ARTHROPLASTY Right 12/13/2017   Procedure: RIGHT TOTAL HIP ARTHROPLASTY ANTERIOR APPROACH;  Surgeon: Kathryne Hitch, MD;  Location: [facility];  Service: Orthopedics;  Laterality: Right;    Prior to Admission medications   Medication Sig Start Date End Date Taking? Authorizing Provider  amLODipine (NORVASC) 5 MG tablet TAKE 1 TABLET BY MOUTH DAILY 01/22/23  Yes Anabel Halon, MD  Cholecalciferol (VITAMIN D3) 125 MCG (5000 UT) CAPS Take 2 capsules by mouth daily.    Yes [provider]  levothyroxine (SYNTHROID) 75 MCG tablet TAKE 1 TABLET BY MOUTH DAILY 01/15/23  Yes Anabel Halon, MD  losartan-hydrochlorothiazide (HYZAAR) 100-25 MG tablet Take 1 tablet by mouth daily. 01/07/23  Yes Anabel Halon, MD  Multiple Vitamins-Minerals (HAIR SKIN AND NAILS FORMULA) TABS Take 2 tablets by mouth daily.   Yes [provider]  rosuvastatin (CRESTOR) 5 MG tablet TAKE 1 TABLET BY MOUTH DAILY 04/29/23  Yes Anabel Halon, MD  RYBELSUS 7 MG TABS TAKE 1 TABLET BY MOUTH DAILY 06/04/23  Yes Anabel Halon, MD  Misc. Devices MISC Blood pressure cuff/device - 1. ICD10: I10 06/26/23   Anabel Halon, MD    Allergies as of 08/05/2023 - Review Complete 08/01/2023  Allergen Reaction Noted   Lisinopril Swelling and Other (See Comments) 03/12/2017    Family History  Problem Relation Age of Onset  Stroke Mother    Diabetes Mother    Alcohol abuse Father    Early death Father    Early death Sister        pneumonia   Cancer Brother    Alzheimer's disease Sister    COPD Brother    Colon cancer Neg Hx     Social History   Socioeconomic History   Marital status: Single    Spouse name: Not on file   Number of children: 3   Years of education: 10   Highest education level: Not on file  Occupational History   Occupation: disability  Tobacco Use   Smoking status: Former    Current packs/day:  0.00    Average packs/day: 0.5 packs/day for 3.0 years (1.5 ttl pk-yrs)    Types: Cigarettes    Start date: 54    Quit date: 1981    Years since quitting: 44.2   Smokeless tobacco: Never  Vaping Use   Vaping status: Never Used  Substance and Sexual Activity   Alcohol use: No   Drug use: Yes    Types: Marijuana   Sexual activity: Never    Birth control/protection: Surgical  Other Topics Concern   Not on file  Social History Narrative      Comments: Divorced x 3. Single. Has 3 children (2 boys and 1 girl), 9 grandchildren, 1 great-grandchild. Disability from arthritis in back. 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      Social Drivers of Corporate investment banker Strain: Low Risk  (05/08/2023)   Overall Financial Resource Strain (CARDIA)    Difficulty of Paying Living Expenses: Not hard at all  Food Insecurity: No Food Insecurity (05/08/2023)   Hunger Vital Sign    Worried About Running Out of Food in the Last Year: Never true    Ran Out of Food in the Last Year: Never true  Transportation Needs: No Transportation Needs (05/08/2023)   PRAPARE - Administrator, Civil Service (Medical): No    Lack of Transportation (Non-Medical): No  Physical Activity: Insufficiently Active (05/08/2023)   Exercise Vital Sign    Days of Exercise per Week: 3 days    Minutes of Exercise per Session: 20 min  Stress: No Stress Concern Present (05/08/2023)   [employer] of Occupational Health - Occupational Stress Questionnaire    Feeling of Stress : Not at all  Social Connections: Moderately Isolated (05/08/2023)   Social Connection and Isolation Panel [NHANES]    Frequency of Communication with Friends and Family: More than three times a week    Frequency of Social Gatherings with Friends and Family: Twice a week    Attends Religious Services: More than 4 times per year    Active Member of [employer] or Organizations: No     Attends Banker Meetings: Never    Marital Status: Divorced  Catering manager Violence: Not At Risk (05/08/2023)   Humiliation, Afraid, Rape, and Kick questionnaire    Fear of Current or Ex-Partner: No    Emotionally Abused: No    Physically Abused: No    Sexually Abused: No    Review of Systems: See HPI, otherwise negative ROS  Physical Exam: Vital signs in last 24 hours: Temp:  [98.6 F (37 C)] 98.6 F (37 C) (04/07 0831) Pulse Rate:  [90] 90 (04/07 0831) Resp:  [20] 20 (04/07 0831) BP: (159)/(73) 159/73 (04/07 0831) SpO2:  [95 %]  95 % (04/07 0831) Weight:  [87.1 kg] 87.1 kg (04/07 0831)   General:   Alert,  Well-developed, well-nourished, pleasant and cooperative in NAD Head:  Normocephalic and atraumatic. Eyes:  Sclera clear, no icterus.   Conjunctiva pink. Ears:  Normal auditory acuity. Nose:  No deformity, discharge,  or lesions. Msk:  Symmetrical without gross deformities. Normal posture. Extremities:  Without clubbing or edema. Neurologic:  Alert and  oriented x4;  grossly normal neurologically. Skin:  Intact without significant lesions or rashes. Psych:  Alert and cooperative. Normal mood and affect.  Impression/Plan: Jeanette Compton is a 68 y.o. female is here for a colonoscopy for surveillance given history of polyps .  The risks of the procedure including infection, bleed, or perforation as well as benefits, limitations, alternatives and imponderables have been reviewed with the patient. Questions have been answered. All parties agreeable.

## 2023-08-26 NOTE — Anesthesia Postprocedure Evaluation (Signed)
 Anesthesia Post Note  Patient: Jeanette Compton  Procedure(s) Performed: COLONOSCOPY  Patient location during evaluation: PACU Anesthesia Type: General Level of consciousness: awake and alert Pain management: pain level controlled Vital Signs Assessment: post-procedure vital signs reviewed and stable Respiratory status: spontaneous breathing, nonlabored ventilation, respiratory function stable and patient connected to nasal cannula oxygen Cardiovascular status: blood pressure returned to baseline and stable Postop Assessment: no apparent nausea or vomiting Anesthetic complications: no   There were no known notable events for this encounter.   Last Vitals:  Vitals:   08/26/23 0952 08/26/23 0955  BP: (!) 98/50 124/71  Pulse: 76 78  Resp: 18 18  Temp: 36.4 C   SpO2: 97% 98%    Last Pain:  Vitals:   08/26/23 1008  TempSrc:   PainSc: 8                  Micky Sheller L Sharrell Krawiec

## 2023-08-26 NOTE — Transfer of Care (Signed)
 Immediate Anesthesia Transfer of Care Note  Patient: Jeanette Compton  Procedure(s) Performed: COLONOSCOPY  Patient Location: Short Stay  Anesthesia Type:General  Level of Consciousness: awake, alert , oriented, and patient cooperative  Airway & Oxygen Therapy: Patient Spontanous Breathing  Post-op Assessment: Report given to RN, Post -op Vital signs reviewed and stable, and Patient moving all extremities X 4  Post vital signs: Reviewed and stable  Last Vitals:  Vitals Value Taken Time  BP 124/71 08/26/23 0955  Temp 36.4 C 08/26/23 0952  Pulse 78 08/26/23 0955  Resp 18 08/26/23 0955  SpO2 98 % 08/26/23 0955    Last Pain:  Vitals:   08/26/23 0952  TempSrc: Oral  PainSc:       Patients Stated Pain Goal: 5 (08/26/23 0831)  Complications: No notable events documented.

## 2023-08-26 NOTE — Anesthesia Preprocedure Evaluation (Addendum)
 Anesthesia Evaluation  Patient identified by MRN, date of birth, ID band Patient awake    Reviewed: Allergy & Precautions, H&P , NPO status , Patient's Chart, lab work & pertinent test results, reviewed documented beta blocker date and time   Airway Mallampati: II  TM Distance: >3 FB Neck ROM: full    Dental  (+) Upper Dentures, Lower Dentures   Pulmonary former smoker History bronchitis   Pulmonary exam normal breath sounds clear to auscultation       Cardiovascular Exercise Tolerance: Good hypertension, Normal cardiovascular exam Rhythm:regular Rate:Normal     Neuro/Psych negative neurological ROS  negative psych ROS   GI/Hepatic negative GI ROS, Neg liver ROS,,,  Endo/Other  diabetes, Type 2Hypothyroidism    Renal/GU negative Renal ROS  negative genitourinary   Musculoskeletal   Abdominal   Peds  Hematology negative hematology ROS (+)   Anesthesia Other Findings   Reproductive/Obstetrics negative OB ROS                             Anesthesia Physical Anesthesia Plan  ASA: 2  Anesthesia Plan: General   Post-op Pain Management: Minimal or no pain anticipated   Induction: Intravenous  PONV Risk Score and Plan: Propofol infusion  Airway Management Planned: Nasal Cannula and Natural Airway  Additional Equipment: None  Intra-op Plan:   Post-operative Plan:   Informed Consent: I have reviewed the patients History and Physical, chart, labs and discussed the procedure including the risks, benefits and alternatives for the proposed anesthesia with the patient or authorized representative who has indicated his/her understanding and acceptance.     Dental Advisory Given  Plan Discussed with: CRNA  Anesthesia Plan Comments:        Anesthesia Quick Evaluation

## 2023-08-26 NOTE — Addendum Note (Signed)
 Addendum  created 08/26/23 1231 by Shanon Payor, CRNA   Review and Sign - Signed

## 2023-08-27 ENCOUNTER — Encounter (HOSPITAL_COMMUNITY): Payer: Self-pay | Admitting: Gastroenterology

## 2023-08-27 LAB — SURGICAL PATHOLOGY

## 2023-08-29 NOTE — Progress Notes (Signed)
 I reviewed the pathology results. Ann, can you send her a letter with the findings as described below please?  Repeat colonoscopy in 5 years  Thanks,  Vista Lawman, MD Gastroenterology and Hepatology [facility] Gastroenterology  ---------------------------------------------------------------------------------------------  [facility] [address], [place], [place] [address] Phone:  [phone]   08/29/23 Jeanette Compton, [place]   Dear Jeanette Compton,  I am writing to inform you that the biopsies taken during your recent endoscopic examination showed:   A. COLON, APPENDICEAL ORIFICE/CECUM, POLYPECTOMY:       Fragments of tubular adenoma.       Negative for high-grade dysplasia.    What does this mean?   You had a total of 2 polyps removed. The pathology came back as "tubular adenoma." These findings are NOT cancer, but had the polyps remained in your colon, they could have turned into cancer.  Given these findings, it is recommended that your next colonoscopy be performed in 5 years as you previously had a large polyp .  Also I value your feedback , so if you get a survey , please take the time to fill it out and thank you for choosing [facility]  Please call us at [phone] if you have persistent problems or have questions about your condition that have not been fully answered at this time.  Sincerely,  Vista Lawman, MD Gastroenterology and Hepatology

## 2023-08-30 ENCOUNTER — Encounter (INDEPENDENT_AMBULATORY_CARE_PROVIDER_SITE_OTHER): Payer: Self-pay | Admitting: *Deleted

## 2023-09-07 ENCOUNTER — Other Ambulatory Visit: Payer: Self-pay | Admitting: Internal Medicine

## 2023-09-07 DIAGNOSIS — I10 Essential (primary) hypertension: Secondary | ICD-10-CM

## 2023-09-16 DIAGNOSIS — J449 Chronic obstructive pulmonary disease, unspecified: Secondary | ICD-10-CM | POA: Diagnosis not present

## 2023-09-17 DIAGNOSIS — R062 Wheezing: Secondary | ICD-10-CM | POA: Diagnosis not present

## 2023-09-29 ENCOUNTER — Other Ambulatory Visit: Payer: Self-pay | Admitting: Internal Medicine

## 2023-09-29 DIAGNOSIS — I10 Essential (primary) hypertension: Secondary | ICD-10-CM

## 2023-09-29 DIAGNOSIS — E039 Hypothyroidism, unspecified: Secondary | ICD-10-CM

## 2023-10-09 ENCOUNTER — Ambulatory Visit (HOSPITAL_COMMUNITY)

## 2023-10-10 ENCOUNTER — Inpatient Hospital Stay (HOSPITAL_COMMUNITY): Admission: RE | Admit: 2023-10-10 | Source: Ambulatory Visit

## 2023-10-16 DIAGNOSIS — J449 Chronic obstructive pulmonary disease, unspecified: Secondary | ICD-10-CM | POA: Diagnosis not present

## 2023-10-21 ENCOUNTER — Ambulatory Visit (HOSPITAL_COMMUNITY)
Admission: RE | Admit: 2023-10-21 | Discharge: 2023-10-21 | Disposition: A | Discharge status: Home / Self Care | Source: Ambulatory Visit | Attending: Internal Medicine | Admitting: Internal Medicine

## 2023-10-21 DIAGNOSIS — Z1231 Encounter for screening mammogram for malignant neoplasm of breast: Secondary | ICD-10-CM | POA: Insufficient documentation

## 2023-11-02 DIAGNOSIS — R040 Epistaxis: Secondary | ICD-10-CM | POA: Diagnosis not present

## 2023-11-02 DIAGNOSIS — I10 Essential (primary) hypertension: Secondary | ICD-10-CM | POA: Diagnosis not present

## 2023-11-06 DIAGNOSIS — I10 Essential (primary) hypertension: Secondary | ICD-10-CM | POA: Diagnosis not present

## 2023-11-06 DIAGNOSIS — R040 Epistaxis: Secondary | ICD-10-CM | POA: Diagnosis not present

## 2023-11-16 DIAGNOSIS — J449 Chronic obstructive pulmonary disease, unspecified: Secondary | ICD-10-CM | POA: Diagnosis not present

## 2023-12-16 DIAGNOSIS — J449 Chronic obstructive pulmonary disease, unspecified: Secondary | ICD-10-CM | POA: Diagnosis not present

## 2023-12-20 ENCOUNTER — Ambulatory Visit: Payer: Self-pay

## 2023-12-20 NOTE — Telephone Encounter (Signed)
 FYI Only or Action Required?: Action required by provider: request for appointment.  Patient was last seen in primary care on 06/26/2023 by Tobie Suzzane POUR, MD.  Called Nurse Triage reporting Oral Swelling.  Symptoms began yesterday.  Interventions attempted: OTC medications: Benadryl .  Symptoms are: unchanged.States bottom lip started swelling last night, took Benadryl  and it helped some. No SOB or difficulty swallowing. No availability, pt. Will go to UC.   Triage Disposition: See HCP Within 4 Hours (Or PCP Triage)  Patient/caregiver understands and will follow disposition?: Yes   Copied from CRM #[id number]. Topic: Clinical - Red Word Triage >> Dec 20, 2023  8:01 AM Larissa RAMAN wrote: [facility] that prompted transfer to Nurse Triage: bottom lip swelling due to bp medication Answer Assessment - Initial Assessment Questions 1. ONSET: When did the swelling start? (e.g., minutes, hours, days)     Last night  2. SEVERITY: How swollen is it? (e.g., part of an upper or lower lip, both lips)     Lower 3. ITCHING: Is there any itching? If Yes, ask: How much?   (Scale 1-10; mild, moderate or severe)     moderate 4. PAIN: Is the swelling painful to touch? If Yes, ask: How painful is it?   (Scale 1-10; mild, moderate or severe)     no 5. CAUSE: What do you think is causing the lip swelling? (e.g., certain food or medicine, recent cosmetic treatment)     BP med 6. RECURRENT SYMPTOM: Have you had lip swelling before? If Yes, ask: When was the last time? What happened that time?     yes 7. OTHER SYMPTOMS: Do you have any other symptoms? (e.g., fever, toothache)     no 8. PREGNANCY: Is there any chance you are pregnant? When was your last menstrual period?     no  Protocols used: Lip Swelling-A-AH

## 2023-12-20 NOTE — Telephone Encounter (Signed)
Noted, patient advised to go to urgent care

## 2023-12-21 IMAGING — DX DG PORTABLE PELVIS
1 series · 1 of 1 positions shown · non-contrast
Comparison: 12/13/2017, 10/24/2021

CLINICAL DATA: Status post revision right hip replacement

EXAM:
PORTABLE PELVIS 1-2 VIEWS

[pelvis]
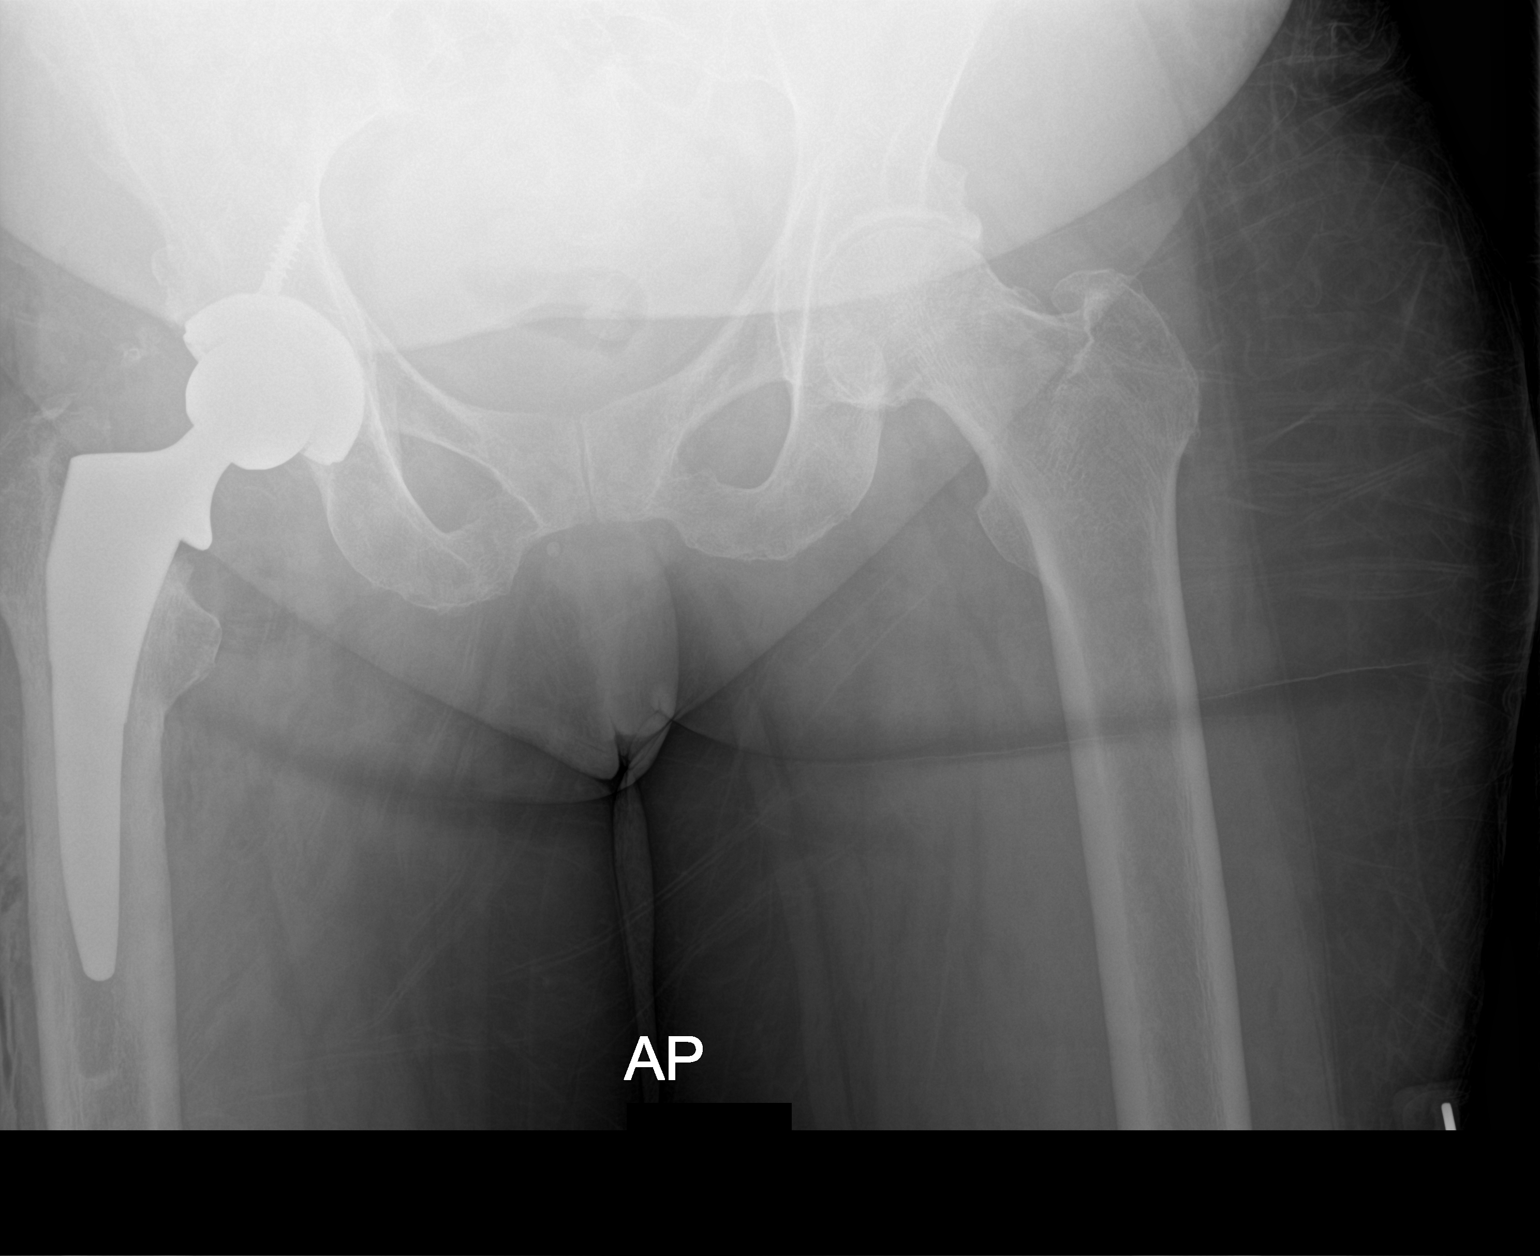

[1 of 1 positions shown; findings below may reference images not displayed]

FINDINGS: Status post revision of right hip replacement. Intact hardware and
normal alignment. No fracture is seen
IMPRESSION: Interval revision of right hip replacement

## 2023-12-21 IMAGING — RF DG HIP (WITH OR WITHOUT PELVIS) 1V*R*
1 series · 3 of 3 positions shown · non-contrast
Comparison: Preoperative radiograph 07/06/2021

CLINICAL DATA: Revision of right hip replacement.

EXAM:
DG HIP (WITH OR WITHOUT PELVIS) 1V RIGHT

[Series 1: dg x-ray · 0.20mm/px · 3 of 3 slices shown]
[im 1/3]
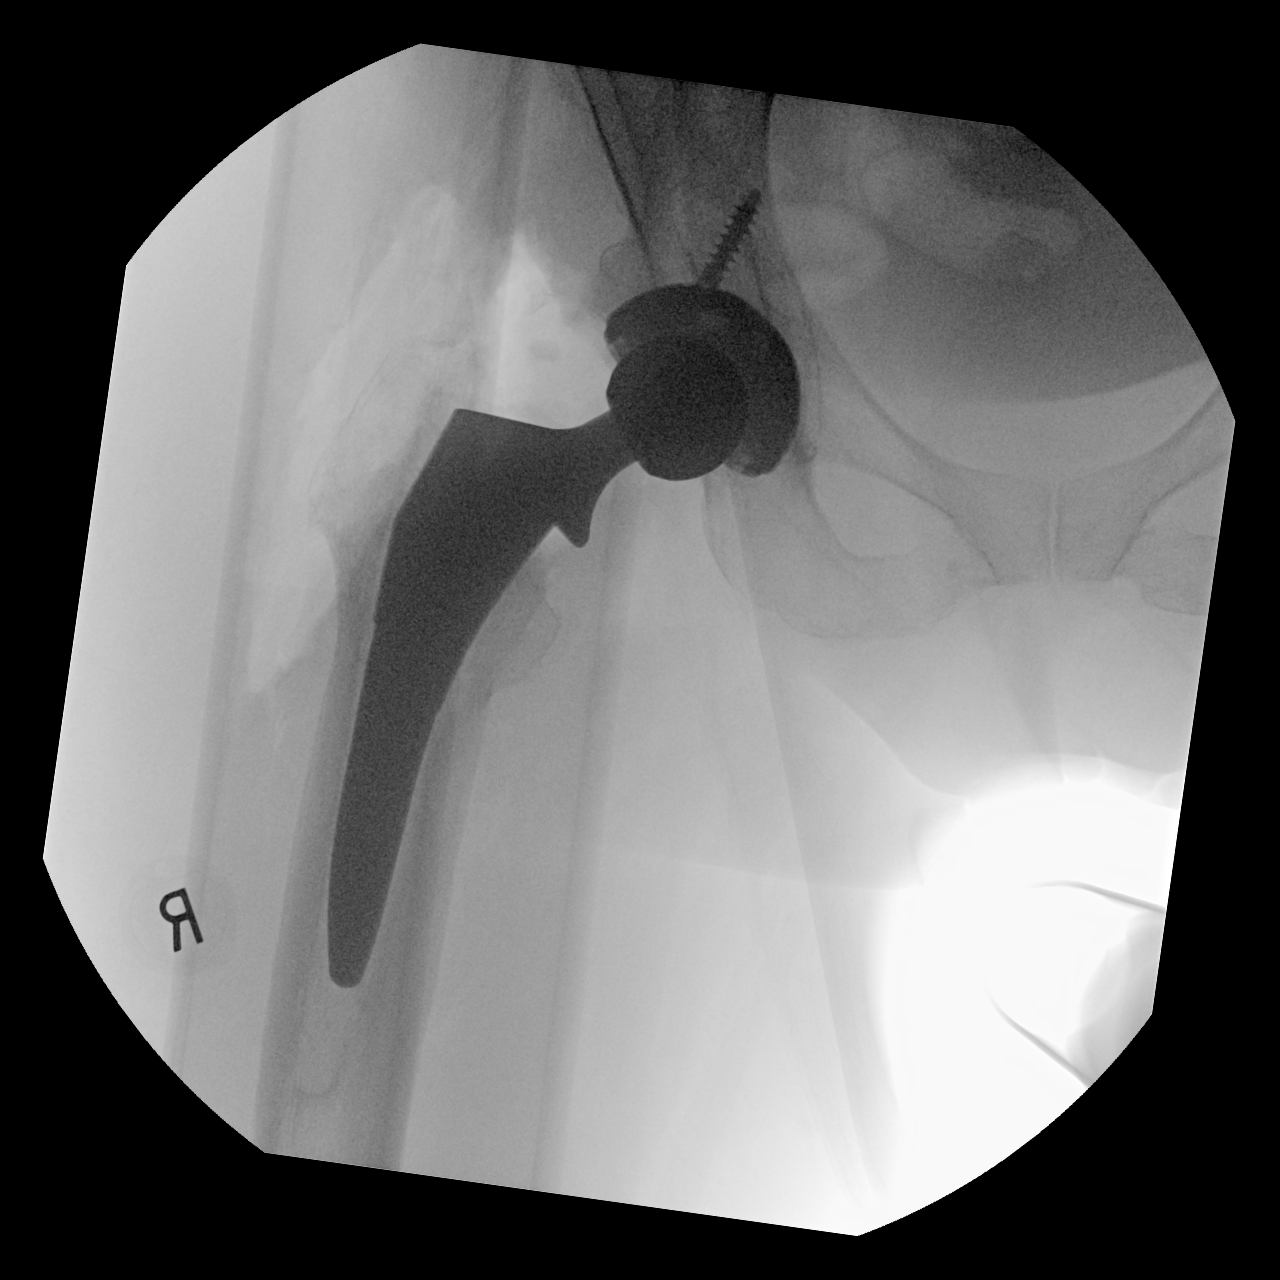
[im 2/3]
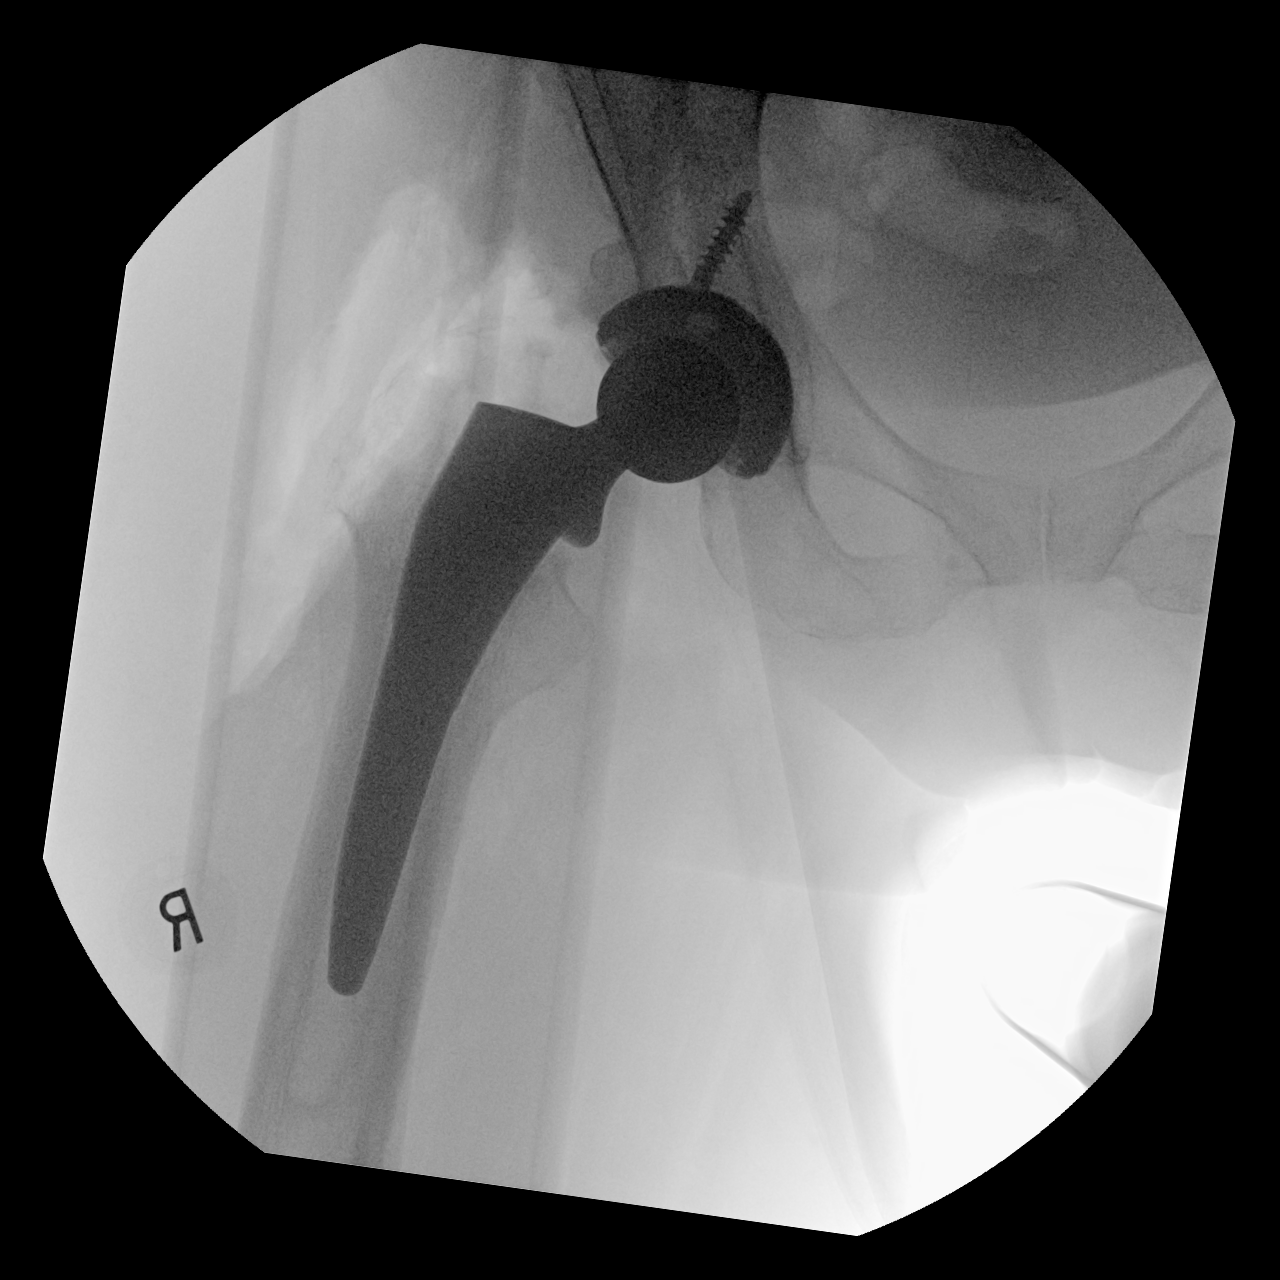
[im 3/3]
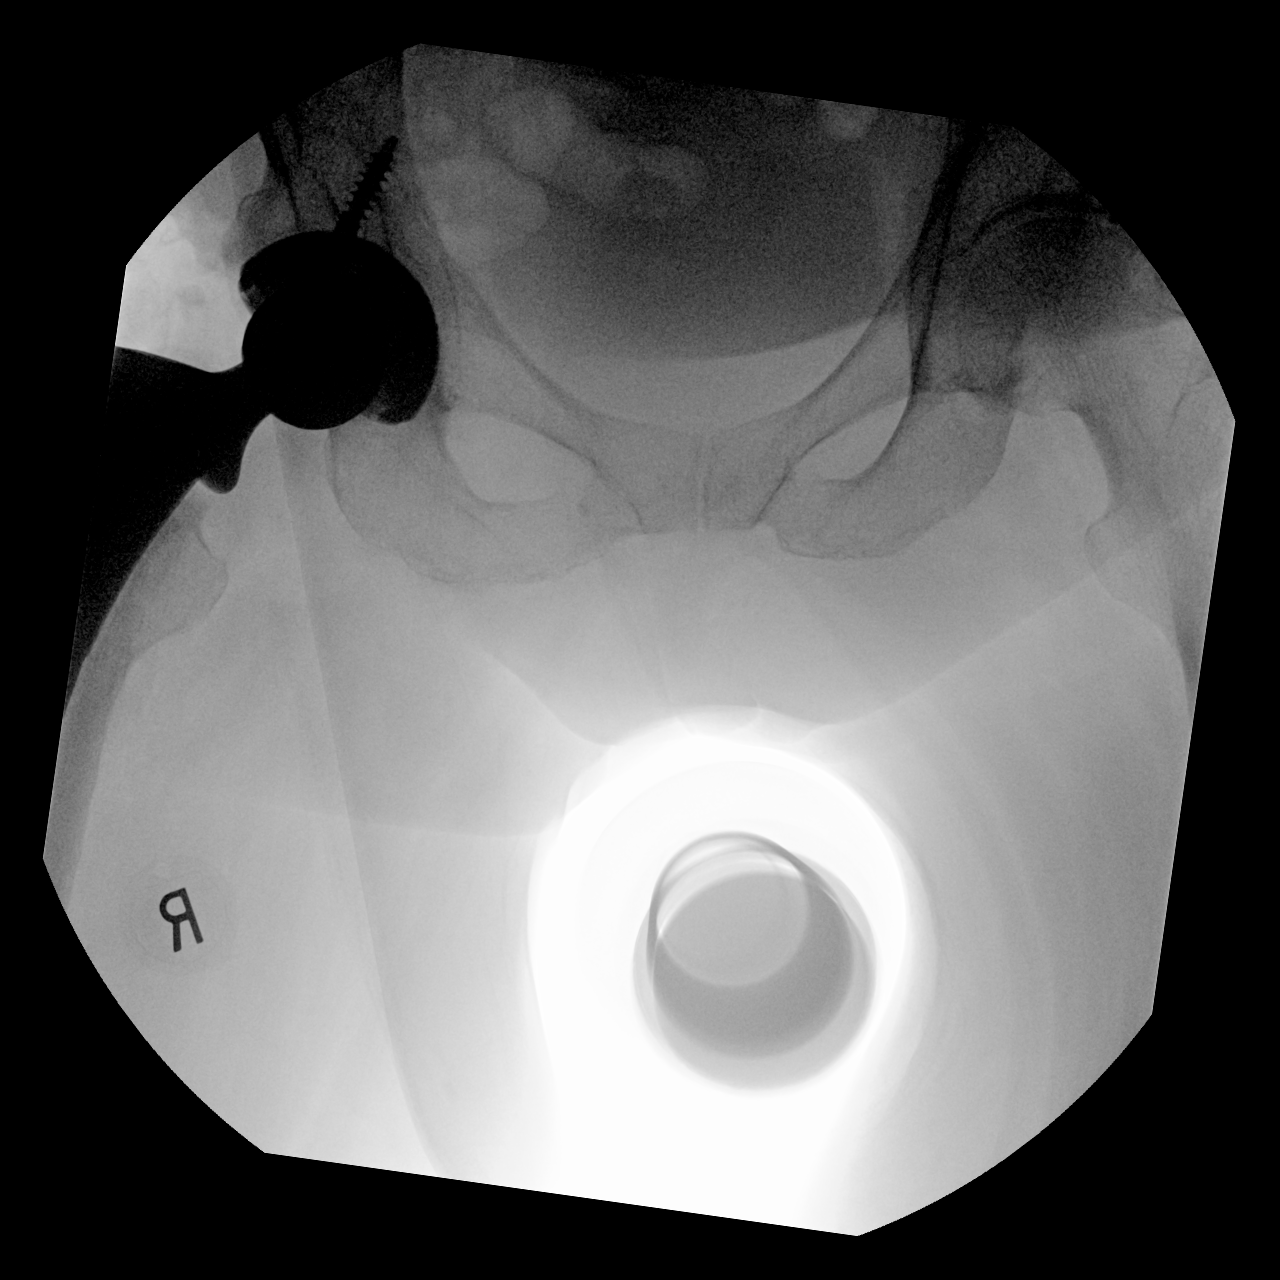

[3 of 3 positions shown; findings below may reference images not displayed]

FINDINGS: Three fluoroscopic spot views of the pelvis and right hip obtained
in the operating room. Right hip arthroplasty in place. Fluoroscopy
time 8 seconds. Dose 0.946 mGy.
IMPRESSION: Intraoperative fluoroscopy during right hip arthroplasty revision.

## 2023-12-24 ENCOUNTER — Ambulatory Visit

## 2023-12-24 ENCOUNTER — Ambulatory Visit: Payer: 59 | Admitting: Internal Medicine

## 2023-12-24 VITALS — BP 160/88 | HR 76 | Ht 68.0 in | Wt 208.0 lb

## 2023-12-24 DIAGNOSIS — T7840XA Allergy, unspecified, initial encounter: Secondary | ICD-10-CM | POA: Diagnosis not present

## 2023-12-24 DIAGNOSIS — I10 Essential (primary) hypertension: Secondary | ICD-10-CM | POA: Diagnosis not present

## 2023-12-24 MED ORDER — HYDROCHLOROTHIAZIDE 25 MG PO TABS: 25.0000 mg | ORAL_TABLET | Freq: Every day | ORAL | 1 refills | Status: DC

## 2023-12-24 NOTE — Assessment & Plan Note (Signed)
 BP Readings from Last 1 Encounters:  12/24/23 (!) 160/88   We will stop the losartan  and add back hydrochlorothiazide  25 mg daily.  She will continue with amlodipine  5 mg.  She has follow-up appointment on 01/08/2024 with Dr. Tobie.  Will reevaluate blood pressure at that time.  Advised to call if she should experience return of itching and hives before that time.

## 2023-12-24 NOTE — Progress Notes (Signed)
 Established Patient Office Visit  Subjective   Patient ID: Jeanette Compton, female    DOB: September 13, 1955  Age: 68 y.o. MRN: [id number]  Chief Complaint  Patient presents with   Medical Management of Chronic Issues    Lip swollen been having itchy whelps come up on her body in different areas arm,leg, and bottom of foot    HPI Rash: Patient reports diffuse rash and itching all over her body, swelling in her lips since starting on the losartan -hydrochlorothiazide .  She states that she had a similar reaction to lisinopril.  She stopped taking losartan  about 3 days ago and reports improvement of symptoms.  She denies any other new medications or supplements at this time.    Patient Active Problem List   Diagnosis Date Noted   Cecal polyp 08/26/2023   Adenomatous polyp of appendix 08/26/2023   Subacute cough 06/26/2023   Status post revision of total hip replacement 11/06/2021   Loosening of femoral component of prosthetic right hip (HCC) 10/24/2021   Status post arthroscopy of right knee 06/15/2021   Type 2 diabetes mellitus with obesity (HCC) 05/12/2021   Encounter for general adult medical examination with abnormal findings 06/09/2019   Hypothyroidism, adult 02/10/2019   Obesity (BMI 30.0-34.9) 02/10/2019   Vitamin D  deficiency 02/10/2019   Rectal bleeding 07/29/2018   Status post total replacement of right hip 12/13/2017   Unilateral primary osteoarthritis, right hip 09/11/2017   Papilloma of right breast    Neuralgia, post-herpetic 11/13/2016   History of colonic polyps 10/05/2016   HLD (hyperlipidemia) 09/10/2016   Essential hypertension 09/10/2016   Primary vitiligo 09/10/2016   Osteoarthritis of knee 09/10/2016   Chronic lower back pain 09/10/2016      ROS    Objective:     BP (!) 160/88   Pulse 76   Ht 5' 8 (1.727 m)   Wt 208 lb 0.6 oz (94.4 kg)   SpO2 97%   BMI 31.63 kg/m  BP Readings from Last 3 Encounters:  12/24/23 (!) 160/88  08/26/23 124/71  06/26/23  133/85   Wt Readings from Last 3 Encounters:  12/24/23 208 lb 0.6 oz (94.4 kg)  08/26/23 192 lb (87.1 kg)  06/26/23 201 lb 12.8 oz (91.5 kg)      Physical Exam Vitals and nursing note reviewed.  Constitutional:      Appearance: Normal appearance.  HENT:     Head: Normocephalic.     Mouth/Throat:     Mouth: Mucous membranes are moist.     Pharynx: Oropharynx is clear.  Eyes:     Extraocular Movements: Extraocular movements intact.     Pupils: Pupils are equal, round, and reactive to light.  Cardiovascular:     Rate and Rhythm: Normal rate and regular rhythm.  Pulmonary:     Effort: Pulmonary effort is normal.     Breath sounds: Normal breath sounds.  Musculoskeletal:     Cervical back: Normal range of motion and neck supple.  Neurological:     Mental Status: She is alert and oriented to person, place, and time.  Psychiatric:        Mood and Affect: Mood normal.        Thought Content: Thought content normal.     No results found for any visits on 12/24/23.    The 10-year ASCVD risk score (Arnett DK, et al., 2019) is: 26.9%    Assessment & Plan:   Problem List Items Addressed This Visit  Cardiovascular and Mediastinum   Essential hypertension - Primary   BP Readings from Last 1 Encounters:  12/24/23 (!) 160/88   We will stop the losartan  and add back hydrochlorothiazide  25 mg daily.  She will continue with amlodipine  5 mg.  She has follow-up appointment on 01/08/2024 with Dr. Tobie.  Will reevaluate blood pressure at that time.  Advised to call if she should experience return of itching and hives before that time.      Relevant Medications   hydrochlorothiazide  (HYDRODIURIL ) 25 MG tablet   Other Visit Diagnoses       Allergic reaction to drug, initial encounter       Advised her to discontinue losartan , which she is already done.  Continue with Benadryl  as needed for itch relief.       No follow-ups on file.    Leita Longs, FNP

## 2023-12-31 ENCOUNTER — Telehealth: Payer: Self-pay

## 2023-12-31 NOTE — Telephone Encounter (Signed)
 Copied from CRM [id number]. Topic: Clinical - Medication Question >> Dec 31, 2023  3:38 PM Nathanel BROCKS wrote: Reason for CRM: pt states that she was just given  hydrochlorothiazide  (HYDRODIURIL ) 25 MG tablet and wants to know does she still take the other bp medication as well? Please advise.

## 2024-01-01 NOTE — Telephone Encounter (Signed)
 Pt has been informed.

## 2024-01-08 ENCOUNTER — Encounter: Payer: Self-pay | Admitting: Internal Medicine

## 2024-01-08 ENCOUNTER — Ambulatory Visit (INDEPENDENT_AMBULATORY_CARE_PROVIDER_SITE_OTHER): Admitting: Internal Medicine

## 2024-01-08 VITALS — BP 132/82 | HR 81 | Ht 68.0 in | Wt 205.2 lb

## 2024-01-08 DIAGNOSIS — Z7984 Long term (current) use of oral hypoglycemic drugs: Secondary | ICD-10-CM

## 2024-01-08 DIAGNOSIS — E1169 Type 2 diabetes mellitus with other specified complication: Secondary | ICD-10-CM | POA: Diagnosis not present

## 2024-01-08 DIAGNOSIS — E039 Hypothyroidism, unspecified: Secondary | ICD-10-CM

## 2024-01-08 DIAGNOSIS — I10 Essential (primary) hypertension: Secondary | ICD-10-CM

## 2024-01-08 DIAGNOSIS — E66811 Obesity, class 1: Secondary | ICD-10-CM

## 2024-01-08 DIAGNOSIS — E782 Mixed hyperlipidemia: Secondary | ICD-10-CM

## 2024-01-08 DIAGNOSIS — Z6831 Body mass index (BMI) 31.0-31.9, adult: Secondary | ICD-10-CM

## 2024-01-08 NOTE — Assessment & Plan Note (Signed)
 Lab Results  Component Value Date   TSH 0.451 06/26/2023   On levothyroxine  75 mcg daily Check TSH and free T4

## 2024-01-08 NOTE — Assessment & Plan Note (Addendum)
 BMI Readings from Last 3 Encounters:  01/08/24 31.20 kg/m  12/24/23 31.63 kg/m  08/26/23 30.99 kg/m   Advised to follow low-carb diet and increase physical activity as tolerated On GLP-1 agonist for DM Discussed about Mounjaro and Ozempic , but she prefers to stay with Rybelsus  for now

## 2024-01-08 NOTE — Assessment & Plan Note (Signed)
 Checked lipid profile On Crestor  5 mg QD

## 2024-01-08 NOTE — Assessment & Plan Note (Addendum)
 Lab Results  Component Value Date   HGBA1C 7.1 (H) 06/26/2023   Was uncontrolled due to diet noncompliance, but she reports that she has improved her diet and increased physical activity since the last visit On Rybelsus  7 mg QD, would increase dose if HbA1c > 7 Advised to follow diabetic diet On ARB and statin F/u HbA1C, CMP and lipid panel Diabetic eye exam: Advised to follow up with Ophthalmology for diabetic eye exam

## 2024-01-08 NOTE — Assessment & Plan Note (Signed)
 BP Readings from Last 1 Encounters:  01/08/24 132/82   Well-controlled with HCTZ 25 mg QD and Amlodipine  5 mg QD Had to stop Losartan  due to angioedema-like reaction, has had similar reaction to Lisinopril in the past Counseled for compliance with the medications Advised DASH diet and moderate exercise/walking, at least 150 mins/week

## 2024-01-08 NOTE — Patient Instructions (Addendum)
 Please continue to take medications as prescribed.  Please continue to follow low carb diet and perform moderate exercise/walking as tolerated.

## 2024-01-08 NOTE — Progress Notes (Signed)
 Established Patient Office Visit  Subjective:  Patient ID: Jeanette Compton, female    DOB: [birth year]  Age: 68 y.o. MRN: [id number]  CC:  Chief Complaint  Patient presents with   Hypertension    Has been doing better with medication change.    Diabetes    6 month f/u     HPI Jeanette Compton is a 68 y.o. female with past medical history of HTN, type II DM, hypothyroidism, OA of hip and knee, vitiligo and obesity who presents for f/u of her chronic medical conditions.  HTN: Her BP is wnl today.  She has been taking HCTZ 25 mg and amlodipine  5 mg daily.  She denies any headache, dizziness, chest pain, dyspnea or palpitations.  Type II DM: She has ben taking Rybelsus  7 mg QD for it. Her HbA1C was 7.1 in 02/25. She denies any fatigue, polyuria, polydipsia or polyphagia. She had lost about 12 lbs since starting Rybelsus , but has not been able to lose weight since the last visit.  She has been trying to improve her diet.  She has chronic right hip pain, which is aggravated with humidity.  She attributes elevated BP to hip pain.  She has had right THA revision surgery in 2023.   Past Medical History:  Diagnosis Date   Arthritis    knee , ankle and back   Bronchitis    Cataract    Diabetes mellitus without complication (HCC) 02/10/2019   Hyperlipidemia    Hypertension    Hypothyroidism, adult 02/10/2019   Neuralgia, post-herpetic 11/13/2016   Obesity (BMI 30.0-34.9) 02/10/2019   Thyroid  disease    Vitamin D  deficiency disease 02/10/2019    Past Surgical History:  Procedure Laterality Date   ABDOMINAL HYSTERECTOMY     fibroid   ANTERIOR HIP REVISION Right 10/24/2021   Procedure: RIGHT ANTERIOR HIP REVISION;  Surgeon: Vernetta Lonni GRADE, MD;  Location: [facility] OR;  Service: Orthopedics;  Laterality: Right;   BREAST BIOPSY Right 07/31/2017   lumpectomy for papilloma   BREAST SURGERY Right    COLONOSCOPY  09/13/2011   Procedure: COLONOSCOPY;  Surgeon: Claudis RAYMOND Rivet, MD;  Location:  [facility];  Service: Endoscopy;  Laterality: N/A;  1030   COLONOSCOPY N/A 07/31/2018   Procedure: COLONOSCOPY;  Surgeon: Rivet Claudis RAYMOND, MD;  Location: [facility];  Service: Endoscopy;  Laterality: N/A;   COLONOSCOPY N/A 08/26/2023   Procedure: COLONOSCOPY;  Surgeon: Cinderella Deatrice FALCON, MD;  Location: [facility];  Service: Endoscopy;  Laterality: N/A;  9:45AM;ASA 1-2   POLYPECTOMY  07/31/2018   Procedure: POLYPECTOMY;  Surgeon: Rivet Claudis RAYMOND, MD;  Location: [facility];  Service: Endoscopy;;   TOTAL HIP ARTHROPLASTY Right 12/13/2017   Procedure: RIGHT TOTAL HIP ARTHROPLASTY ANTERIOR APPROACH;  Surgeon: Vernetta Lonni GRADE, MD;  Location: [facility];  Service: Orthopedics;  Laterality: Right;    Family History  Problem Relation Age of Onset   Stroke Mother    Diabetes Mother    Alcohol abuse Father    Early death Father    Early death Sister        pneumonia   Cancer Brother    Alzheimer's disease Sister    COPD Brother    Colon cancer Neg Hx     Social History   Socioeconomic History   Marital status: Single    Spouse name: Not on file   Number of children: 3   Years of education: 10   Highest education level:  10th grade  Occupational History   Occupation: disability  Tobacco Use   Smoking status: Former    Current packs/day: 0.00    Average packs/day: 0.5 packs/day for 3.0 years (1.5 ttl pk-yrs)    Types: Cigarettes    Start date: 63    Quit date: 50    Years since quitting: 44.6   Smokeless tobacco: Never  Vaping Use   Vaping status: Never Used  Substance and Sexual Activity   Alcohol use: No   Drug use: Yes    Types: Marijuana   Sexual activity: Never    Birth control/protection: Surgical  Other Topics Concern   Not on file  Social History Narrative      Comments: Divorced x 3. Single. Has 3 children (2 boys and 1 girl), 9 grandchildren, 1 great-grandchild. Disability from arthritis in back. 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      Social Drivers of Corporate investment banker Strain: Low Risk  (12/21/2023)   Overall Financial Resource Strain (CARDIA)    Difficulty of Paying Living Expenses: Not hard at all  Food Insecurity: Unknown (12/21/2023)   Hunger Vital Sign    Worried About Running Out of Food in the Last Year: Never true    Ran Out of Food in the Last Year: Patient declined  Transportation Needs: No Transportation Needs (12/21/2023)   PRAPARE - Administrator, Civil Service (Medical): No    Lack of Transportation (Non-Medical): No  Physical Activity: Insufficiently Active (12/21/2023)   Exercise Vital Sign    Days of Exercise per Week: 3 days    Minutes of Exercise per Session: 30 min  Stress: No Stress Concern Present (12/21/2023)   [employer] of Occupational Health - Occupational Stress Questionnaire    Feeling of Stress: Not at all  Social Connections: Moderately Isolated (12/21/2023)   Social Connection and Isolation Panel    Frequency of Communication with Friends and Family: More than three times a week    Frequency of Social Gatherings with Friends and Family: Three times a week    Attends Religious Services: More than 4 times per year    Active Member of Clubs or Organizations: No    Attends Banker Meetings: Not on file    Marital Status: Divorced  Intimate Partner Violence: Not At Risk (05/08/2023)   Humiliation, Afraid, Rape, and Kick questionnaire    Fear of Current or Ex-Partner: No    Emotionally Abused: No    Physically Abused: No    Sexually Abused: No    Outpatient Medications Prior to Visit  Medication Sig Dispense Refill   amLODipine  (NORVASC ) 5 MG tablet TAKE 1 TABLET BY MOUTH DAILY 100 tablet 2   Cholecalciferol (VITAMIN D3) 125 MCG (5000 UT) CAPS Take 2 capsules by mouth daily.      hydrochlorothiazide  (HYDRODIURIL ) 25 MG tablet Take 1 tablet (25 mg total) by mouth daily. 90 tablet 1   levothyroxine   (SYNTHROID ) 75 MCG tablet TAKE 1 TABLET BY MOUTH DAILY 100 tablet 2   Misc. Devices MISC Blood pressure cuff/device - 1. ICD10: I10 1 each 0   Multiple Vitamins-Minerals (HAIR SKIN AND NAILS FORMULA) TABS Take 2 tablets by mouth daily.     rosuvastatin  (CRESTOR ) 5 MG tablet TAKE 1 TABLET BY MOUTH DAILY 100 tablet 2   RYBELSUS  7 MG TABS TAKE 1 TABLET BY MOUTH DAILY 90 tablet 3   No facility-administered  medications prior to visit.    Allergies  Allergen Reactions   Lisinopril Swelling and Other (See Comments)    Lips swelled   Losartan  Itching    ROS Review of Systems  Constitutional:  Negative for chills and fever.  HENT:  Negative for congestion, sinus pressure, sinus pain and sore throat.   Eyes:  Negative for pain and discharge.  Respiratory:  Negative for cough and shortness of breath.   Cardiovascular:  Negative for chest pain and palpitations.  Gastrointestinal:  Negative for abdominal pain, diarrhea, nausea and vomiting.  Endocrine: Negative for polydipsia and polyuria.  Genitourinary:  Negative for dysuria and hematuria.  Musculoskeletal:  Positive for arthralgias (R hip and knee). Negative for neck pain and neck stiffness.  Skin:  Negative for rash.  Neurological:  Negative for dizziness and weakness.  Psychiatric/Behavioral:  Negative for agitation and behavioral problems.       Objective:    Physical Exam Vitals reviewed.  Constitutional:      General: She is not in acute distress.    Appearance: She is not diaphoretic.  HENT:     Head: Normocephalic and atraumatic.     Nose: Nose normal. No congestion.     Mouth/Throat:     Mouth: Mucous membranes are moist.     Pharynx: No posterior oropharyngeal erythema.  Eyes:     General: No scleral icterus.    Extraocular Movements: Extraocular movements intact.  Cardiovascular:     Rate and Rhythm: Normal rate and regular rhythm.     Heart sounds: Normal heart sounds. No murmur heard. Pulmonary:     Breath  sounds: Normal breath sounds. No wheezing or rales.  Musculoskeletal:     Cervical back: Neck supple. No tenderness.     Right lower leg: No edema.     Left lower leg: No edema.  Skin:    General: Skin is warm.     Findings: No rash.  Neurological:     General: No focal deficit present.     Mental Status: She is alert and oriented to person, place, and time.     Sensory: No sensory deficit.     Motor: No weakness.  Psychiatric:        Mood and Affect: Mood normal.        Behavior: Behavior normal.     BP 132/82 (BP Location: Left Arm)   Pulse 81   Ht 5' 8 (1.727 m)   Wt 205 lb 3.2 oz (93.1 kg)   SpO2 98%   BMI 31.20 kg/m  Wt Readings from Last 3 Encounters:  01/08/24 205 lb 3.2 oz (93.1 kg)  12/24/23 208 lb 0.6 oz (94.4 kg)  08/26/23 192 lb (87.1 kg)    Lab Results  Component Value Date   TSH 0.451 06/26/2023   Lab Results  Component Value Date   WBC 8.3 06/26/2023   HGB 12.9 06/26/2023   HCT 39.5 06/26/2023   MCV 82 06/26/2023   PLT 336 06/26/2023   Lab Results  Component Value Date   NA 137 08/22/2023   K 3.6 08/22/2023   CO2 25 08/22/2023   GLUCOSE 168 (H) 08/22/2023   BUN 14 08/22/2023   CREATININE 0.78 08/22/2023   BILITOT 0.3 06/26/2023   ALKPHOS 101 06/26/2023   AST 20 06/26/2023   ALT 15 06/26/2023   PROT 7.9 06/26/2023   ALBUMIN 4.6 06/26/2023   CALCIUM  9.9 08/22/2023   ANIONGAP 11 08/22/2023   EGFR 82 06/26/2023  Lab Results  Component Value Date   CHOL 144 06/26/2023   Lab Results  Component Value Date   HDL 54 06/26/2023   Lab Results  Component Value Date   LDLCALC 62 06/26/2023   Lab Results  Component Value Date   TRIG 167 (H) 06/26/2023   Lab Results  Component Value Date   CHOLHDL 2.7 06/26/2023   Lab Results  Component Value Date   HGBA1C 7.1 (H) 06/26/2023      Assessment & Plan:   Problem List Items Addressed This Visit       Cardiovascular and Mediastinum   Essential hypertension - Primary   BP  Readings from Last 1 Encounters:  01/08/24 132/82   Well-controlled with HCTZ 25 mg QD and Amlodipine  5 mg QD Had to stop Losartan  due to angioedema-like reaction, has had similar reaction to Lisinopril in the past Counseled for compliance with the medications Advised DASH diet and moderate exercise/walking, at least 150 mins/week        Endocrine   Hypothyroidism, adult   Lab Results  Component Value Date   TSH 0.451 06/26/2023   On levothyroxine  75 mcg daily Check TSH and free T4      Relevant Orders   TSH + free T4   Type 2 diabetes mellitus with obesity (HCC)   Lab Results  Component Value Date   HGBA1C 7.1 (H) 06/26/2023   Was uncontrolled due to diet noncompliance, but she reports that she has improved her diet and increased physical activity since the last visit On Rybelsus  7 mg QD, would increase dose if HbA1c > 7 Advised to follow diabetic diet On ARB and statin F/u HbA1C, CMP and lipid panel Diabetic eye exam: Advised to follow up with Ophthalmology for diabetic eye exam      Relevant Orders   CMP14+EGFR   Hemoglobin A1c     Other   HLD (hyperlipidemia)   Checked lipid profile On Crestor  5 mg QD      Obesity (BMI 30.0-34.9)   BMI Readings from Last 3 Encounters:  01/08/24 31.20 kg/m  12/24/23 31.63 kg/m  08/26/23 30.99 kg/m   Advised to follow low-carb diet and increase physical activity as tolerated On GLP-1 agonist for DM Discussed about Mounjaro and Ozempic , but she prefers to stay with Rybelsus  for now        No orders of the defined types were placed in this encounter.   Follow-up: Return in about 6 months (around 07/10/2024) for Annual physical.    Suzzane MARLA Blanch, MD

## 2024-01-09 ENCOUNTER — Ambulatory Visit: Payer: Self-pay | Admitting: Internal Medicine

## 2024-01-09 ENCOUNTER — Telehealth: Payer: Self-pay

## 2024-01-09 LAB — CMP14+EGFR
ALT: 15 IU/L (ref 0–32)
AST: 17 IU/L (ref 0–40)
Albumin: 4.4 g/dL (ref 3.9–4.9)
Alkaline Phosphatase: 104 IU/L (ref 44–121)
BUN/Creatinine Ratio: 15 (ref 12–28)
BUN: 13 mg/dL (ref 8–27)
Bilirubin Total: 0.3 mg/dL (ref 0.0–1.2)
CO2: 26 mmol/L (ref 20–29)
Calcium: 10.1 mg/dL (ref 8.7–10.3)
Chloride: 98 mmol/L (ref 96–106)
Creatinine, Ser: 0.86 mg/dL (ref 0.57–1.00)
Globulin, Total: 3.5 g/dL (ref 1.5–4.5)
Glucose: 136 mg/dL — ABNORMAL HIGH (ref 70–99)
Potassium: 3.8 mmol/L (ref 3.5–5.2)
Sodium: 140 mmol/L (ref 134–144)
Total Protein: 7.9 g/dL (ref 6.0–8.5)
eGFR: 74 mL/min/1.73 (ref >59)

## 2024-01-09 LAB — HEMOGLOBIN A1C
Est. average glucose Bld gHb Est-mCnc: 154 mg/dL
Hgb A1c MFr Bld: 7 % — ABNORMAL HIGH (ref 4.8–5.6)

## 2024-01-09 LAB — TSH+FREE T4
Free T4: 1.17 ng/dL (ref 0.82–1.77)
TSH: 0.498 u[IU]/mL (ref 0.450–4.500)

## 2024-01-09 MED ORDER — RYBELSUS 14 MG PO TABS: 14.0000 mg | ORAL_TABLET | Freq: Every day | ORAL | 3 refills | Status: AC

## 2024-01-09 NOTE — Addendum Note (Signed)
 Addended byBETHA TOBIE DOWNS on: 01/09/2024 08:12 AM   Modules accepted: Orders

## 2024-01-09 NOTE — Telephone Encounter (Signed)
 Copied from CRM #[id number]. Topic: Clinical - Request for Lab/Test Order >> Jan 09, 2024  8:48 AM Treva T wrote: Reason for CRM: Received call from patient, requesting to speak in detail of most recent lab results.  Patient can be reached at [phone], to discuss further.

## 2024-01-09 NOTE — Telephone Encounter (Signed)
 Labs have been reviewed.

## 2024-01-16 DIAGNOSIS — J449 Chronic obstructive pulmonary disease, unspecified: Secondary | ICD-10-CM | POA: Diagnosis not present

## 2024-01-19 ENCOUNTER — Other Ambulatory Visit: Payer: Self-pay | Admitting: Internal Medicine

## 2024-01-19 DIAGNOSIS — E782 Mixed hyperlipidemia: Secondary | ICD-10-CM

## 2024-01-28 ENCOUNTER — Telehealth: Payer: Self-pay | Admitting: Internal Medicine

## 2024-01-28 NOTE — Telephone Encounter (Signed)
 Cap forms  Noted Copied Sleeved  Original placed provider box Copy placed front desk folder

## 2024-01-30 ENCOUNTER — Telehealth (INDEPENDENT_AMBULATORY_CARE_PROVIDER_SITE_OTHER): Admitting: Family Medicine

## 2024-01-30 ENCOUNTER — Ambulatory Visit: Payer: Self-pay

## 2024-01-30 ENCOUNTER — Encounter: Payer: Self-pay | Admitting: Family Medicine

## 2024-01-30 VITALS — BP 137/84 | HR 74 | Temp 98.5°F | Resp 16 | Ht 68.0 in | Wt 201.0 lb

## 2024-01-30 DIAGNOSIS — B349 Viral infection, unspecified: Secondary | ICD-10-CM

## 2024-01-30 NOTE — Telephone Encounter (Signed)
 Patient scheduled.

## 2024-01-30 NOTE — Assessment & Plan Note (Signed)
-  Pending COVID-19, Influenza, and RSV test results. -Recommend OTC Mucinex Cold & Flu as needed for symptomatic relief. -Take acetaminophen  (Tylenol ) as needed for body aches and fever. -Increase fluid intake and allow for plenty of rest. -Ginger tea may help reduce throat irritation and nausea. -Use a humidifier at bedtime to help with cough and nasal congestion. -For nasal congestion: heated humidified air and/or saline nasal sprays may help alleviate symptoms. -Follow up if symptoms do not improve within 7-10 days from onset, or sooner if symptoms worsen.  Red Flag Symptoms - Seek Care if: -High fever not responding to Tylenol  -Shortness of breath or chest pain -Severe headache, confusion, or dizziness -Worsening cough or inability to keep fluids down

## 2024-01-30 NOTE — Telephone Encounter (Signed)
 FYI Only or Action Required?: FYI only for provider.  Patient was last seen in primary care on 01/08/2024 by Tobie Suzzane POUR, MD.  Called Nurse Triage reporting Cough.  Symptoms began a week ago.  Interventions attempted: Nothing.  Symptoms are: gradually worsening.  Triage Disposition: See Physician Within 24 Hours  Patient/caregiver understands and will follow disposition?: Yes    Copied from CRM #[id number]. Topic: Clinical - Red Word Triage >> Jan 30, 2024  8:13 AM Tiffini S wrote: [facility] that prompted transfer to Nurse Triage: Patient called for COVID testing- symptoms are Monday night felt cold/ chills, last night vomit with coughing and stuffy head Reason for Disposition  SEVERE coughing spells (e.g., whooping sound after coughing, vomiting after coughing)    Possible COVID  Answer Assessment - Initial Assessment Questions 1. ONSET: When did the cough begin?      Monday night 2. SEVERITY: How bad is the cough today?      mild 3. SPUTUM: Describe the color of your sputum (e.g., none, dry cough; clear, white, yellow, green)     Dry cough only at times 4. HEMOPTYSIS: Are you coughing up any blood? If Yes, ask: How much? (e.g., flecks, streaks, tablespoons, etc.)     no 5. DIFFICULTY BREATHING: Are you having difficulty breathing? If Yes, ask: How bad is it? (e.g., mild, moderate, severe)      no 6. FEVER: Do you have a fever? If Yes, ask: What is your temperature, how was it measured, and when did it start?     yes 7. CARDIAC HISTORY: Do you have any history of heart disease? (e.g., heart attack, congestive heart failure)      na 8. LUNG HISTORY: Do you have any history of lung disease?  (e.g., pulmonary embolus, asthma, emphysema)     na 9. PE RISK FACTORS: Do you have a history of blood clots? (or: recent major surgery, recent prolonged travel, bedridden)     na 10. OTHER SYMPTOMS: Do you have any other symptoms? (e.g., runny nose, wheezing,  chest pain)       Stuffy head, stuffy nose, runny nose, body aches & chills, lost of taste, n/v 11. PREGNANCY: Is there any chance you are pregnant? When was your last menstrual period?       na 12. TRAVEL: Have you traveled out of the country in the last month? (e.g., travel history, exposures)       Na  Pt wants to be tested for COVID  Protocols used: Cough - Acute Non-Productive-A-AH

## 2024-01-30 NOTE — Progress Notes (Signed)
 Virtual Visit via Video Note  I connected with Jeanette Compton on 01/30/24 at 10:40 AM EDT by a video enabled telemedicine application and verified that I am speaking with the correct person using two identifiers.  Patient Location: Other:  in the Clinic Provider Location: Office/Clinic  I discussed the limitations, risks, security, and privacy concerns of performing an evaluation and management service by video and the availability of in person appointments. I also discussed with the patient that there may be a patient responsible charge related to this service. The patient expressed understanding and agreed to proceed.  Subjective: PCP: Tobie Suzzane MARLA, MD  Chief Complaint  Patient presents with   Chills    Started Monday night, chills, vomited last night, sweats and loss of appetite and a slight scratchy itchy throat with a dry cough    HPI The patient reports that her symptoms began on Monday night with chills, body aches, and loss of appetite. She experienced vomiting last night and reports associated sweats, a slightly scratchy/itchy throat, and a dry cough. She denies sore throat and fever. She has not taken any medications for symptom relief. She notes that her cousin was ill for four days with similar symptoms prior to her onset.  ROS: Per HPI  Current Outpatient Medications:    amLODipine  (NORVASC ) 5 MG tablet, TAKE 1 TABLET BY MOUTH DAILY, Disp: 100 tablet, Rfl: 2   hydrochlorothiazide  (HYDRODIURIL ) 25 MG tablet, Take 1 tablet (25 mg total) by mouth daily., Disp: 90 tablet, Rfl: 1   levothyroxine  (SYNTHROID ) 75 MCG tablet, TAKE 1 TABLET BY MOUTH DAILY, Disp: 100 tablet, Rfl: 2   Misc. Devices MISC, Blood pressure cuff/device - 1. ICD10: I10, Disp: 1 each, Rfl: 0   Multiple Vitamins-Minerals (HAIR SKIN AND NAILS FORMULA) TABS, Take 2 tablets by mouth daily., Disp: , Rfl:    rosuvastatin  (CRESTOR ) 5 MG tablet, TAKE 1 TABLET BY MOUTH DAILY, Disp: 100 tablet, Rfl: 2    Semaglutide  (RYBELSUS ) 14 MG TABS, Take 1 tablet (14 mg total) by mouth daily., Disp: 90 tablet, Rfl: 3   Cholecalciferol (VITAMIN D3) 125 MCG (5000 UT) CAPS, Take 2 capsules by mouth daily.  (Patient not taking: Reported on 01/30/2024), Disp: , Rfl:   Observations/Objective: Today's Vitals   01/30/24 1048  BP: 137/84  Pulse: 74  Resp: 16  Temp: 98.5 F (36.9 C)  TempSrc: Oral  SpO2: 94%  Weight: 201 lb (91.2 kg)  Height: 5' 8 (1.727 m)   Physical Exam Patient is well-developed, well-nourished in no acute distress.  Resting comfortably at home.  Head is normocephalic, atraumatic.  No labored breathing.  Speech is clear and coherent with logical content.  Patient is alert and oriented at baseline.   Assessment and Plan: Viral illness Assessment & Plan: -Pending COVID-19, Influenza, and RSV test results. -Recommend OTC Mucinex Cold & Flu as needed for symptomatic relief. -Take acetaminophen  (Tylenol ) as needed for body aches and fever. -Increase fluid intake and allow for plenty of rest. -Ginger tea may help reduce throat irritation and nausea. -Use a humidifier at bedtime to help with cough and nasal congestion. -For nasal congestion: heated humidified air and/or saline nasal sprays may help alleviate symptoms. -Follow up if symptoms do not improve within 7-10 days from onset, or sooner if symptoms worsen.  Red Flag Symptoms - Seek Care if: -High fever not responding to Tylenol  -Shortness of breath or chest pain -Severe headache, confusion, or dizziness -Worsening cough or inability to keep fluids down  Orders: -     COVID-19, Flu A+B and RSV    Follow Up Instructions: No follow-ups on file.   I discussed the assessment and treatment plan with the patient. The patient was provided an opportunity to ask questions, and all were answered. The patient agreed with the plan and demonstrated an understanding of the instructions.   The patient was advised to call back or  seek an in-person evaluation if the symptoms worsen or if the condition fails to improve as anticipated.  The above assessment and management plan was discussed with the patient. The patient verbalized understanding of and has agreed to the management plan.   Hayly Litsey, FNP

## 2024-01-30 NOTE — Patient Instructions (Addendum)
 I appreciate the opportunity to provide care to you today!    Viral Illness -Pending COVID-19, Influenza, and RSV test results. -Recommend OTC Mucinex Cold & Flu as needed for symptomatic relief. -Take acetaminophen  (Tylenol ) as needed for body aches and fever. -Increase fluid intake and allow for plenty of rest. -Ginger tea may help reduce throat irritation and nausea. -Use a humidifier at bedtime to help with cough and nasal congestion. -For nasal congestion: heated humidified air and/or saline nasal sprays may help alleviate symptoms. -Follow up if symptoms do not improve within 7-10 days from onset, or sooner if symptoms worsen.  Red Flag Symptoms - Seek Care if: -High fever not responding to Tylenol  -Shortness of breath or chest pain -Severe headache, confusion, or dizziness -Worsening cough or inability to keep fluids down    Please continue to a heart-healthy diet and increase your physical activities. Try to exercise for at least five days a week.    It was a pleasure to see you and I look forward to continuing to work together on your health and well-being. Please do not hesitate to call the office if you need care or have questions about your care.  In case of emergency, please visit the Emergency Department for urgent care, or contact our clinic at [phone] to schedule an appointment. We're here to help you!   Have a wonderful day and week. With Gratitude, Akshith Moncus MSN, FNP-BC

## 2024-01-31 ENCOUNTER — Telehealth: Payer: Self-pay

## 2024-01-31 NOTE — Telephone Encounter (Signed)
 Copied from CRM [id number]. Topic: Clinical - Lab/Test Results >> Jan 31, 2024  9:42 AM Travis F wrote: Reason for CRM: Patient is calling in because she would like to know the results of her COVID test.

## 2024-01-31 NOTE — Telephone Encounter (Signed)
 Spoke to pt , let her know results are not back yet , advised her to set up my chart to be able to see results over the weekend otherwise we will call her until Monday. Verbalized understanding.

## 2024-02-01 ENCOUNTER — Other Ambulatory Visit: Payer: Self-pay | Admitting: Family Medicine

## 2024-02-01 ENCOUNTER — Ambulatory Visit: Payer: Self-pay | Admitting: Family Medicine

## 2024-02-01 ENCOUNTER — Telehealth: Payer: Self-pay | Admitting: Family Medicine

## 2024-02-01 DIAGNOSIS — U071 COVID-19: Secondary | ICD-10-CM

## 2024-02-01 LAB — COVID-19, FLU A+B AND RSV
Influenza A, NAA: NOT DETECTED
Influenza B, NAA: NOT DETECTED
RSV, NAA: NOT DETECTED
SARS-CoV-2, NAA: DETECTED — AB

## 2024-02-01 MED ORDER — NIRMATRELVIR/RITONAVIR (PAXLOVID)TABLET: 3.0000 | ORAL_TABLET | Freq: Two times a day (BID) | ORAL | 0 refills | Status: AC

## 2024-02-01 NOTE — Progress Notes (Signed)
 Your test result is positive for COVID-19. Please begin isolation immediately. Stay home and avoid close contact with others. Isolate for at least 5 days from the start of symptoms (or from the test date if you have no symptoms). After day 5, if your symptoms are improving and you have been fever-free for 24 hours without medication, you may end isolation but should continue wearing a mask through day 10.  A prescription for Paxlovid  has been sent to your pharmacy. Please start taking it as prescribed.Stay hydrated and get plenty of rest.

## 2024-02-01 NOTE — Telephone Encounter (Signed)
 I called and spoke with the patient regarding her positive COVID-19 results. She reports feeling well and is currently asymptomatic. Onset of first symptoms was Sunday, 01/26/2024. Given her current state, we will withhold therapy with Paxlovid  at this time.  The patient was encouraged to continue wearing a mask, get plenty of rest, and stay well hydrated. She was also advised to follow up if symptoms worsen.

## 2024-02-03 ENCOUNTER — Ambulatory Visit (INDEPENDENT_AMBULATORY_CARE_PROVIDER_SITE_OTHER)

## 2024-02-03 DIAGNOSIS — Z23 Encounter for immunization: Secondary | ICD-10-CM

## 2024-02-03 NOTE — Progress Notes (Signed)
 Patient is in office today for a nurse visit for Immunization. Patient Injection was given in the  Right arm. Patient tolerated injection well.

## 2024-03-02 ENCOUNTER — Other Ambulatory Visit: Payer: Self-pay

## 2024-03-02 DIAGNOSIS — I10 Essential (primary) hypertension: Secondary | ICD-10-CM

## 2024-03-17 DIAGNOSIS — J449 Chronic obstructive pulmonary disease, unspecified: Secondary | ICD-10-CM | POA: Diagnosis not present

## 2024-05-11 ENCOUNTER — Ambulatory Visit (INDEPENDENT_AMBULATORY_CARE_PROVIDER_SITE_OTHER): Payer: 59

## 2024-05-11 VITALS — Ht 68.0 in | Wt 201.0 lb

## 2024-05-11 DIAGNOSIS — E669 Obesity, unspecified: Secondary | ICD-10-CM

## 2024-05-11 DIAGNOSIS — Z0000 Encounter for general adult medical examination without abnormal findings: Secondary | ICD-10-CM | POA: Diagnosis not present

## 2024-05-11 MED ORDER — LANCET DEVICE MISC: 1.0000 | Freq: Three times a day (TID) | 0 refills | Status: AC

## 2024-05-11 MED ORDER — BLOOD GLUCOSE MONITORING SUPPL DEVI: 1.0000 | Freq: Three times a day (TID) | 0 refills | Status: AC

## 2024-05-11 MED ORDER — LANCETS MISC: 1.0000 | 0 refills | Status: AC

## 2024-05-11 MED ORDER — BLOOD GLUCOSE TEST VI STRP: 1.0000 | ORAL_STRIP | Freq: Three times a day (TID) | 0 refills | Status: AC

## 2024-05-11 NOTE — Patient Instructions (Signed)
 Jeanette Compton,  Thank you for taking the time for your Medicare Wellness Visit. I appreciate your continued commitment to your health goals. Please review the care plan we discussed, and feel free to reach out if I can assist you further.  Please note that Annual Wellness Visits do not include a physical exam. Some assessments may be limited, especially if the visit was conducted virtually. If needed, we may recommend an in-person follow-up with your provider.  Ongoing Care Seeing your primary care provider every 3 to 6 months helps us  monitor your health and provide consistent, personalized care.   1 year follow up for Medicare well visit: Wednesday May 12, 2025 at 9:20 am with medicare wellness nurse in office  Recommended Screenings:  Health Maintenance  Topic Date Due   Zoster (Shingles) Vaccine (1 of 2) Never done   DTaP/Tdap/Td vaccine (2 - Tdap) 05/22/2019   COVID-19 Vaccine (3 - Moderna risk series) 12/17/2019   Eye exam for diabetics  05/18/2022   Medicare Annual Wellness Visit  05/07/2024   Yearly kidney health urinalysis for diabetes  06/25/2024   Hemoglobin A1C  07/10/2024   Osteoporosis screening with Bone Density Scan  08/27/2024   Breast Cancer Screening  10/20/2024   Yearly kidney function blood test for diabetes  01/07/2025   Complete foot exam   01/07/2025   Pneumococcal Vaccine for age over 78 (3 of 3 - PCV20 or PCV21) 04/11/2025   Colon Cancer Screening  08/25/2028   Flu Shot  Completed   Hepatitis C Screening  Completed   Meningitis B Vaccine  Aged Out       05/11/2024   11:19 AM  Advanced Directives  Does Patient Have a Medical Advance Directive? No  Would patient like information on creating a medical advance directive? No - Patient declined    Vision: Annual vision screenings are recommended for early detection of glaucoma, cataracts, and diabetic retinopathy. These exams can also reveal signs of chronic conditions such as diabetes and high blood  pressure.  Dental: Annual dental screenings help detect early signs of oral cancer, gum disease, and other conditions linked to overall health, including heart disease and diabetes.  Please see the attached documents for additional preventive care recommendations.

## 2024-05-11 NOTE — Addendum Note (Signed)
 Addended by: ALLYSON PROFFER A on: 05/11/2024 11:36 AM   Modules accepted: Orders

## 2024-05-11 NOTE — Progress Notes (Signed)
 "  HM Addressed: List of eye doctors for [place] and [place] area mailed to patient  Chief Complaint  Patient presents with   Medicare Wellness     Subjective:   Jeanette Compton is a 68 y.o. female who presents for a Medicare Annual Wellness Visit.  Visit info / Clinical Intake: Medicare Wellness Visit Type:: Subsequent Annual Wellness Visit Persons participating in visit and providing information:: patient Medicare Wellness Visit Mode:: Video Since this visit was completed virtually, some vitals may be partially provided or unavailable. Missing vitals are due to the limitations of the virtual format.: Documented vitals are patient reported If Telephone or Video please confirm:: I connected with patient using audio/video enable telemedicine. I verified patient identity with two identifiers, discussed telehealth limitations, and patient agreed to proceed. Patient Location:: home Provider Location:: office Interpreter Needed?: No Pre-visit prep was completed: yes AWV questionnaire completed by patient prior to visit?: no Living arrangements:: with family/others Patient's Overall Health Status Rating: good Typical amount of pain: (!) a lot Does pain affect daily life?: (!) yes Are you currently prescribed opioids?: no  Dietary Habits and Nutritional Risks How many meals a day?: 2 Eats fruit and vegetables daily?: yes Most meals are obtained by: preparing own meals In the last 2 weeks, have you had any of the following?: none Diabetic:: (!) yes Any non-healing wounds?: no How often do you check your BS?: 0 (discussed importance of checking blood sugars with patient verbalizing understanding. prescription sent in for home glucose meter.) Would you like to be referred to a Nutritionist or for Diabetic Management? : no  Functional Status Activities of Daily Living (to include ambulation/medication): Independent Ambulation: Independent Medication Administration:  Independent Home Management (perform basic housework or laundry): Independent Manage your own finances?: yes Primary transportation is: driving Concerns about vision?: (!) yes (patient states she has problems seeing from [facility]. list of eye doctors mailed to patient) Concerns about hearing?: no  Fall Screening Falls in the past year?: 0 Number of falls in past year: 0 Was there an injury with Fall?: 0 Fall Risk Category Calculator: 0 Patient Fall Risk Level: Low Fall Risk  Fall Risk Patient at Risk for Falls Due to: No Fall Risks Fall risk Follow up: Falls evaluation completed; Education provided; Falls prevention discussed  Home and Transportation Safety: All rugs have non-skid backing?: N/A, no rugs All stairs or steps have railings?: yes Grab bars in the bathtub or shower?: yes Have non-skid surface in bathtub or shower?: (!) no Good home lighting?: yes Regular seat belt use?: yes Hospital stays in the last year:: no  Cognitive Assessment Difficulty concentrating, remembering, or making decisions? : no Will 6CIT or Mini Cog be Completed: yes What year is it?: 0 points What month is it?: 0 points Give patient an address phrase to remember (5 components): [address] [place] About what time is it?: 0 points Count backwards from 20 to 1: 0 points Say the months of the year in reverse: 0 points Repeat the address phrase from earlier: 0 points 6 CIT Score: 0 points  Advance Directives (For Healthcare) Does Patient Have a Medical Advance Directive?: No Would patient like information on creating a medical advance directive?: No - Patient declined  Reviewed/Updated  Reviewed/Updated: Reviewed All (Medical, Surgical, Family, Medications, Allergies, Care Teams, Patient Goals)    Allergies (verified) Lisinopril and Losartan    Current Medications (verified) Outpatient Encounter Medications as of 05/11/2024  Medication Sig   amLODipine  (NORVASC )  5 MG tablet TAKE 1  TABLET BY MOUTH DAILY   hydrochlorothiazide  (HYDRODIURIL ) 25 MG tablet TAKE 1 TABLET BY MOUTH DAILY   levothyroxine  (SYNTHROID ) 75 MCG tablet TAKE 1 TABLET BY MOUTH DAILY   Misc. Devices MISC Blood pressure cuff/device - 1. ICD10: I10   Multiple Vitamins-Minerals (HAIR SKIN AND NAILS FORMULA) TABS Take 2 tablets by mouth daily.   rosuvastatin  (CRESTOR ) 5 MG tablet TAKE 1 TABLET BY MOUTH DAILY   Semaglutide  (RYBELSUS ) 14 MG TABS Take 1 tablet (14 mg total) by mouth daily.   Cholecalciferol (VITAMIN D3) 125 MCG (5000 UT) CAPS Take 2 capsules by mouth daily.  (Patient not taking: Reported on 05/11/2024)   No facility-administered encounter medications on file as of 05/11/2024.    History: Past Medical History:  Diagnosis Date   Arthritis    knee , ankle and back   Bronchitis    Cataract    Diabetes mellitus without complication (HCC) 02/10/2019   Hyperlipidemia    Hypertension    Hypothyroidism, adult 02/10/2019   Neuralgia, post-herpetic 11/13/2016   Obesity (BMI 30.0-34.9) 02/10/2019   Thyroid  disease    Vitamin D  deficiency disease 02/10/2019   Past Surgical History:  Procedure Laterality Date   ABDOMINAL HYSTERECTOMY     fibroid   ANTERIOR HIP REVISION Right 10/24/2021   Procedure: RIGHT ANTERIOR HIP REVISION;  Surgeon: Vernetta Lonni GRADE, MD;  Location: [facility] OR;  Service: Orthopedics;  Laterality: Right;   BREAST BIOPSY Right 07/31/2017   lumpectomy for papilloma   BREAST SURGERY Right    COLONOSCOPY  09/13/2011   Procedure: COLONOSCOPY;  Surgeon: Claudis RAYMOND Rivet, MD;  Location: [facility];  Service: Endoscopy;  Laterality: N/A;  1030   COLONOSCOPY N/A 07/31/2018   Procedure: COLONOSCOPY;  Surgeon: Rivet Claudis RAYMOND, MD;  Location: [facility];  Service: Endoscopy;  Laterality: N/A;   COLONOSCOPY N/A 08/26/2023   Procedure: COLONOSCOPY;  Surgeon: Cinderella Deatrice FALCON, MD;  Location: [facility];  Service: Endoscopy;  Laterality: N/A;  9:45AM;ASA 1-2   POLYPECTOMY   07/31/2018   Procedure: POLYPECTOMY;  Surgeon: Rivet Claudis RAYMOND, MD;  Location: [facility];  Service: Endoscopy;;   TOTAL HIP ARTHROPLASTY Right 12/13/2017   Procedure: RIGHT TOTAL HIP ARTHROPLASTY ANTERIOR APPROACH;  Surgeon: Vernetta Lonni GRADE, MD;  Location: [facility];  Service: Orthopedics;  Laterality: Right;   Family History  Problem Relation Age of Onset   Stroke Mother    Diabetes Mother    Alcohol abuse Father    Early death Father    Early death Sister        pneumonia   Cancer Brother    Alzheimer's disease Sister    COPD Brother    Colon cancer Neg Hx    Social History   Occupational History   Occupation: disability  Tobacco Use   Smoking status: Former    Current packs/day: 0.00    Average packs/day: 0.5 packs/day for 3.0 years (1.5 ttl pk-yrs)    Types: Cigarettes    Start date: 43    Quit date: 1981    Years since quitting: 45.0   Smokeless tobacco: Never  Vaping Use   Vaping status: Never Used  Substance and Sexual Activity   Alcohol use: No   Drug use: Yes    Types: Marijuana   Sexual activity: Never    Birth control/protection: Surgical   Tobacco Counseling Counseling given: Not Answered  SDOH Screenings   Food Insecurity: No Food Insecurity (05/11/2024)  Housing: Low Risk (05/11/2024)  Transportation Needs: No Transportation Needs (05/11/2024)  Utilities: Not At Risk (05/11/2024)  Alcohol Screen: Low Risk (05/08/2023)  Depression (PHQ2-9): Low Risk (05/11/2024)  Financial Resource Strain: Low Risk (12/21/2023)  Physical Activity: Patient Declined (05/11/2024)  Social Connections: Moderately Isolated (05/11/2024)  Stress: No Stress Concern Present (05/11/2024)  Tobacco Use: Medium Risk (05/11/2024)  Health Literacy: Adequate Health Literacy (05/11/2024)   See flowsheets for full screening details  Depression Screen PHQ 2 & 9 Depression Scale- Over the past 2 weeks, how often have you been bothered by any of the following  problems? Little interest or pleasure in doing things: 0 Feeling down, depressed, or hopeless (PHQ Adolescent also includes...irritable): 0 PHQ-2 Total Score: 0 Trouble falling or staying asleep, or sleeping too much: 0 Feeling tired or having little energy: 0 Poor appetite or overeating (PHQ Adolescent also includes...weight loss): 0 Feeling bad about yourself - or that you are a failure or have let yourself or your family down: 0 Trouble concentrating on things, such as reading the newspaper or watching television (PHQ Adolescent also includes...like school work): 0 Moving or speaking so slowly that other people could have noticed. Or the opposite - being so fidgety or restless that you have been moving around a lot more than usual: 0 Thoughts that you would be better off dead, or of hurting yourself in some way: 0 PHQ-9 Total Score: 0 If you checked off any problems, how difficult have these problems made it for you to do your work, take care of things at home, or get along with other people?: Not difficult at all  Depression Treatment Depression Interventions/Treatment : EYV7-0 Score <4 Follow-up Not Indicated     Goals Addressed               This Visit's Progress     Remain active and healthy (pt-stated)               Objective:    Today's Vitals   05/11/24 1108  Weight: 201 lb (91.2 kg)  Height: 5' 8 (1.727 m)   Body mass index is 30.56 kg/m.  Hearing/Vision screen Hearing Screening - Comments:: Patient denies any hearing difficulties.   Vision Screening - Comments:: Patient states due to insurance she will have to find a new eye doctor within network. List of providers in [place] and [place] provided for patient.  Immunizations and Health Maintenance Health Maintenance  Topic Date Due   Zoster Vaccines- Shingrix (1 of 2) Never done   DTaP/Tdap/Td (2 - Tdap) 05/22/2019   COVID-19 Vaccine (3 - Moderna risk series) 12/17/2019   OPHTHALMOLOGY EXAM  05/18/2022    Medicare Annual Wellness (AWV)  05/07/2024   Diabetic kidney evaluation - Urine ACR  06/25/2024   HEMOGLOBIN A1C  07/10/2024   Bone Density Scan  08/27/2024   Mammogram  10/20/2024   Diabetic kidney evaluation - eGFR measurement  01/07/2025   FOOT EXAM  01/07/2025   Pneumococcal Vaccine: 50+ Years (3 of 3 - PCV20 or PCV21) 04/11/2025   Colonoscopy  08/25/2028   Influenza Vaccine  Completed   Hepatitis C Screening  Completed   Meningococcal B Vaccine  Aged Out        Assessment/Plan:  This is a routine wellness examination for [place].  Patient Care Team: Tobie Suzzane POUR, MD as PCP - General (Internal Medicine) Darroll Anes, DO (Optometry) Hillman Lin SAUNDERS, MD as Referring Physician (Neurosurgery) Clary Iha, NP as Nurse Practitioner  I have  personally reviewed and noted the following in the patients chart:   Medical and social history Use of alcohol, tobacco or illicit drugs  Current medications and supplements including opioid prescriptions. Functional ability and status Nutritional status Physical activity Advanced directives List of other physicians Hospitalizations, surgeries, and ER visits in previous 12 months Vitals Screenings to include cognitive, depression, and falls Referrals and appointments  No orders of the defined types were placed in this encounter.  In addition, I have reviewed and discussed with patient certain preventive protocols, quality metrics, and best practice recommendations. A written personalized care plan for preventive services as well as general preventive health recommendations were provided to patient.   Isley Weisheit, CMA   05/11/2024   Return on May 12, 2025 at 9:20 am, for your yearly Medicare Wellness Visit in person.  After Visit Summary: (Mail) Due to this being a telephonic visit, the after visit summary with patients personalized plan was offered to patient via mail   "

## 2024-05-25 ENCOUNTER — Other Ambulatory Visit: Payer: Self-pay | Admitting: Internal Medicine

## 2024-05-25 DIAGNOSIS — I10 Essential (primary) hypertension: Secondary | ICD-10-CM

## 2024-05-25 DIAGNOSIS — E039 Hypothyroidism, unspecified: Secondary | ICD-10-CM

## 2024-07-13 ENCOUNTER — Encounter: Admitting: Internal Medicine

## 2025-05-12 ENCOUNTER — Ambulatory Visit: Payer: Self-pay
